# Patient Record
Sex: Female | Born: 1955 | Race: White | Hispanic: Yes | Marital: Married | Smoking: Never smoker
Health system: Southern US, Community
[De-identification: ages and names within clinical notes are randomized; demographics above are authoritative.]

## PROBLEM LIST (undated history)

## (undated) DIAGNOSIS — T8859XA Other complications of anesthesia, initial encounter: Secondary | ICD-10-CM

## (undated) DIAGNOSIS — I10 Essential (primary) hypertension: Secondary | ICD-10-CM

## (undated) DIAGNOSIS — E785 Hyperlipidemia, unspecified: Secondary | ICD-10-CM

## (undated) DIAGNOSIS — M8580 Other specified disorders of bone density and structure, unspecified site: Secondary | ICD-10-CM

## (undated) DIAGNOSIS — M1990 Unspecified osteoarthritis, unspecified site: Secondary | ICD-10-CM

## (undated) DIAGNOSIS — H3530 Unspecified macular degeneration: Secondary | ICD-10-CM

## (undated) DIAGNOSIS — T4145XA Adverse effect of unspecified anesthetic, initial encounter: Secondary | ICD-10-CM

## (undated) HISTORY — DX: Unspecified macular degeneration: H35.30

## (undated) HISTORY — DX: Other specified disorders of bone density and structure, unspecified site: M85.80

## (undated) HISTORY — PX: BUNIONECTOMY: SHX129

## (undated) HISTORY — DX: Unspecified osteoarthritis, unspecified site: M19.90

## (undated) HISTORY — DX: Hyperlipidemia, unspecified: E78.5

## (undated) HISTORY — PX: OSTEOTOMY: SHX137

## (undated) HISTORY — DX: Essential (primary) hypertension: I10

---

## 1975-11-18 HISTORY — PX: HERNIA REPAIR: SHX51

## 2012-12-01 ENCOUNTER — Ambulatory Visit (INDEPENDENT_AMBULATORY_CARE_PROVIDER_SITE_OTHER): Payer: BC Managed Care – PPO | Admitting: Family

## 2012-12-01 ENCOUNTER — Telehealth: Payer: Self-pay | Admitting: Family

## 2012-12-01 ENCOUNTER — Encounter: Payer: Self-pay | Admitting: Family

## 2012-12-01 VITALS — BP 120/80 | HR 64 | Temp 98.3°F | Resp 16 | Ht 64.0 in | Wt 155.0 lb

## 2012-12-01 DIAGNOSIS — D219 Benign neoplasm of connective and other soft tissue, unspecified: Secondary | ICD-10-CM

## 2012-12-01 DIAGNOSIS — M1990 Unspecified osteoarthritis, unspecified site: Secondary | ICD-10-CM | POA: Insufficient documentation

## 2012-12-01 DIAGNOSIS — I10 Essential (primary) hypertension: Secondary | ICD-10-CM | POA: Insufficient documentation

## 2012-12-01 DIAGNOSIS — E785 Hyperlipidemia, unspecified: Secondary | ICD-10-CM | POA: Insufficient documentation

## 2012-12-01 DIAGNOSIS — D259 Leiomyoma of uterus, unspecified: Secondary | ICD-10-CM | POA: Insufficient documentation

## 2012-12-01 LAB — BASIC METABOLIC PANEL WITH GFR
BUN: 23 mg/dL (ref 6–23)
CO2: 26 mEq/L (ref 19–32)
Calcium: 9.7 mg/dL (ref 8.4–10.5)
Chloride: 103 mEq/L (ref 96–112)
Creat: 0.88 mg/dL (ref 0.50–1.10)
GFR, Est African American: 84 mL/min
GFR, Est Non African American: 73 mL/min
Glucose, Bld: 86 mg/dL (ref 70–99)
Potassium: 4.6 mEq/L (ref 3.5–5.3)
Sodium: 138 mEq/L (ref 135–145)

## 2012-12-01 LAB — HEPATIC FUNCTION PANEL
ALT: 12 U/L (ref 0–35)
AST: 15 U/L (ref 0–37)
Albumin: 4.3 g/dL (ref 3.5–5.2)
Alkaline Phosphatase: 54 U/L (ref 39–117)
Bilirubin, Direct: 0.1 mg/dL (ref 0.0–0.3)
Indirect Bilirubin: 0.2 mg/dL (ref 0.0–0.9)
Total Bilirubin: 0.3 mg/dL (ref 0.3–1.2)
Total Protein: 6.8 g/dL (ref 6.0–8.3)

## 2012-12-01 MED ORDER — LISINOPRIL 20 MG PO TABS: 20.0000 mg | ORAL_TABLET | Freq: Every day | ORAL | Status: DC

## 2012-12-01 MED ORDER — ESTRADIOL-NORETHINDRONE ACET 1-0.5 MG PO TABS: 1.0000 | ORAL_TABLET | Freq: Every day | ORAL | Status: DC

## 2012-12-01 NOTE — Assessment & Plan Note (Signed)
Tolerating statin- obtain lft.  Check flp next visit when fasting.

## 2012-12-01 NOTE — Assessment & Plan Note (Addendum)
BP stable on lisinopril. Obtain bmet, continue same.

## 2012-12-01 NOTE — Telephone Encounter (Signed)
Pls call pt and let her know that I have sent Mimvey rx to her pharmacy.  I would like to arrange for her to establish with local GYN due to hx of fibroid uterus and for ongoing management of her hormone replacement.

## 2012-12-01 NOTE — Assessment & Plan Note (Signed)
D/C BC powder and aleve.  Recommend tylenol arthritis prn.  Refer to ortho due to hip pain.

## 2012-12-01 NOTE — Patient Instructions (Addendum)
Please complete your lab work prior to leaving. You will be contact about your referral to orthopedics.   Please let us know if you have not heard back within 1 week about your referral. Please stop BC and aleve. You may use tylenol arthritis as needed. Schedule a fasting physical at your convenience. Welcome to [facility]!

## 2012-12-01 NOTE — Assessment & Plan Note (Signed)
Will refer to GYN for ongoing monitoring and HRT.  See phone note.

## 2012-12-01 NOTE — Assessment & Plan Note (Signed)
>>  ASSESSMENT AND PLAN FOR OSTEOARTHRITIS WRITTEN ON 12/01/2012 10:06 PM BY O'SULLIVAN, Malina Geers, NP  D/C BC powder and aleve .  Recommend tylenol  arthritis prn.  Refer to ortho due to hip pain.

## 2012-12-01 NOTE — Progress Notes (Signed)
Subjective:    Patient ID: Melissa Grimes, female    DOB: [birth year], 57 y.o.   MRN: [id number]  HPI  [name] is a 57 yr old female who presents today to establish care. Moved from [place] in September.  Husband's job.   1) HTN- currently maintained on lisinopril. Reports that she has been treated for HTN for 8-9 years.   2) Hyperlipidemia- currently on pravastatin. Not sure of dose.  She has been on this x 1 year.  Denies myalgia.  Had myalgia on simvastatin.   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  She has seen ortho about 2 yrs ago.  Was told to take aleve.  In the last year symptoms have worsened.  Worse with weight bearing. Does some exercises recommended by PT.    4) Fibroid Uterus-  Reports that the last 2 years she has every 3 months she develops severe cramping x 4 days.  She did have episode of menstrual bleeding 18 months ago. She had D and C and was told that she had fibroid tumor.  She uses naproxen prn.  Sometimes uses BC powder.   Review of Systems  Constitutional: Negative for unexpected weight change.  HENT: Negative for hearing loss.   Eyes: Negative for visual disturbance.  Respiratory: Negative for cough and shortness of breath.   Cardiovascular: Negative for chest pain.  Gastrointestinal: Negative for nausea, vomiting, diarrhea and constipation.  Genitourinary: Negative for dysuria and frequency.  Musculoskeletal: Positive for arthralgias.  Skin: Negative for rash.  Neurological: Negative for headaches.  Hematological: Negative for adenopathy.  Psychiatric/Behavioral:       Denies depression/anxiety   Past Medical History  Diagnosis Date  . Arthritis   . Hyperlipidemia   . Hypertension     History   Social History  . Marital Status: Married    Spouse Name: N/A    Number of Children: 2  . Years of Education: N/A   Occupational History  . Not on file.   Social History Main Topics  . Smoking status: Never Smoker   . Smokeless tobacco: Never Used  .  Alcohol Use: 4.2 oz/week    7 Glasses of wine per week  . Drug Use: Not on file  . Sexually Active: Not on file   Other Topics Concern  . 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 Osteotomy 1974 & 1975  . Cesarean section     x 2  . Hernia repair 1977    inguinal    Family History  Problem Relation Age of Onset  . Arthritis Mother     osateoarthritis  . Hyperlipidemia Mother   . Heart disease Mother   . Hypertension Mother   . Hyperlipidemia Father   . Heart disease Father   . Stroke Father   . Parkinson's disease Father   . Cancer Maternal Grandfather     lung  . Cancer Cousin     breast    Allergies  Allergen Reactions  . Other Hives    ACRIMYACIN? (abx) Pt states she is able to take erythromycins.    Current Outpatient Prescriptions on File Prior to Visit  Medication Sig Dispense Refill  . Calcium Citrate-Vitamin D (CITRACAL + D PO) Take 2 tablets by mouth 2 (two) times daily.      Marland Kitchen lisinopril (  PRINIVIL,ZESTRIL) 20 MG tablet Take 1 tablet (20 mg total) by mouth daily.  30 tablet  5  . MIMVEY 1-0.5 MG per tablet Take 1 tablet by mouth daily.      Marland Kitchen PRAVASTATIN SODIUM PO Take 1 tablet by mouth every evening.        BP 120/80  Pulse 64  Temp 98.3 F (36.8 C) (Oral)  Resp 16  Ht 5\' 4"  (1.626 m)  Wt 155 lb (70.308 kg)  BMI 26.61 kg/m2  SpO2 98%  LMP 11/17/2010       Objective:   Physical Exam  Constitutional: She is oriented to person, place, and time. She appears well-developed and well-nourished. No distress.  HENT:  Head: Normocephalic and atraumatic.  Eyes: No scleral icterus.  Cardiovascular: Normal rate and regular rhythm.   No murmur heard. Pulmonary/Chest: Effort normal and breath sounds normal. No respiratory distress. She has no wheezes. She has no rales.  She exhibits no tenderness.  Abdominal: Soft. Bowel sounds are normal.  Musculoskeletal: She exhibits no edema.  Neurological: She is alert and oriented to person, place, and time.  Skin: Skin is warm and dry.  Psychiatric: She has a normal mood and affect. Her behavior is normal. Judgment and thought content normal.          Assessment & Plan:

## 2012-12-03 ENCOUNTER — Telehealth: Payer: Self-pay | Admitting: *Deleted

## 2012-12-03 NOTE — Telephone Encounter (Signed)
Notified pt and she states she is in the process of establishing with local GYN; referral cancelled.

## 2012-12-03 NOTE — Telephone Encounter (Signed)
Received call from pt wanting to update her medication list. States she is taking pravastatin 20mg  at bedtime. Does not need refill at this time as she still has 4 refills left on her previous Rx. Med list updated.

## 2012-12-08 ENCOUNTER — Telehealth: Payer: Self-pay | Admitting: Family

## 2012-12-08 NOTE — Telephone Encounter (Signed)
Received medical records from [facility]   P: [phone] F: [phone]

## 2012-12-14 ENCOUNTER — Telehealth: Payer: Self-pay | Admitting: Family

## 2012-12-14 NOTE — Telephone Encounter (Signed)
Please call pt and let her know that I received a request for surgical clearance from [facility] ortho.  She should schedule a visit for pre-op clearance please.

## 2012-12-15 NOTE — Telephone Encounter (Signed)
Patient states that she does not have a set date for surgery yet. She is keeping the appointment for 12/22/12.

## 2012-12-22 ENCOUNTER — Encounter: Payer: Self-pay | Admitting: Family

## 2012-12-22 ENCOUNTER — Ambulatory Visit (INDEPENDENT_AMBULATORY_CARE_PROVIDER_SITE_OTHER): Payer: BC Managed Care – PPO | Admitting: Family

## 2012-12-22 ENCOUNTER — Ambulatory Visit (HOSPITAL_BASED_OUTPATIENT_CLINIC_OR_DEPARTMENT_OTHER)
Admission: RE | Admit: 2012-12-22 | Discharge: 2012-12-22 | Disposition: A | Discharge status: Home / Self Care | Payer: BC Managed Care – PPO | Source: Ambulatory Visit | Attending: Family | Admitting: Family

## 2012-12-22 VITALS — BP 114/76 | HR 66 | Temp 98.5°F | Resp 16 | Ht 64.0 in | Wt 153.0 lb

## 2012-12-22 DIAGNOSIS — M1990 Unspecified osteoarthritis, unspecified site: Secondary | ICD-10-CM

## 2012-12-22 DIAGNOSIS — I10 Essential (primary) hypertension: Secondary | ICD-10-CM | POA: Insufficient documentation

## 2012-12-22 DIAGNOSIS — Z0000 Encounter for general adult medical examination without abnormal findings: Secondary | ICD-10-CM | POA: Insufficient documentation

## 2012-12-22 DIAGNOSIS — Z23 Encounter for immunization: Secondary | ICD-10-CM

## 2012-12-22 DIAGNOSIS — Z01818 Encounter for other preprocedural examination: Secondary | ICD-10-CM

## 2012-12-22 DIAGNOSIS — E785 Hyperlipidemia, unspecified: Secondary | ICD-10-CM | POA: Insufficient documentation

## 2012-12-22 LAB — CBC WITH DIFFERENTIAL/PLATELET
Basophils Absolute: 0.1 10*3/uL (ref 0.0–0.1)
Basophils Relative: 1 % (ref 0–1)
Eosinophils Absolute: 0.2 10*3/uL (ref 0.0–0.7)
Eosinophils Relative: 3 % (ref 0–5)
HCT: 41.6 % (ref 36.0–46.0)
Hemoglobin: 14.2 g/dL (ref 12.0–15.0)
Lymphocytes Relative: 30 % (ref 12–46)
Lymphs Abs: 1.8 10*3/uL (ref 0.7–4.0)
MCH: 31.7 pg (ref 26.0–34.0)
MCHC: 34.1 g/dL (ref 30.0–36.0)
MCV: 92.9 fL (ref 78.0–100.0)
Monocytes Absolute: 0.3 10*3/uL (ref 0.1–1.0)
Monocytes Relative: 5 % (ref 3–12)
Neutro Abs: 3.7 10*3/uL (ref 1.7–7.7)
Neutrophils Relative %: 61 % (ref 43–77)
Platelets: 362 10*3/uL (ref 150–400)
RBC: 4.48 MIL/uL (ref 3.87–5.11)
RDW: 12.7 % (ref 11.5–15.5)
WBC: 6.1 10*3/uL (ref 4.0–10.5)

## 2012-12-22 LAB — LIPID PANEL
Cholesterol: 189 mg/dL (ref 0–200)
HDL: 56 mg/dL (ref >39)
LDL Cholesterol: 104 mg/dL — ABNORMAL HIGH (ref 0–99)
Total CHOL/HDL Ratio: 3.4 Ratio
Triglycerides: 144 mg/dL (ref <150)
VLDL: 29 mg/dL (ref 0–40)

## 2012-12-22 LAB — TSH: TSH: 1.501 u[IU]/mL (ref 0.350–4.500)

## 2012-12-22 LAB — PROTIME-INR
INR: 0.93 (ref <1.50)
Prothrombin Time: 12.5 seconds (ref 11.6–15.2)

## 2012-12-22 NOTE — Progress Notes (Signed)
Subjective:    Patient ID: Melissa Grimes, female    DOB: 1956-11-15, 57 y.o.   MRN: [id number]  HPI  Ms.  Lahm is  A 57 yr old female who presents today for fasting physical and pre-operative clearance for R THR.    Immunizations: Not sure of last tetanus but thinks it is up to date.  Diet: reports healthy diet Exercise: She continues to ride bike and do some exercises from PT.   Colonoscopy: 2008- normal.  Pt apparently had complications following colo and was told "never to do again." Dexa: last 5 yrs ago Pap Smear: 5/13 Mammogram: 5/13  Review of Systems  Constitutional: Negative for unexpected weight change.  HENT: Negative for hearing loss.   Eyes: Negative for visual disturbance.  Respiratory: Negative for cough.   Cardiovascular: Negative for chest pain and leg swelling.  Gastrointestinal: Negative for nausea, vomiting and diarrhea.  Genitourinary: Negative for dysuria and frequency.  Musculoskeletal: Positive for arthralgias.  Skin: Negative for rash.  Neurological: Negative for headaches.  Hematological: Negative for adenopathy. Does not bruise/bleed easily.  Psychiatric/Behavioral:       Denies depression   Past Medical History  Diagnosis Date  . Arthritis   . Hyperlipidemia   . Hypertension     History   Social History  . Marital Status: Married    Spouse Name: N/A    Number of Children: 2  . Years of Education: N/A   Occupational History  . Not on file.   Social History Main Topics  . Smoking status: Never Smoker   . Smokeless tobacco: Never Used  . Alcohol Use: 4.2 oz/week    7 Glasses of wine per week  . Drug Use: Not on file  . Sexually Active: Not on file   Other Topics Concern  . 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 Osteotomy 1974 & 1975  .  Cesarean section     x 2  . Hernia repair 1977    inguinal    Family History  Problem Relation Age of Onset  . Arthritis Mother     osateoarthritis  . Hyperlipidemia Mother   . Heart disease Mother   . Hypertension Mother   . Hyperlipidemia Father   . Heart disease Father   . Stroke Father   . Parkinson's disease Father   . Cancer Maternal Grandfather     lung  . Cancer Cousin     breast    Allergies  Allergen Reactions  . Other Hives    ACRIMYACIN? (abx) Pt states she is able to take erythromycins.    Current Outpatient Prescriptions on File Prior to Visit  Medication Sig Dispense Refill  . Calcium Citrate-Vitamin D (CITRACAL + D PO) Take 2 tablets by mouth 2 (two) times daily.      Marland Kitchen estradiol-norethindrone (MIMVEY) 1-0.5 MG per tablet Take 1 tablet by mouth daily.  30 tablet  5  . Lactobacillus (ACIDOPHILUS PO) Take 1 capsule by mouth daily.      Marland Kitchen lisinopril (PRINIVIL,ZESTRIL) 20 MG tablet Take 1 tablet (20 mg total) by mouth daily.  30 tablet  5  . Misc Natural Products (TART CHERRY ADVANCED) CAPS Take 3 capsules by mouth daily.      . Multiple Vitamins-Minerals (CENTRUM SILVER ADULT 50+) TABS Take 1 tablet by mouth  daily.      . Omega-3 Fatty Acids (FISH OIL) 1200 MG CAPS Take 1 capsule by mouth daily.      Marland Kitchen PRAVASTATIN SODIUM PO Take 20 mg by mouth at bedtime.         BP 114/76  Pulse 66  Temp 98.5 F (36.9 C) (Oral)  Resp 16  Ht 5\' 4"  (1.626 m)  Wt 153 lb 0.6 oz (69.418 kg)  BMI 26.27 kg/m2  SpO2 97%  LMP 11/17/2010        Objective:   Physical Exam Physical Exam  Constitutional: She is oriented to person, place, and time. She appears well-developed and well-nourished. No distress.  HENT:  Head: Normocephalic and atraumatic.  Right Ear: Tympanic membrane and ear canal normal.  Left Ear: Tympanic membrane and ear canal normal.  Mouth/Throat: Oropharynx is clear and moist.  Eyes: Pupils are equal, round, and reactive to light. No scleral  icterus.  Neck: Normal range of motion. No thyromegaly present.  Cardiovascular: Normal rate and regular rhythm.   No murmur heard. Pulmonary/Chest: Effort normal and breath sounds normal. No respiratory distress. He has no wheezes. She has no rales. She exhibits no tenderness.  Abdominal: Soft. Bowel sounds are normal. He exhibits no distension and no mass. There is no tenderness. There is no rebound and no guarding.  Musculoskeletal: She exhibits no edema.  Lymphadenopathy:    She has no cervical adenopathy.  Neurological: She is alert and oriented to person, place, and time. She has normal reflexes. She exhibits normal muscle tone. Coordination normal.  Skin: Skin is warm and dry.  Psychiatric: She has a normal mood and affect. Her behavior is normal. Judgment and thought content normal.  Breast/pelvic- deferred to gyn.          Assessment & Plan:          Assessment & Plan:

## 2012-12-22 NOTE — Assessment & Plan Note (Signed)
Obtain fasting lab work, Tdap today, EKG, dexa.  Continue healthy diet and exercise.

## 2012-12-22 NOTE — Assessment & Plan Note (Signed)
>>  ASSESSMENT AND PLAN FOR OSTEOARTHRITIS WRITTEN ON 12/22/2012  9:44 AM BY O'SULLIVAN, Christopher Glasscock, NP  For R THR.  Obtain CXR, PT/INR in addition to cpx labs to complete pre-operative evaluation.

## 2012-12-22 NOTE — Patient Instructions (Addendum)
Please complete your blood work prior to leaving. Complete chest x ray on the first floor. Schedule bone density at the front desk. Follow up in 6 months.

## 2012-12-22 NOTE — Assessment & Plan Note (Signed)
For R THR.  Obtain CXR, PT/INR in addition to cpx labs to complete pre-operative evaluation.

## 2012-12-23 ENCOUNTER — Telehealth: Payer: Self-pay | Admitting: Family

## 2012-12-23 ENCOUNTER — Encounter: Payer: Self-pay | Admitting: Family

## 2012-12-23 LAB — URINALYSIS, ROUTINE W REFLEX MICROSCOPIC
Bilirubin Urine: NEGATIVE
Glucose, UA: NEGATIVE mg/dL
Hgb urine dipstick: NEGATIVE
Ketones, ur: NEGATIVE mg/dL
Leukocytes, UA: NEGATIVE
Nitrite: NEGATIVE
Protein, ur: NEGATIVE mg/dL
Specific Gravity, Urine: 1.016 (ref 1.005–1.030)
Urobilinogen, UA: 0.2 mg/dL (ref 0.0–1.0)
pH: 7.5 (ref 5.0–8.0)

## 2012-12-23 NOTE — Telephone Encounter (Signed)
Attempted to reach pt and received message that voice mailbox has not been set up. Form faxed.

## 2012-12-23 NOTE — Telephone Encounter (Signed)
Pt is cleared for surgery. Please let her know and fax clearance to ortho.

## 2012-12-24 NOTE — Telephone Encounter (Signed)
Left detailed message on pt's cell# re: from completion and to call if any questions.

## 2012-12-27 ENCOUNTER — Other Ambulatory Visit (HOSPITAL_COMMUNITY): Payer: Self-pay | Admitting: Orthopaedic Surgery

## 2012-12-28 ENCOUNTER — Other Ambulatory Visit: Payer: BC Managed Care – PPO

## 2012-12-28 LAB — FECAL OCCULT BLOOD, IMMUNOCHEMICAL: Fecal Occult Bld: NEGATIVE

## 2012-12-29 ENCOUNTER — Encounter: Payer: Self-pay | Admitting: Family

## 2013-01-07 ENCOUNTER — Encounter (HOSPITAL_COMMUNITY): Payer: Self-pay | Admitting: Pharmacy Technician

## 2013-01-11 ENCOUNTER — Encounter (HOSPITAL_COMMUNITY): Payer: Self-pay

## 2013-01-11 ENCOUNTER — Encounter (HOSPITAL_COMMUNITY)
Admission: RE | Admit: 2013-01-11 | Discharge: 2013-01-11 | Disposition: A | Discharge status: Home / Self Care | Payer: BC Managed Care – PPO | Source: Ambulatory Visit | Attending: Orthopaedic Surgery | Admitting: Orthopaedic Surgery

## 2013-01-11 LAB — BASIC METABOLIC PANEL
BUN: 17 mg/dL (ref 6–23)
CO2: 25 mEq/L (ref 19–32)
Calcium: 9.2 mg/dL (ref 8.4–10.5)
Chloride: 103 mEq/L (ref 96–112)
Creatinine, Ser: 0.64 mg/dL (ref 0.50–1.10)
GFR calc Af Amer: >90 mL/min (ref >90)
GFR calc non Af Amer: >90 mL/min (ref >90)
Glucose, Bld: 94 mg/dL (ref 70–99)
Potassium: 3.6 mEq/L (ref 3.5–5.1)
Sodium: 139 mEq/L (ref 135–145)

## 2013-01-11 LAB — SURGICAL PCR SCREEN
MRSA, PCR: NEGATIVE
Staphylococcus aureus: NEGATIVE

## 2013-01-11 LAB — URINALYSIS, ROUTINE W REFLEX MICROSCOPIC
Bilirubin Urine: NEGATIVE
Glucose, UA: NEGATIVE mg/dL
Ketones, ur: NEGATIVE mg/dL
Leukocytes, UA: NEGATIVE
Nitrite: NEGATIVE
Protein, ur: NEGATIVE mg/dL
Specific Gravity, Urine: 1.019 (ref 1.005–1.030)
Urobilinogen, UA: 0.2 mg/dL (ref 0.0–1.0)
pH: 5.5 (ref 5.0–8.0)

## 2013-01-11 LAB — TYPE AND SCREEN
ABO/RH(D): A POS
Antibody Screen: NEGATIVE

## 2013-01-11 LAB — URINE MICROSCOPIC-ADD ON

## 2013-01-11 LAB — APTT: aPTT: 28 seconds (ref 24–37)

## 2013-01-11 LAB — CBC
HCT: 40.3 % (ref 36.0–46.0)
Hemoglobin: 14.4 g/dL (ref 12.0–15.0)
MCH: 33.1 pg (ref 26.0–34.0)
MCHC: 35.7 g/dL (ref 30.0–36.0)
MCV: 92.6 fL (ref 78.0–100.0)
Platelets: 326 10*3/uL (ref 150–400)
RBC: 4.35 MIL/uL (ref 3.87–5.11)
RDW: 12.4 % (ref 11.5–15.5)
WBC: 7.7 10*3/uL (ref 4.0–10.5)

## 2013-01-11 LAB — PROTIME-INR
INR: 1.04 (ref 0.00–1.49)
Prothrombin Time: 13.5 seconds (ref 11.6–15.2)

## 2013-01-11 LAB — ABO/RH: ABO/RH(D): A POS

## 2013-01-11 NOTE — Pre-Procedure Instructions (Signed)
Melissa Grimes  01/11/2013   Your procedure is scheduled on:  Tuesday, January 18, 2013  Report to [facility] at 10:45 AM.  Call this number if you have problems the morning of surgery: [phone]   Remember:   Do not eat food or drink liquids after midnight.   Take these medicines the morning of surgery with A SIP OF WATER: estradiol-norethindrone   Do not wear jewelry, make-up or nail polish.  Do not wear lotions, powders, or perfumes. You may wear deodorant.  Do not shave 48 hours prior to surgery. Men may shave face and neck.  Do not bring valuables to the hospital.  Contacts, dentures or bridgework may not be worn into surgery.  Leave suitcase in the car. After surgery it may be brought to your room.  For patients admitted to the hospital, checkout time is 11:00 AM the day of  discharge.   Patients discharged the day of surgery will not be allowed to drive  home.  Name and phone number of your driver:   Special Instructions: Shower using CHG 2 nights before surgery and the night before surgery.  If you shower the day of surgery use CHG.  Use special wash - you have one bottle of CHG for all showers.  You should use approximately 1/3 of the bottle for each shower.   Please read over the following fact sheets that you were given: Pain Booklet, Coughing and Deep Breathing, Blood Transfusion Information, MRSA Information and Surgical Site Infection Prevention

## 2013-01-17 MED ORDER — CEFAZOLIN SODIUM-DEXTROSE 2-3 GM-% IV SOLR
2.0000 g | INTRAVENOUS | Status: AC
  Administered 2013-01-18: 2 g via INTRAVENOUS
  Filled 2013-01-17: qty 50

## 2013-01-18 ENCOUNTER — Encounter (HOSPITAL_COMMUNITY): Payer: Self-pay | Admitting: Anesthesiology

## 2013-01-18 ENCOUNTER — Inpatient Hospital Stay (HOSPITAL_COMMUNITY): Payer: BC Managed Care – PPO

## 2013-01-18 ENCOUNTER — Encounter (HOSPITAL_COMMUNITY)
Admission: RE | Disposition: A | Discharge status: Home Health | Payer: Self-pay | Source: Ambulatory Visit | Attending: Orthopaedic Surgery

## 2013-01-18 ENCOUNTER — Encounter (HOSPITAL_COMMUNITY): Payer: Self-pay | Admitting: Surgery

## 2013-01-18 ENCOUNTER — Inpatient Hospital Stay (HOSPITAL_COMMUNITY)
Admission: RE | Admit: 2013-01-18 | Discharge: 2013-01-22 | DRG: 818 | Disposition: A | Discharge status: Home Health | Payer: BC Managed Care – PPO | Source: Ambulatory Visit | Attending: Orthopaedic Surgery | Admitting: Orthopaedic Surgery

## 2013-01-18 ENCOUNTER — Ambulatory Visit (HOSPITAL_COMMUNITY): Payer: BC Managed Care – PPO | Admitting: Anesthesiology

## 2013-01-18 ENCOUNTER — Ambulatory Visit (HOSPITAL_COMMUNITY): Payer: BC Managed Care – PPO

## 2013-01-18 DIAGNOSIS — D259 Leiomyoma of uterus, unspecified: Secondary | ICD-10-CM | POA: Diagnosis present

## 2013-01-18 DIAGNOSIS — E785 Hyperlipidemia, unspecified: Secondary | ICD-10-CM | POA: Diagnosis present

## 2013-01-18 DIAGNOSIS — Z01812 Encounter for preprocedural laboratory examination: Secondary | ICD-10-CM

## 2013-01-18 DIAGNOSIS — I10 Essential (primary) hypertension: Secondary | ICD-10-CM | POA: Diagnosis present

## 2013-01-18 DIAGNOSIS — Z79899 Other long term (current) drug therapy: Secondary | ICD-10-CM

## 2013-01-18 DIAGNOSIS — Z7982 Long term (current) use of aspirin: Secondary | ICD-10-CM

## 2013-01-18 DIAGNOSIS — M1610 Unilateral primary osteoarthritis, unspecified hip: Principal | ICD-10-CM | POA: Diagnosis not present

## 2013-01-18 DIAGNOSIS — M169 Osteoarthritis of hip, unspecified: Secondary | ICD-10-CM

## 2013-01-18 DIAGNOSIS — D62 Acute posthemorrhagic anemia: Secondary | ICD-10-CM | POA: Diagnosis present

## 2013-01-18 HISTORY — PX: TOTAL HIP ARTHROPLASTY: SHX124

## 2013-01-18 SURGERY — ARTHROPLASTY, HIP, TOTAL, ANTERIOR APPROACH
Anesthesia: General | Laterality: Right | Wound class: Clean

## 2013-01-18 MED ORDER — HYDROMORPHONE HCL PF 1 MG/ML IJ SOLN
0.2500 mg | INTRAMUSCULAR | Status: DC | PRN
  Administered 2013-01-18 (×2): 0.5 mg via INTRAVENOUS

## 2013-01-18 MED ORDER — METHOCARBAMOL 500 MG PO TABS
500.0000 mg | ORAL_TABLET | Freq: Four times a day (QID) | ORAL | Status: DC | PRN
  Administered 2013-01-18 – 2013-01-22 (×10): 500 mg via ORAL
  Filled 2013-01-18 (×10): qty 1

## 2013-01-18 MED ORDER — EPHEDRINE SULFATE 50 MG/ML IJ SOLN
INTRAMUSCULAR | Status: DC | PRN
  Administered 2013-01-18 (×2): 5 mg via INTRAVENOUS

## 2013-01-18 MED ORDER — NEOSTIGMINE METHYLSULFATE 1 MG/ML IJ SOLN
INTRAMUSCULAR | Status: DC | PRN
  Administered 2013-01-18: 4 mg via INTRAVENOUS

## 2013-01-18 MED ORDER — ONDANSETRON HCL 4 MG/2ML IJ SOLN
INTRAMUSCULAR | Status: DC | PRN
  Administered 2013-01-18: 4 mg via INTRAVENOUS

## 2013-01-18 MED ORDER — DOCUSATE SODIUM 100 MG PO CAPS
100.0000 mg | ORAL_CAPSULE | Freq: Two times a day (BID) | ORAL | Status: DC
  Administered 2013-01-18 – 2013-01-22 (×8): 100 mg via ORAL
  Filled 2013-01-18 (×10): qty 1

## 2013-01-18 MED ORDER — ONDANSETRON HCL 4 MG PO TABS: 4.0000 mg | ORAL_TABLET | Freq: Four times a day (QID) | ORAL | Status: DC | PRN

## 2013-01-18 MED ORDER — LACTATED RINGERS IV SOLN
INTRAVENOUS | Status: DC
  Administered 2013-01-18: 12:00:00 via INTRAVENOUS

## 2013-01-18 MED ORDER — OXYCODONE HCL 5 MG PO TABS: 5.0000 mg | ORAL_TABLET | Freq: Once | ORAL | Status: DC | PRN

## 2013-01-18 MED ORDER — ROCURONIUM BROMIDE 100 MG/10ML IV SOLN
INTRAVENOUS | Status: DC | PRN
  Administered 2013-01-18: 50 mg via INTRAVENOUS

## 2013-01-18 MED ORDER — FENTANYL CITRATE 0.05 MG/ML IJ SOLN
INTRAMUSCULAR | Status: DC | PRN
  Administered 2013-01-18: 50 ug via INTRAVENOUS
  Administered 2013-01-18 (×3): 100 ug via INTRAVENOUS

## 2013-01-18 MED ORDER — SODIUM CHLORIDE 0.9 % IR SOLN
Status: DC | PRN
  Administered 2013-01-18: 1000 mL

## 2013-01-18 MED ORDER — HYDROMORPHONE HCL PF 1 MG/ML IJ SOLN
INTRAMUSCULAR | Status: AC
  Filled 2013-01-18: qty 1

## 2013-01-18 MED ORDER — ACETAMINOPHEN 325 MG PO TABS
650.0000 mg | ORAL_TABLET | Freq: Four times a day (QID) | ORAL | Status: DC | PRN
  Administered 2013-01-19: 650 mg via ORAL
  Filled 2013-01-18: qty 2

## 2013-01-18 MED ORDER — SODIUM CHLORIDE 0.9 % IV SOLN
INTRAVENOUS | Status: DC
  Administered 2013-01-18: via INTRAVENOUS

## 2013-01-18 MED ORDER — METOCLOPRAMIDE HCL 5 MG/ML IJ SOLN: 5.0000 mg | Freq: Three times a day (TID) | INTRAMUSCULAR | Status: DC | PRN

## 2013-01-18 MED ORDER — LACTATED RINGERS IV SOLN
INTRAVENOUS | Status: DC | PRN
  Administered 2013-01-18 (×3): via INTRAVENOUS

## 2013-01-18 MED ORDER — GLYCOPYRROLATE 0.2 MG/ML IJ SOLN
INTRAMUSCULAR | Status: DC | PRN
  Administered 2013-01-18: 0.6 mg via INTRAVENOUS

## 2013-01-18 MED ORDER — OXYCODONE HCL ER 10 MG PO T12A
20.0000 mg | EXTENDED_RELEASE_TABLET | Freq: Two times a day (BID) | ORAL | Status: DC
  Administered 2013-01-18 – 2013-01-19 (×2): 20 mg via ORAL
  Filled 2013-01-18 (×2): qty 2

## 2013-01-18 MED ORDER — PROMETHAZINE HCL 25 MG/ML IJ SOLN
6.2500 mg | INTRAMUSCULAR | Status: DC | PRN
  Administered 2013-01-18: 6.25 mg via INTRAVENOUS

## 2013-01-18 MED ORDER — ONDANSETRON HCL 4 MG/2ML IJ SOLN
4.0000 mg | Freq: Four times a day (QID) | INTRAMUSCULAR | Status: DC | PRN
  Administered 2013-01-19: 4 mg via INTRAVENOUS
  Filled 2013-01-18: qty 2

## 2013-01-18 MED ORDER — OXYCODONE HCL 5 MG PO TABS
5.0000 mg | ORAL_TABLET | ORAL | Status: DC | PRN
  Administered 2013-01-19 – 2013-01-22 (×11): 10 mg via ORAL
  Filled 2013-01-18 (×14): qty 2

## 2013-01-18 MED ORDER — DIPHENHYDRAMINE HCL 12.5 MG/5ML PO ELIX: 12.5000 mg | ORAL_SOLUTION | ORAL | Status: DC | PRN

## 2013-01-18 MED ORDER — METHOCARBAMOL 100 MG/ML IJ SOLN: 500.0000 mg | Freq: Four times a day (QID) | INTRAVENOUS | Status: DC | PRN

## 2013-01-18 MED ORDER — ALUM & MAG HYDROXIDE-SIMETH 200-200-20 MG/5ML PO SUSP: 30.0000 mL | ORAL | Status: DC | PRN

## 2013-01-18 MED ORDER — LIDOCAINE HCL 4 % MT SOLN
OROMUCOSAL | Status: DC | PRN
  Administered 2013-01-18: 4 mL via TOPICAL

## 2013-01-18 MED ORDER — ASPIRIN EC 325 MG PO TBEC
325.0000 mg | DELAYED_RELEASE_TABLET | Freq: Two times a day (BID) | ORAL | Status: DC
  Administered 2013-01-19 – 2013-01-22 (×7): 325 mg via ORAL
  Filled 2013-01-18 (×9): qty 1

## 2013-01-18 MED ORDER — 0.9 % SODIUM CHLORIDE (POUR BTL) OPTIME
TOPICAL | Status: DC | PRN
  Administered 2013-01-18: 1000 mL

## 2013-01-18 MED ORDER — ACETAMINOPHEN 650 MG RE SUPP: 650.0000 mg | Freq: Four times a day (QID) | RECTAL | Status: DC | PRN

## 2013-01-18 MED ORDER — METOCLOPRAMIDE HCL 10 MG PO TABS: 5.0000 mg | ORAL_TABLET | Freq: Three times a day (TID) | ORAL | Status: DC | PRN

## 2013-01-18 MED ORDER — KETOROLAC TROMETHAMINE 15 MG/ML IJ SOLN
15.0000 mg | Freq: Four times a day (QID) | INTRAMUSCULAR | Status: AC
  Administered 2013-01-18 – 2013-01-19 (×4): 15 mg via INTRAVENOUS
  Filled 2013-01-18 (×4): qty 1

## 2013-01-18 MED ORDER — PROPOFOL 10 MG/ML IV BOLUS
INTRAVENOUS | Status: DC | PRN
  Administered 2013-01-18: 100 mg via INTRAVENOUS

## 2013-01-18 MED ORDER — PHENOL 1.4 % MT LIQD: 1.0000 | OROMUCOSAL | Status: DC | PRN

## 2013-01-18 MED ORDER — CEFAZOLIN SODIUM 1-5 GM-% IV SOLN
1.0000 g | Freq: Four times a day (QID) | INTRAVENOUS | Status: AC
  Administered 2013-01-18 – 2013-01-19 (×2): 1 g via INTRAVENOUS
  Filled 2013-01-18 (×3): qty 50

## 2013-01-18 MED ORDER — PROMETHAZINE HCL 25 MG/ML IJ SOLN
INTRAMUSCULAR | Status: AC
  Filled 2013-01-18: qty 1

## 2013-01-18 MED ORDER — MENTHOL 3 MG MT LOZG: 1.0000 | LOZENGE | OROMUCOSAL | Status: DC | PRN

## 2013-01-18 MED ORDER — HYDROMORPHONE HCL PF 1 MG/ML IJ SOLN
1.0000 mg | INTRAMUSCULAR | Status: DC | PRN
  Administered 2013-01-18: 1 mg via INTRAVENOUS
  Filled 2013-01-18: qty 1

## 2013-01-18 MED ORDER — ZOLPIDEM TARTRATE 5 MG PO TABS: 5.0000 mg | ORAL_TABLET | Freq: Every evening | ORAL | Status: DC | PRN

## 2013-01-18 MED ORDER — MIDAZOLAM HCL 5 MG/5ML IJ SOLN
INTRAMUSCULAR | Status: DC | PRN
  Administered 2013-01-18: 2 mg via INTRAVENOUS

## 2013-01-18 MED ORDER — OXYCODONE HCL 5 MG/5ML PO SOLN: 5.0000 mg | Freq: Once | ORAL | Status: DC | PRN

## 2013-01-18 MED ORDER — LIDOCAINE HCL (CARDIAC) 20 MG/ML IV SOLN
INTRAVENOUS | Status: DC | PRN
  Administered 2013-01-18: 100 mg via INTRAVENOUS

## 2013-01-18 MED ORDER — METHOCARBAMOL 500 MG PO TABS
ORAL_TABLET | ORAL | Status: AC
  Filled 2013-01-18: qty 1

## 2013-01-18 SURGICAL SUPPLY — 57 items
BANDAGE GAUZE ELAST BULKY 4 IN (GAUZE/BANDAGES/DRESSINGS) IMPLANT
BLADE SAW SGTL 18X1.27X75 (BLADE) ×2 IMPLANT
BLADE SURG ROTATE 9660 (MISCELLANEOUS) IMPLANT
BNDG COHESIVE 6X5 TAN STRL LF (GAUZE/BANDAGES/DRESSINGS) IMPLANT
CAPT HIP PF COP ×2 IMPLANT
CELLS DAT CNTRL 66122 CELL SVR (MISCELLANEOUS) ×1 IMPLANT
CLOTH BEACON ORANGE TIMEOUT ST (SAFETY) ×2 IMPLANT
COVER BACK TABLE 24X17X13 BIG (DRAPES) IMPLANT
COVER SURGICAL LIGHT HANDLE (MISCELLANEOUS) ×2 IMPLANT
DRAPE C-ARM 42X72 X-RAY (DRAPES) ×2 IMPLANT
DRAPE STERI IOBAN 125X83 (DRAPES) ×2 IMPLANT
DRAPE U-SHAPE 47X51 STRL (DRAPES) ×6 IMPLANT
DRSG AQUACEL AG ADV 3.5X10 (GAUZE/BANDAGES/DRESSINGS) ×2 IMPLANT
DURAPREP 26ML APPLICATOR (WOUND CARE) ×2 IMPLANT
ELECT BLADE 4.0 EZ CLEAN MEGAD (MISCELLANEOUS) ×2
ELECT BLADE TIP CTD 4 INCH (ELECTRODE) ×2 IMPLANT
ELECT CAUTERY BLADE 6.4 (BLADE) ×2 IMPLANT
ELECT REM PT RETURN 9FT ADLT (ELECTROSURGICAL) ×2
ELECTRODE BLDE 4.0 EZ CLN MEGD (MISCELLANEOUS) ×1 IMPLANT
ELECTRODE REM PT RTRN 9FT ADLT (ELECTROSURGICAL) ×1 IMPLANT
FACESHIELD LNG OPTICON STERILE (SAFETY) ×4 IMPLANT
GAUZE XEROFORM 1X8 LF (GAUZE/BANDAGES/DRESSINGS) ×2 IMPLANT
GLOVE BIO SURGEON STRL SZ7.5 (GLOVE) ×2 IMPLANT
GLOVE BIOGEL PI IND STRL 7.5 (GLOVE) ×1 IMPLANT
GLOVE BIOGEL PI IND STRL 8 (GLOVE) ×1 IMPLANT
GLOVE BIOGEL PI INDICATOR 7.5 (GLOVE) ×1
GLOVE BIOGEL PI INDICATOR 8 (GLOVE) ×1
GLOVE ECLIPSE 7.0 STRL STRAW (GLOVE) ×2 IMPLANT
GLOVE ORTHO TXT STRL SZ7.5 (GLOVE) ×2 IMPLANT
GOWN PREVENTION PLUS LG XLONG (DISPOSABLE) IMPLANT
GOWN PREVENTION PLUS XLARGE (GOWN DISPOSABLE) ×2 IMPLANT
GOWN STRL NON-REIN LRG LVL3 (GOWN DISPOSABLE) ×4 IMPLANT
GOWN STRL REIN XL XLG (GOWN DISPOSABLE) ×2 IMPLANT
KIT BASIN OR (CUSTOM PROCEDURE TRAY) ×2 IMPLANT
KIT ROOM TURNOVER OR (KITS) ×2 IMPLANT
MANIFOLD NEPTUNE II (INSTRUMENTS) ×2 IMPLANT
NS IRRIG 1000ML POUR BTL (IV SOLUTION) ×2 IMPLANT
PACK TOTAL JOINT (CUSTOM PROCEDURE TRAY) ×2 IMPLANT
PAD ARMBOARD 7.5X6 YLW CONV (MISCELLANEOUS) ×4 IMPLANT
RTRCTR WOUND ALEXIS 18CM MED (MISCELLANEOUS) ×2
SPONGE LAP 18X18 X RAY DECT (DISPOSABLE) ×2 IMPLANT
SPONGE LAP 4X18 X RAY DECT (DISPOSABLE) IMPLANT
STAPLER SKIN PROX WIDE 3.9 (STAPLE) ×2 IMPLANT
STAPLER VISISTAT 35W (STAPLE) ×2 IMPLANT
SUT ETHIBOND NAB CT1 #1 30IN (SUTURE) ×4 IMPLANT
SUT MON AB 4-0 PC3 18 (SUTURE) ×4 IMPLANT
SUT VIC AB 0 CT1 27 (SUTURE) ×2
SUT VIC AB 0 CT1 27XBRD ANBCTR (SUTURE) ×2 IMPLANT
SUT VIC AB 1 CT1 27 (SUTURE) ×2
SUT VIC AB 1 CT1 27XBRD ANBCTR (SUTURE) ×2 IMPLANT
SUT VIC AB 2-0 CT1 27 (SUTURE) ×2
SUT VIC AB 2-0 CT1 TAPERPNT 27 (SUTURE) ×2 IMPLANT
SUT VLOC 180 0 24IN GS25 (SUTURE) ×2 IMPLANT
TOWEL OR 17X24 6PK STRL BLUE (TOWEL DISPOSABLE) ×2 IMPLANT
TOWEL OR 17X26 10 PK STRL BLUE (TOWEL DISPOSABLE) ×2 IMPLANT
TRAY FOLEY CATH 14FR (SET/KITS/TRAYS/PACK) ×2 IMPLANT
WATER STERILE IRR 1000ML POUR (IV SOLUTION) ×4 IMPLANT

## 2013-01-18 NOTE — Transfer of Care (Signed)
Immediate Anesthesia Transfer of Care Note  Patient: Melissa Grimes  Procedure(s) Performed: Procedure(s): Right TOTAL HIP ARTHROPLASTY ANTERIOR APPROACH (Right)  Patient Location: PACU  Anesthesia Type:General  Level of Consciousness: awake, alert  and patient cooperative  Airway & Oxygen Therapy: Patient Spontanous Breathing and Patient connected to nasal cannula oxygen  Post-op Assessment: Report given to PACU RN, Post -op Vital signs reviewed and stable and Patient moving all extremities X 4  Post vital signs: Reviewed and stable  Complications: No apparent anesthesia complications

## 2013-01-18 NOTE — H&P (Signed)
TOTAL HIP ADMISSION H&P  Patient is admitted for right total hip arthroplasty.  Subjective:  Chief Complaint: right hip pain  HPI: Melissa Grimes, 57 y.o. female, has a history of pain and functional disability in the right hip(s) due to arthritis and patient has failed non-surgical conservative treatments for greater than 12 weeks to include NSAID's and/or analgesics, corticosteriod injections and activity modification.  Onset of symptoms was gradual starting 2 years ago with gradually worsening course since that time.The patient noted no past surgery on the right hip(s).  Patient currently rates pain in the right hip at 10 out of 10 with activity. Patient has night pain, worsening of pain with activity and weight bearing, pain that interfers with activities of daily living, pain with passive range of motion and crepitus. Patient has evidence of subchondral cysts, subchondral sclerosis, periarticular osteophytes and joint space narrowing by imaging studies. This condition presents safety issues increasing the risk of falls.  There is no current active infection.  Patient Active Problem List   Diagnosis Date Noted  . Degenerative arthritis of hip 01/18/2013  . Routine general medical examination at a health care facility 12/22/2012  . HTN (hypertension) 12/01/2012  . Hyperlipidemia 12/01/2012  . Osteoarthritis 12/01/2012  . Fibroid uterus 12/01/2012   Past Medical History  Diagnosis Date  . Arthritis   . Hyperlipidemia   . Hypertension     Past Surgical History  Procedure Laterality Date  . Osteotomy  1974 & 1975  . Cesarean section      x 2  . Hernia repair  1977    inguinal  . Bunionectomy      Prescriptions prior to admission  Medication Sig Dispense Refill  . Aspirin-Caffeine (BC FAST PAIN RELIEF ARTHRITIS PO) Take 100 mg by mouth daily.      . Calcium Citrate-Vitamin D 500-400 MG-UNIT CHEW Chew 2 tablets by mouth 2 (two) times daily.      Marland Kitchen estradiol-norethindrone (MIMVEY)  1-0.5 MG per tablet Take 1 tablet by mouth daily.  30 tablet  5  . Lactobacillus (ACIDOPHILUS) 100 MG CAPS Take 100 mg by mouth daily.      Marland Kitchen lisinopril (PRINIVIL,ZESTRIL) 20 MG tablet Take 1 tablet (20 mg total) by mouth daily.  30 tablet  5  . Misc Natural Products (TART CHERRY ADVANCED) CAPS Take 3 capsules by mouth daily.      . Multiple Vitamin (MULTIVITAMIN WITH MINERALS) TABS Take 1 tablet by mouth daily. Centrum Silver      . naproxen sodium (ANAPROX) 220 MG tablet Take 440 mg by mouth daily as needed (for pain).      . Omega-3 Fatty Acids (FISH OIL) 1200 MG CAPS Take 1 capsule by mouth daily.      . pravastatin (PRAVACHOL) 20 MG tablet Take 20 mg by mouth daily.       Allergies  Allergen Reactions  . Achromycin (Tetracycline) Hives    History  Substance Use Topics  . Smoking status: Never Smoker   . Smokeless tobacco: Never Used  . Alcohol Use: 4.2 oz/week    7 Glasses of wine per week    Family History  Problem Relation Age of Onset  . Arthritis Mother     osateoarthritis  . Hyperlipidemia Mother   . Heart disease Mother   . Hypertension Mother   . Hyperlipidemia Father   . Heart disease Father   . Stroke Father   . Parkinson's disease Father   . Cancer Maternal Grandfather  lung  . Cancer Cousin     breast     Review of Systems  Musculoskeletal: Positive for joint pain.  All other systems reviewed and are negative.    Objective:  Physical Exam  Constitutional: She is oriented to person, place, and time. She appears well-developed and well-nourished.  HENT:  Head: Normocephalic and atraumatic.  Eyes: EOM are normal. Pupils are equal, round, and reactive to light.  Neck: Normal range of motion. Neck supple.  Cardiovascular: Normal rate and regular rhythm.   Respiratory: Effort normal and breath sounds normal.  GI: Soft. Bowel sounds are normal.  Musculoskeletal:       Right hip: She exhibits decreased range of motion, decreased strength, bony  tenderness and crepitus.  Neurological: She is alert and oriented to person, place, and time.  Skin: Skin is warm and dry.  Psychiatric: She has a normal mood and affect.    Vital signs in last 24 hours: Temp:  [97.3 F (36.3 C)] 97.3 F (36.3 C) (03/04 1100) Pulse Rate:  [68] 68 (03/04 1100) Resp:  [18] 18 (03/04 1100) BP: (157)/(97) 157/97 mmHg (03/04 1100) SpO2:  [99 %] 99 % (03/04 1100)  Labs:   Estimated body mass index is 26.26 kg/(m^2) as calculated from the following:   Height as of 01/11/13: 5\' 4"  (1.626 m).   Weight as of 12/22/12: 69.418 kg (153 lb 0.6 oz).   Imaging Review Plain radiographs demonstrate severe degenerative joint disease of the right hip(s). The bone quality appears to be excellent for age and reported activity level.  Assessment/Plan:  End stage arthritis, right hip(s)  The patient history, physical examination, clinical judgement of the provider and imaging studies are consistent with end stage degenerative joint disease of the right hip(s) and total hip arthroplasty is deemed medically necessary. The treatment options including medical management, injection therapy, arthroscopy and arthroplasty were discussed at length. The risks and benefits of total hip arthroplasty were presented and reviewed. The risks due to aseptic loosening, infection, stiffness, dislocation/subluxation,  thromboembolic complications and other imponderables were discussed.  The patient acknowledged the explanation, agreed to proceed with the plan and consent was signed. Patient is being admitted for inpatient treatment for surgery, pain control, PT, OT, prophylactic antibiotics, VTE prophylaxis, progressive ambulation and ADL's and discharge planning.The patient is planning to be discharged home with home health services

## 2013-01-18 NOTE — Anesthesia Postprocedure Evaluation (Signed)
  Anesthesia Post-op Note  Patient: Melissa Grimes  Procedure(s) Performed: Procedure(s): Right TOTAL HIP ARTHROPLASTY ANTERIOR APPROACH (Right)  Patient Location: PACU  Anesthesia Type:General  Level of Consciousness: awake, oriented and patient cooperative  Airway and Oxygen Therapy: Patient Spontanous Breathing  Post-op Pain: none  Post-op Assessment: Post-op Vital signs reviewed, Patient's Cardiovascular Status Stable, Respiratory Function Stable, Patent Airway, No signs of Nausea or vomiting and Pain level controlled  Post-op Vital Signs: stable  Complications: No apparent anesthesia complications

## 2013-01-18 NOTE — Anesthesia Preprocedure Evaluation (Signed)
Anesthesia Evaluation    Reviewed: Allergy & Precautions, H&P , NPO status , Patient's Chart, lab work & pertinent test results  History of Anesthesia Complications Negative for: history of anesthetic complications  Airway       Dental   Pulmonary neg pulmonary ROS,          Cardiovascular hypertension, Pt. on medications     Neuro/Psych negative neurological ROS     GI/Hepatic negative GI ROS, Neg liver ROS,   Endo/Other  negative endocrine ROS  Renal/GU negative Renal ROS     Musculoskeletal   Abdominal   Peds  Hematology negative hematology ROS (+)   Anesthesia Other Findings   Reproductive/Obstetrics                           Anesthesia Physical Anesthesia Plan  ASA: II  Anesthesia Plan: General   Post-op Pain Management:    Induction: Intravenous  Airway Management Planned: Oral ETT  Additional Equipment:   Intra-op Plan:   Post-operative Plan: Extubation in OR  Informed Consent:   Plan Discussed with: CRNA, Anesthesiologist and Surgeon  Anesthesia Plan Comments:         Anesthesia Quick Evaluation

## 2013-01-18 NOTE — Preoperative (Signed)
Beta Blockers   Reason not to administer Beta Blockers:Not Applicable 

## 2013-01-18 NOTE — Brief Op Note (Signed)
01/18/2013  3:00 PM  PATIENT:  Melissa Grimes  57 y.o. female  PRE-OPERATIVE DIAGNOSIS:  Severe osteoarthritis right hip  POST-OPERATIVE DIAGNOSIS:  Severe osteoarthritis right hip  PROCEDURE:  Procedure(s): Right TOTAL HIP ARTHROPLASTY ANTERIOR APPROACH (Right)  SURGEON:  Surgeon(s) and Role:    * Kathryne Hitch, MD - Primary  PHYSICIAN ASSISTANT:   Rexene Edison, PA-C  ASSISTANTS: Rexene Edison, PA-C   ANESTHESIA:   general  EBL:  Total I/O In: 2000 [I.V.:2000] Out: 450 [Urine:150; Blood:300]  BLOOD ADMINISTERED:none  DRAINS: none   LOCAL MEDICATIONS USED:  NONE  SPECIMEN:  No Specimen  DISPOSITION OF SPECIMEN:  N/A  COUNTS:  YES  TOURNIQUET:  * No tourniquets in log *  DICTATION: .Other Dictation: Dictation Number [id number]  PLAN OF CARE: Admit to inpatient   PATIENT DISPOSITION:  PACU - hemodynamically stable.   Delay start of Pharmacological VTE agent (>24hrs) due to surgical blood loss or risk of bleeding: no

## 2013-01-18 NOTE — Progress Notes (Signed)
Orthopedic Tech Progress Note Patient Details:  Melissa Grimes [birth year] [id number] OHF with trapeze bar applied to bed.  Patient ID: Melissa Grimes, female   DOB: 06-Jun-1956, 57 y.o.   MRN: [id number]   Melissa Grimes 01/18/2013, 5:27 PM

## 2013-01-19 ENCOUNTER — Encounter (HOSPITAL_COMMUNITY): Payer: Self-pay | Admitting: Orthopaedic Surgery

## 2013-01-19 LAB — BASIC METABOLIC PANEL
BUN: 8 mg/dL (ref 6–23)
CO2: 25 mEq/L (ref 19–32)
Calcium: 8.1 mg/dL — ABNORMAL LOW (ref 8.4–10.5)
Chloride: 103 mEq/L (ref 96–112)
Creatinine, Ser: 0.65 mg/dL (ref 0.50–1.10)
GFR calc Af Amer: >90 mL/min (ref >90)
GFR calc non Af Amer: >90 mL/min (ref >90)
Glucose, Bld: 105 mg/dL — ABNORMAL HIGH (ref 70–99)
Potassium: 3.4 mEq/L — ABNORMAL LOW (ref 3.5–5.1)
Sodium: 136 mEq/L (ref 135–145)

## 2013-01-19 LAB — CBC
HCT: 29.9 % — ABNORMAL LOW (ref 36.0–46.0)
Hemoglobin: 10.5 g/dL — ABNORMAL LOW (ref 12.0–15.0)
MCH: 32 pg (ref 26.0–34.0)
MCHC: 35.1 g/dL (ref 30.0–36.0)
MCV: 91.2 fL (ref 78.0–100.0)
Platelets: 220 10*3/uL (ref 150–400)
RBC: 3.28 MIL/uL — ABNORMAL LOW (ref 3.87–5.11)
RDW: 12.4 % (ref 11.5–15.5)
WBC: 5.9 10*3/uL (ref 4.0–10.5)

## 2013-01-19 MED ORDER — KETOROLAC TROMETHAMINE 30 MG/ML IJ SOLN
INTRAMUSCULAR | Status: AC
  Administered 2013-01-19: 15 mg
  Filled 2013-01-19: qty 1

## 2013-01-19 NOTE — Progress Notes (Signed)
CARE MANAGEMENT NOTE 01/19/2013  Patient:  Melissa Grimes, Melissa Grimes   Account Number:  [id number]  Date Initiated:  01/19/2013  Documentation initiated by:  Vance Peper  Subjective/Objective Assessment:   57 yr old female s/p left total hip arthroplasty.     Action/Plan:   CM spoke with patient concerning home health and DME needs at discharge. Choice offered. Patient has support at discharge. DME to be delivered.   Anticipated DC Date:  01/21/2013   Anticipated DC Plan:  HOME W HOME HEALTH SERVICES      DC Planning Services  CM consult      PAC Choice  DURABLE MEDICAL EQUIPMENT  HOME HEALTH   Choice offered to / List presented to:  C-1 Patient   DME arranged  [facility]  3-N-1      DME agency  [employer]     HH arranged  HH-2 PT      HH agency  [facility]   Status of service:  Completed, signed off Medicare Important Message given?   (If response is "NO", the following Medicare IM given date fields will be blank) Date Medicare IM given:   Date Additional Medicare IM given:    Discharge Disposition:  HOME W HOME HEALTH SERVICES  Per UR Regulation:    If discussed at Long Length of Stay Meetings, dates discussed:    Comments:

## 2013-01-19 NOTE — Op Note (Signed)
NAMEERNESTYNE, Melissa Grimes NO.:  [id number]  MEDICAL RECORD NO.:  [id number]  LOCATION:  5N11C                        FACILITY:  [facility]  PHYSICIAN:  Vanita Panda. Magnus Ivan, M.D.DATE OF BIRTH:  [birth year]  DATE OF PROCEDURE:  01/18/2013 DATE OF DISCHARGE:                              OPERATIVE REPORT   PREOPERATIVE DIAGNOSES:  Severe end-stage arthritis and degenerative joint disease, right hip.  POSTOPERATIVE DIAGNOSES:  Severe end-stage arthritis and degenerative joint disease, right hip.  PROCEDURE:  Right total hip arthroplasty through direct anterior approach.  IMPLANTS:  DePuy 48 Sector Gription acetabular component size 50, size 32+ 4 neutral polyethylene liner, Corail femoral component size 9 with varus offset (KLA), size 32+ 1 ceramic hip ball.  SURGEON:  Vanita Panda. Magnus Ivan, M.D.  ASSISTANT:  Richardean Canal, PA-C.  ANESTHESIA:  General.  ANTIBIOTICS:  2 g IV Ancef.  BLOOD LOSS:  300-350 mL.  COMPLICATIONS:  None.  INDICATIONS:  Melissa Grimes is a 57 year old female with severe end-stage arthritis of both her hips with the right worse than left.  She has tried anti-inflammatories, steroid injection, her hip socket, rest and activity modification, weight reduction even walking with assisted devices.  Her activities of daily living are surely limited.  Her pain is daily.  Her mobility is limited.  She is at the point with failure of conservative treatment.  She wished to proceed with a total hip arthroplasty.  The risks and benefits of this were explained to her in detail including the risk of acute blood loss anemia, nerve and vessel injury, fracture, infections and DVT.  The goals are decreased pain and improved mobility and improved quality of life.  PROCEDURE DESCRIPTION:  After informed consent was obtained, appropriate right hip was marked.  She was brought to the operating room.  General anesthesia was obtained while she was on her  stretcher.  A Foley catheter was placed and the both feet had traction boots applied to them.  She was then placed supine on the Hana fracture table with the perineal post in place and both legs in inline skeletal traction, but no traction applied.  We then assessed the center of the pelvis and the left and right hips, so we could obtain our leg lengths with the fluoroscopic guidance with C-arm.  We then prepped the C-arm with sterile drapes and then prepped the right hip with DuraPrep and sterile drapes.  A time-out was called to identify the correct patient and correct right hip.  I then made an incision just inferior and posterior to the anterior-superior iliac spine and carried this obliquely down the leg.  I dissected down to the tensor fascia lata and the tensor fascia was divided longitudinally.  I then proceeded with a direct anterior approach to the hip.  A Cobra retractor was then placed around the lateral neck and up underneath the rectus femoris.  A medial retractor was placed.  I cauterized the lateral femoral circumflex vessels and then I opened up the hip capsule in a L-type format.  We did find a significant joint effusion.  We placed the hip retractors within the hip capsule itself then.  We then made our femoral neck cut  just proximal to the lesser trochanter with an oscillating saw and finished this off with an osteotome.  We then placed a corkscrew guide in the femoral head and removed the femoral head in its entirety.  The found completely worn of cartilage with harden bone in some areas and soft bone in the other areas.  We then cleaned the acetabulum debris and the acetabular remnants with acetabular labrum.  I placed a bent Hohmann medially and a Cobra retractor laterally.  We then began reaming from size 42 reamer in 2 mm increments only up to a size 48.  We then reamed down to 48 and we were able to appreciate our depth of reaming, our inclination, and version.   We then placed a real Sector Gription acetabular component size 48 and knocked this into the pelvis.  We placed the apex hole eliminator guide and then a single screw.  We then placed the real 32+ 4 neutral polyethylene liner.  Attention was then turned back to the femur with the leg externally rotated to 90 degrees, extended and adducted, with no traction applied.  We gained access to the femoral canal.  A Mueller retractor was placed medially and a Hohmann retractor around the greater trochanter.  I released the lateral capsule and then used a rongeur to lateralize and a box cutting guide to open up the femoral canal.  I then began broaching from a size 8 broach, only up to a size 9 broach and the 9 broach was stable, seeing that we were probably going to be a little bit long.  We trialed the varus/KLA neck and a 32+ 1 hip ball.  We brought the leg back up and over with traction, internal rotation, reduced this in the pelvis and it was stable.  Her leg lengths were also measured to be equal.  We then dislocated the hip and removed the trial components and placed the real HA coated femoral component, Corail size 9 (KLA), I placed the real 32+ 1 ceramic hip ball.  We then brought the leg back over and up again, reduced into the pelvis, and it was stable.  We used pulsatile lavage and 1 L of normal saline, total lavage of the hip joint.  We closed the joint capsule with interrupted #1 Ethibond suture, followed by running 0 V-Loc down the tensor fascia, 2-0 Vicryl in the deep and subcutaneous tissue and 4-0 Monocryl subcuticular stitch, Dermabond and Aquacel dressing.  She was then taken off the Hana table, awakened, extubated, and taken to recovery room in stable condition.  All final counts correct.  There were no complications noted.  Of note, Kriste Basque, PA-C was present during the entire case and whose assistance was needed for positioning the patient, exposure of the hip, placement of  the implants and closure.     Vanita Panda. Magnus Ivan, M.D.     CYB/MEDQ  D:  01/18/2013  T:  01/19/2013  Job:  [id number]

## 2013-01-19 NOTE — Progress Notes (Signed)
Physical Therapy Treatment Patient Details Name: Melissa Grimes MRN: [id number] DOB: 1956-07-20 Today's Date: 01/19/2013 Time: 0820-0859 PT Time Calculation (min): 39 min  PT Assessment / Plan / Recommendation Comments on Treatment Session  Pt pain much better controlled this morning.  Nausea and dizziness limitting mobility.      Follow Up Recommendations  Home health PT;Supervision/Assistance - 24 hour     Does the patient have the potential to tolerate intense rehabilitation     Barriers to Discharge        Equipment Recommendations  Rolling walker with 5" wheels;Other (comment) (3 in 1)    Recommendations for Other Services OT consult  Frequency 7X/week   Plan Discharge plan remains appropriate;Frequency remains appropriate    Precautions / Restrictions Precautions Precautions: None Precaution Comments: direct anterior approach no precautions.  Restrictions Weight Bearing Restrictions: Yes RLE Weight Bearing: Weight bearing as tolerated    Pertinent Vitals/Pain Pt reporting 3/10 pain in hip. No pain medication or intervention required per pt.      Mobility  Bed Mobility Bed Mobility: Supine to Sit;Sit to Supine Supine to Sit: 3: Mod assist;With rails;HOB flat Supine to Sit: Patient Percentage: 70% Sit to Supine: 2: Max assist;HOB flat Sit to Supine: Patient Percentage: 40% Details for Bed Mobility Assistance: Supine-->sit: assist for RLE, verbal cues for technique  Sit-->supine: assist for bilateral LEs,tactile cues for trunk management.   Transfers Transfers: Sit to Stand;Stand to Sit Sit to Stand: 4: Min assist;From bed;With upper extremity assist Stand to Sit: 4: Min assist;To bed;With upper extremity assist Details for Transfer Assistance: Verbal cues for technique and WBAT on RLE. Pt avoids WB on RLE. Pt able to stand for < 1 minute with c/o worsening nausea and dizziness.  Returned pt to supine.   Ambulation/Gait Ambulation/Gait Assistance: Not tested  (comment)    Exercises Total Joint Exercises Ankle Circles/Pumps: Both;10 reps Quad Sets: Right;5 reps;Supine Gluteal Sets: Both;5 reps;Supine Heel Slides: Right;10 reps;Supine;AROM;AAROM (5 AAROM and 5 AROM)   PT Diagnosis:    PT Problem List:   PT Treatment Interventions:     PT Goals Acute Rehab PT Goals PT Goal Formulation: With patient Time For Goal Achievement: 01/25/13 Potential to Achieve Goals: Good Pt will go Supine/Side to Sit: with supervision PT Goal: Supine/Side to Sit - Progress: Progressing toward goal Pt will go Sit to Supine/Side: with supervision PT Goal: Sit to Supine/Side - Progress: Progressing toward goal Pt will go Sit to Stand: with supervision PT Goal: Sit to Stand - Progress: Progressing toward goal Pt will go Stand to Sit: with supervision PT Goal: Stand to Sit - Progress: Progressing toward goal Pt will Transfer Bed to Chair/Chair to Bed: with supervision Pt will Ambulate: 51 - 150 feet;with supervision;with rolling walker Pt will Go Up / Down Stairs: 3-5 stairs;with supervision;with least restrictive assistive device  Visit Information  Last PT Received On: 01/19/13    Subjective Data  Subjective: Agree to OOB with PT Patient Stated Goal: be able to walk without pain.   Cognition  Cognition Overall Cognitive Status: Appears within functional limits for tasks assessed/performed Arousal/Alertness: Lethargic Orientation Level: Appears intact for tasks assessed;Oriented X4 / Intact Behavior During Session: Lethargic    Balance  Balance Balance Assessed: Yes Static Sitting Balance Static Sitting - Balance Support: Bilateral upper extremity supported;Feet supported Static Sitting - Level of Assistance: 5: Stand by assistance;4: Min assist Static Sitting - Comment/# of Minutes: stand by assist first trial, mod assist second trial after standing secondary to  pt c/o dizziness and nausea.  End of Session PT - End of Session Equipment Utilized  During Treatment: Gait belt Activity Tolerance: Patient limited by fatigue;Treatment limited secondary to medical complications (Comment) (nausea and dizziness. ) Patient left: in bed;with call bell/phone within reach;with bed alarm set Nurse Communication: Mobility status   GP     Melissa Grimes 01/19/2013, 10:10 AM Melissa Grimes DPT [phone]

## 2013-01-19 NOTE — Progress Notes (Signed)
Physical Therapy Treatment Patient Details Name: Melissa Grimes MRN: [id number] DOB: Aug 12, 1956 Today's Date: 01/19/2013 Time: 4098-1191 PT Time Calculation (min): 27 min  PT Assessment / Plan / Recommendation Comments on Treatment Session  Pt mobility continues to be limited by c/o worsening dizziness and nausea in sitting pt reports that she feels like she is passing out.       Follow Up Recommendations  Home health PT;Supervision/Assistance - 24 hour     Does the patient have the potential to tolerate intense rehabilitation     Barriers to Discharge        Equipment Recommendations  Rolling walker with 5" wheels;Other (comment)    Recommendations for Other Services OT consult  Frequency 7X/week   Plan Discharge plan remains appropriate;Frequency remains appropriate    Precautions / Restrictions Precautions Precautions: None Restrictions Weight Bearing Restrictions: Yes RLE Weight Bearing: Weight bearing as tolerated   Pertinent Vitals/Pain Pt c/o 1/10 pain in R hip.  BP in trendelenburg at end of session 106/49.  RN notified.      Mobility  Bed Mobility Bed Mobility: Supine to Sit;Sit to Supine Supine to Sit: 3: Mod assist;With rails;HOB flat Supine to Sit: Patient Percentage: 70% Sit to Supine: 2: Max assist;HOB flat Sit to Supine: Patient Percentage: 40% Details for Bed Mobility Assistance: Supine-->sit: assist for RLE, verbal cues for technique  Sit-->supine: assist for bilateral LEs,tactile cues for trunk management.   Transfers Transfers: Not assessed Ambulation/Gait Ambulation/Gait Assistance: Not tested (comment)    Exercises     PT Diagnosis:    PT Problem List:   PT Treatment Interventions:     PT Goals Acute Rehab PT Goals PT Goal Formulation: With patient Time For Goal Achievement: 01/25/13 Potential to Achieve Goals: Good Pt will go Supine/Side to Sit: with supervision Pt will go Sit to Supine/Side: with supervision  Visit Information  Last  PT Received On: 01/19/13    Subjective Data  Subjective: I feel better than this morning.    Cognition  Cognition Overall Cognitive Status: Appears within functional limits for tasks assessed/performed Arousal/Alertness: Lethargic Orientation Level: Appears intact for tasks assessed;Oriented X4 / Intact Behavior During Session: Lethargic    Balance  Balance Balance Assessed: No  End of Session PT - End of Session Activity Tolerance: Patient limited by fatigue;Treatment limited secondary to medical complications (Comment) (nausea and dizziness. )) Patient left: in bed;with call bell/phone within reach;with bed alarm set Nurse Communication: Mobility status   GP     Melissa Grimes 01/19/2013, 5:10 PM Melissa Grimes L. Erland Vivas DPT [phone]

## 2013-01-19 NOTE — Progress Notes (Signed)
UR COMPLETED  

## 2013-01-19 NOTE — Progress Notes (Signed)
Subjective: 1 Day Post-Op Procedure(s) (LRB): Right TOTAL HIP ARTHROPLASTY ANTERIOR APPROACH (Right) Patient reports pain as mild.  Hgb a little down, but asymptomatic acute blood loss anemia.  Objective: Vital signs in last 24 hours: Temp:  [97 F (36.1 C)-98.2 F (36.8 C)] 97.9 F (36.6 C) (03/05 0654) Pulse Rate:  [54-84] 75 (03/05 0654) Resp:  [6-20] 18 (03/05 0654) BP: (119-157)/(69-97) 140/76 mmHg (03/05 0654) SpO2:  [96 %-100 %] 100 % (03/05 0654)  Intake/Output from previous day: 03/04 0701 - 03/05 0700 In: 3283.8 [P.O.:480; I.V.:2753.8; IV Piggyback:50] Out: 1350 [Urine:1050; Blood:300] Intake/Output this shift:     Recent Labs  01/19/13 0612  HGB 10.5*    Recent Labs  01/19/13 0612  WBC 5.9  RBC 3.28*  HCT 29.9*  PLT 220    Recent Labs  01/19/13 0612  NA 136  K 3.4*  CL 103  CO2 25  BUN 8  CREATININE 0.65  GLUCOSE 105*  CALCIUM 8.1*   No results found for this basename: LABPT, INR,  in the last 72 hours  Sensation intact distally Intact pulses distally Dorsiflexion/Plantar flexion intact Incision: dressing C/D/I  Assessment/Plan: 1 Day Post-Op Procedure(s) (LRB): Right TOTAL HIP ARTHROPLASTY ANTERIOR APPROACH (Right) Up with therapy, WBAT right hip, no hip precautions Hep lock IV D/C foley  Melissa Grimes Y 01/19/2013, 7:42 AM

## 2013-01-19 NOTE — Progress Notes (Addendum)
PHYSICAL THERAPY EVALUATION NOTE: 01/18/2013  01/18/13 1800  PT Visit Information  Last PT Received On 01/18/13  PT Time Calculation  PT Start Time 1815  PT Stop Time 1826  PT Time Calculation (min) 11 min  Subjective Data  Subjective agree to PT eval.    Patient Stated Goal be able to walk.    Precautions  Precautions None  Precaution Comments direct anterior approach no precautions.   Restrictions  Weight Bearing Restrictions Yes  RLE Weight Bearing WBAT  Home Living  Lives With Spouse  Available Help at Discharge Family;Available PRN/intermittently (Spouse works days.  )  Type of Home House  Home Access Stairs to enter  Home Layout Two level;Able to live on main level with bedroom/bathroom  Home Adaptive Equipment Straight cane  Prior Function  Level of Independence Independent with assistive device(s) (cane)  Able to Take Stairs? Yes  Driving Yes  Communication  Communication No difficulties  Cognition  Overall Cognitive Status Appears within functional limits for tasks assessed/performed  Arousal/Alertness Lethargic  Orientation Level Appears intact for tasks assessed;Oriented X4 / Intact  Behavior During Session Lethargic  Right Lower Extremity Assessment  RLE ROM/Strength/Tone Unable to fully assess;Due to precautions;Due to pain  Left Lower Extremity Assessment  LLE ROM/Strength/Tone Unable to fully assess  Bed Mobility  Bed Mobility Supine to Sit;Sit to Supine  Supine to Sit 1: +2 Total assist;HOB flat  Supine to Sit: Patient Percentage 40%  Sit to Supine 1: +2 Total assist;HOB flat  Sit to Supine: Patient Percentage 20%  Transfers  Transfers Not assessed  Ambulation/Gait  Ambulation/Gait Assistance Not tested (comment)  Balance  Balance Assessed Yes  Static Sitting Balance  Static Sitting - Balance Support Bilateral upper extremity supported;Feet supported  Static Sitting - Level of Assistance 3: Mod assist  Static Sitting - Comment/# of Minutes Pain  limiting pt ability to distribute wt evenly over bilateral hips pt leaning away from R hip.   PT - End of Session  Patient left in bed;with call bell/phone within reach;with bed alarm set  Nurse Communication Mobility status  PT Assessment  Clinical Impression Statement Pt is a 57 y/o female s/p R THA.  Acute PT to follow pt to promote functional independence for safe d/c to home with HHPT.    PT Recommendation  Recommendations for Other Services OT consult  Acute Rehab PT Goals  PT Goal Formulation With patient  Time For Goal Achievement 01/25/13  Potential to Achieve Goals Good  Pt will go Supine/Side to Sit with supervision  PT Goal: Supine/Side to Sit - Progress Goal set today  Pt will go Sit to Supine/Side with supervision  PT Goal: Sit to Supine/Side - Progress Goal set today  Pt will go Sit to Stand with supervision  PT Goal: Sit to Stand - Progress Goal set today  Pt will go Stand to Sit with supervision  PT Goal: Stand to Sit - Progress Goal set today  Pt will Transfer Bed to Chair/Chair to Bed with supervision  PT Transfer Goal: Bed to Chair/Chair to Bed - Progress Goal set today  Pt will Ambulate 51 - 150 feet;with supervision;with rolling walker  PT Goal: Ambulate - Progress Goal set today  Pt will Go Up / Down Stairs 3-5 stairs;with supervision;with least restrictive assistive device  PT Goal: Up/Down Stairs - Progress Goal set today  Pt will Perform Home Exercise Program with supervision, verbal cues required/provided  PT Goal: Perform Home Exercise Program - Progress Goal set today  PT General Charges  $$ ACUTE PT VISIT 1 Procedure  PT Evaluation  $Initial PT Evaluation Tier I 1 Procedure  PT Treatments  $Therapeutic Activity 8-22 mins  Suvan Stcyr L. Charnese Federici DPT [phone]

## 2013-01-20 LAB — CBC
HCT: 31.5 % — ABNORMAL LOW (ref 36.0–46.0)
Hemoglobin: 11 g/dL — ABNORMAL LOW (ref 12.0–15.0)
MCH: 32.6 pg (ref 26.0–34.0)
MCHC: 34.9 g/dL (ref 30.0–36.0)
MCV: 93.5 fL (ref 78.0–100.0)
Platelets: 225 10*3/uL (ref 150–400)
RBC: 3.37 MIL/uL — ABNORMAL LOW (ref 3.87–5.11)
RDW: 12.5 % (ref 11.5–15.5)
WBC: 6.8 10*3/uL (ref 4.0–10.5)

## 2013-01-20 NOTE — Progress Notes (Signed)
Subjective: 2 Days Post-Op Procedure(s) (LRB): Right TOTAL HIP ARTHROPLASTY ANTERIOR APPROACH (Right) Patient reports pain as mild.  Looks really good this am.  Hgb up.  Not much progress with therapy yesterday.  Objective: Vital signs in last 24 hours: Temp:  [98.6 F (37 C)] 98.6 F (37 C) (03/05 2209) Pulse Rate:  [64-89] 89 (03/05 2209) Resp:  [18] 18 (03/05 2209) BP: (106-110)/(49-56) 110/56 mmHg (03/05 2209) SpO2:  [100 %] 100 % (03/05 2209)  Intake/Output from previous day:   Intake/Output this shift:     Recent Labs  01/19/13 0612 01/20/13 0418  HGB 10.5* 11.0*    Recent Labs  01/19/13 0612 01/20/13 0418  WBC 5.9 6.8  RBC 3.28* 3.37*  HCT 29.9* 31.5*  PLT 220 225    Recent Labs  01/19/13 0612  NA 136  K 3.4*  CL 103  CO2 25  BUN 8  CREATININE 0.65  GLUCOSE 105*  CALCIUM 8.1*   No results found for this basename: LABPT, INR,  in the last 72 hours  Intact pulses distally Dorsiflexion/Plantar flexion intact Incision: dressing C/D/I Compartment soft  Assessment/Plan: 2 Days Post-Op Procedure(s) (LRB): Right TOTAL HIP ARTHROPLASTY ANTERIOR APPROACH (Right) Up with therapy Hopefully d/c to home tomorrow with HHPT if makes good progress today.  BLACKMAN,CHRISTOPHER Y 01/20/2013, 6:56 AM

## 2013-01-20 NOTE — Progress Notes (Signed)
01/20/13 1500  PT Visit Information  Last PT Received On 01/20/13  Assistance Needed +1  PT Time Calculation  PT Start Time 1500  PT Stop Time 1529  PT Time Calculation (min) 29 min  Subjective Data  Subjective hurting, I am glad you are here  Precautions  Precautions None  Precaution Comments direct anterior approach no precautions.   Restrictions  RLE Weight Bearing WBAT  Cognition  Overall Cognitive Status Appears within functional limits for tasks assessed/performed  Arousal/Alertness Awake/alert  Orientation Level Oriented X4 / Intact  Behavior During Session [facility] for tasks performed  Bed Mobility  Bed Mobility Supine to Sit;Sit to Supine;Sitting - Scoot to Edge of Bed  Supine to Sit 4: Min assist  Sitting - Scoot to [place] of Bed 4: Min guard  Sit to Supine 4: Min assist  Details for Bed Mobility Assistance assist with RLE and verbal cues for technique; requires increased time  Transfers  Transfers Sit to Stand;Stand to [employer] Transfers  Sit to Stand 4: Min assist;4: Min guard;From chair/3-in-1;With upper extremity assist;From bed  Stand to Sit 4: Min assist;To chair/3-in-1;To bed  Stand Pivot Transfers 4: Min assist  Details for Transfer Assistance Verbal cues for technique and WBAT on RLE. Pt avoids WB on RLE  Ambulation/Gait  Ambulation/Gait Assistance 4: Min assist  Ambulation Distance (Feet) 20 Feet  Assistive device Rolling walker  Ambulation/Gait Assistance Details cues for sequence and posture,assist to advance RLE  Gait Pattern Step-to pattern;Decreased step length - left;Decreased stance time - right;Antalgic  Total Joint Exercises  Short Arc Quad AROM;AAROM;Right;10 reps  PT - End of Session  Activity Tolerance Patient limited by pain;Patient limited by fatigue  Patient left in bed;with call bell/phone within reach  PT - Assessment/Plan  Comments on Treatment Session Discussed possible stair training in am tomorrow if pt progresses. pt pleased that   she walked further; still requires extra time; placed in end range right hip flexion  at end of sesion and pt reported less pain with positioing  PT Plan Discharge plan remains appropriate;Frequency remains appropriate  PT Frequency 7X/week  Acute Rehab PT Goals  Time For Goal Achievement 01/25/13  Potential to Achieve Goals Good  Pt will go Supine/Side to Sit with supervision  PT Goal: Supine/Side to Sit - Progress Progressing toward goal  Pt will go Sit to Supine/Side with supervision  PT Goal: Sit to Supine/Side - Progress Progressing toward goal  Pt will go Sit to Stand with supervision  PT Goal: Sit to Stand - Progress Progressing toward goal  Pt will go Stand to Sit with supervision  PT Goal: Stand to Sit - Progress Progressing toward goal  Pt will Transfer Bed to Chair/Chair to Bed with supervision  PT Transfer Goal: Bed to Chair/Chair to Bed - Progress Progressing toward goal  Pt will Ambulate 51 - 150 feet;with supervision;with rolling walker  PT Goal: Ambulate - Progress Progressing toward goal  PT General Charges  $$ ACUTE PT VISIT 1 Procedure  PT Treatments  $Gait Training 23-37 mins

## 2013-01-20 NOTE — Evaluation (Signed)
Occupational Therapy Evaluation Patient Details Name: Melissa Grimes MRN: [id number] DOB: 22-Nov-1955 Today's Date: 01/20/2013 Time: 7829-5621 OT Time Calculation (min): 26 min  OT Assessment / Plan / Recommendation Clinical Impression  Pt demos decline in function with ADLs, strength, balance, activity tolerance and safety following R THA. Pt would benefit from OT services to address these impairments to help retsore PLOF to return home    OT Assessment  Patient needs continued OT Services    Follow Up Recommendations  Home health OT;Supervision/Assistance - 24 hour    Barriers to Discharge None    Equipment Recommendations  3 in 1 bedside comode;Tub/shower seat;Other (comment) (ADL A/E)    Recommendations for Other Services    Frequency  Min 2X/week    Precautions / Restrictions Precautions Precautions: None Precaution Comments: direct anterior approach no precautions.  Restrictions Weight Bearing Restrictions: Yes RLE Weight Bearing: Weight bearing as tolerated   Pertinent Vitals/Pain     ADL  Grooming: Performed;Wash/dry hands;Wash/dry face;Brushing hair;Set up Where Assessed - Grooming: Unsupported sitting Upper Body Bathing: Simulated;Supervision/safety;Set up Where Assessed - Upper Body Bathing: Unsupported sitting Lower Body Bathing: Maximal assistance Where Assessed - Lower Body Bathing: Unsupported sitting Upper Body Dressing: Performed;Supervision/safety;Set up Where Assessed - Upper Body Dressing: Unsupported sitting Lower Body Dressing: +1 Total assistance Where Assessed - Lower Body Dressing: Unsupported sitting Toilet Transfer: Performed;Minimal assistance (pt declined ambulating to bathroom due to pain) Toilet Transfer Method: Sit to stand Toilet Transfer Equipment: Bedside commode Toileting - Clothing Manipulation and Hygiene: Performed;Maximal assistance Where Assessed - Engineer, mining and Hygiene: Standing Transfers/Ambulation  Related to ADLs: pt required verbal cues with correct hand placement and LE positioning ADL Comments: Pt provided with education and demo of ADL A/E for use at home    OT Diagnosis: Generalized weakness;Acute pain  OT Problem List: Decreased strength;Decreased knowledge of use of DME or AE;Pain;Impaired balance (sitting and/or standing);Decreased activity tolerance OT Treatment Interventions: Self-care/ADL training;Balance training;Therapeutic exercise;Neuromuscular education;Therapeutic activities;DME and/or AE instruction;Patient/family education   OT Goals Acute Rehab OT Goals OT Goal Formulation: With patient Time For Goal Achievement: 01/27/13 Potential to Achieve Goals: Good ADL Goals Pt Will Perform Grooming: Standing at sink ADL Goal: Grooming - Progress: Goal set today Pt Will Perform Lower Body Bathing: with mod assist;with adaptive equipment ADL Goal: Lower Body Bathing - Progress: Goal set today Pt Will Perform Lower Body Dressing: with mod assist;with max assist;with adaptive equipment ADL Goal: Lower Body Dressing - Progress: Goal set today Pt Will Transfer to Toilet: with supervision;with min assist;with DME ADL Goal: Toilet Transfer - Progress: Goal set today Pt Will Perform Toileting - Clothing Manipulation: with min assist;Standing ADL Goal: Toileting - Clothing Manipulation - Progress: Goal set today Pt Will Perform Tub/Shower Transfer: with supervision;with min assist;with DME ADL Goal: Tub/Shower Transfer - Progress: Goal set today  Visit Information  Last OT Received On: 01/20/13 Assistance Needed: +1    Subjective Data  Subjective: " I think I overdid it earlier with physical therapy " Patient Stated Goal: To return home   Prior Functioning     Home Living Lives With: Spouse Available Help at Discharge: Family;Available PRN/intermittently Type of Home: House Home Access: Stairs to enter Entrance Stairs-Number of Steps: 12 Home Layout: Two level;Able  to live on main level with bedroom/bathroom Bathroom Shower/Tub: Walk-in shower;Tub only;Door [employer] Toilet: Standard Bathroom Accessibility: Yes How Accessible: Accessible via walker Home Adaptive Equipment: None Prior Function Level of Independence: Independent Able to Take Stairs?: Yes Driving: Yes Vocation: Full  time employment Communication Communication: No difficulties Dominant Hand: Right         Vision/Perception Vision - History Baseline Vision: Wears glasses only for reading Patient Visual Report: No change from baseline Perception Perception: Within Functional Limits   Cognition  Cognition Overall Cognitive Status: Appears within functional limits for tasks assessed/performed Arousal/Alertness: Awake/alert Orientation Level: Oriented X4 / Intact Behavior During Session: [facility] for tasks performed    Extremity/Trunk Assessment Right Upper Extremity Assessment RUE ROM/Strength/Tone: [facility] for tasks assessed Left Upper Extremity Assessment LUE ROM/Strength/Tone: [facility] for tasks assessed     Mobility Bed Mobility Bed Mobility: Supine to Sit;Sitting - Scoot to Edge of Bed;Sit to Supine Supine to Sit: 3: Mod assist;With rails;HOB flat Sitting - Scoot to Edge of Bed: 4: Min guard Sit to Supine: 4: Min assist Details for Bed Mobility Assistance: assist with RLE and verbal cues for technique; requires increased time Transfers Sit to Stand: 4: Min assist;4: Min guard;From chair/3-in-1;With upper extremity assist Stand to Sit: 4: Min assist;To bed Details for Transfer Assistance: Verbal cues for technique and WBAT on RLE. Pt avoids WB on RLE        Balance Balance Balance Assessed: No   End of Session OT - End of Session Equipment Utilized During Treatment: Gait belt (RW, BSC, ADL A/E) Activity Tolerance: Patient limited by pain Patient left: in bed;with call bell/phone within reach  GO     Galen Manila 01/20/2013, 2:52 PM

## 2013-01-20 NOTE — Progress Notes (Signed)
Physical Therapy Treatment Patient Details Name: Melissa Grimes MRN: [id number] DOB: 09/25/1956 Today's Date: 01/20/2013 Time: 1191-4782 PT Time Calculation (min): 25 min  PT Assessment / Plan / Recommendation Comments on Treatment Session  pt better overall today, no nausea/dizziness, progressing although slowly    Follow Up Recommendations  Home health PT;Supervision/Assistance - 24 hour     Does the patient have the potential to tolerate intense rehabilitation     Barriers to Discharge        Equipment Recommendations  Rolling walker with 5" wheels;Other (comment)    Recommendations for Other Services    Frequency 7X/week   Plan Discharge plan remains appropriate;Frequency remains appropriate    Precautions / Restrictions Precautions Precautions: None Precaution Comments: direct anterior approach no precautions.  Restrictions RLE Weight Bearing: Weight bearing as tolerated   Pertinent Vitals/Pain     Mobility  Bed Mobility Bed Mobility: Sit to Supine Sit to Supine: 4: Min assist Details for Bed Mobility Assistance: assist with RLE and verbal cues for technique; requires increased time Transfers Transfers: Sit to Stand;Stand to Sit Sit to Stand: 4: Min assist;4: Min guard;From chair/3-in-1;With upper extremity assist Stand to Sit: 4: Min assist;4: Min guard;To bed Details for Transfer Assistance: Verbal cues for technique and WBAT on RLE. Pt avoids WB on RLE. Pt able to stand for < 1 minute with c/o worsening nausea and dizziness.  Returned pt to supine.   Ambulation/Gait Ambulation/Gait Assistance: 4: Min assist Ambulation Distance (Feet): 16 Feet Assistive device: Rolling walker Ambulation/Gait Assistance Details: verbal cues for technique, sequence; min assist intermittently to advance RLE Gait Pattern: Step-to pattern;Decreased step length - left;Decreased stance time - right;Antalgic    Exercises Total Joint Exercises Ankle Circles/Pumps: Both;10 reps Quad  Sets: AROM;Both;10 reps Gluteal Sets: Both;5 reps;Supine Heel Slides: Right;10 reps;Supine;AROM;AAROM (incr time and rest breaks)   PT Diagnosis:    PT Problem List:   PT Treatment Interventions:     PT Goals Acute Rehab PT Goals Time For Goal Achievement: 01/25/13 Potential to Achieve Goals: Good Pt will go Sit to Supine/Side: with supervision PT Goal: Sit to Supine/Side - Progress: Progressing toward goal Pt will go Sit to Stand: with supervision PT Goal: Sit to Stand - Progress: Progressing toward goal Pt will go Stand to Sit: with supervision PT Goal: Stand to Sit - Progress: Progressing toward goal Pt will Ambulate: 51 - 150 feet;with supervision;with rolling walker PT Goal: Ambulate - Progress: Progressing toward goal Pt will Perform Home Exercise Program: with supervision, verbal cues required/provided PT Goal: Perform Home Exercise Program - Progress: Progressing toward goal  Visit Information  Last PT Received On: 01/20/13 Assistance Needed: +1    Subjective Data  Subjective: I am doing good   Cognition  Cognition Overall Cognitive Status: Appears within functional limits for tasks assessed/performed Arousal/Alertness: Awake/alert Orientation Level: Appears intact for tasks assessed Behavior During Session: [facility] for tasks performed    Balance     End of Session PT - End of Session Equipment Utilized During Treatment: Gait belt Activity Tolerance: Patient limited by pain;Patient limited by fatigue Patient left: in bed;with call bell/phone within reach   GP     [facility] 01/20/2013, 1:38 PM

## 2013-01-20 NOTE — Plan of Care (Signed)
Problem: Phase II Progression Outcomes Goal: Discharge plan established Recommend shower chair and 3 in 1 for home use and HH OT for ADL trg and ADL mobility safety trg after acute care d/c

## 2013-01-21 LAB — CBC
HCT: 30.5 % — ABNORMAL LOW (ref 36.0–46.0)
Hemoglobin: 10.6 g/dL — ABNORMAL LOW (ref 12.0–15.0)
MCH: 32.3 pg (ref 26.0–34.0)
MCHC: 34.8 g/dL (ref 30.0–36.0)
MCV: 93 fL (ref 78.0–100.0)
Platelets: 226 10*3/uL (ref 150–400)
RBC: 3.28 MIL/uL — ABNORMAL LOW (ref 3.87–5.11)
RDW: 12.2 % (ref 11.5–15.5)
WBC: 7.6 10*3/uL (ref 4.0–10.5)

## 2013-01-21 MED ORDER — OXYCODONE-ACETAMINOPHEN 5-325 MG PO TABS: 1.0000 | ORAL_TABLET | Freq: Four times a day (QID) | ORAL | Status: DC | PRN

## 2013-01-21 MED ORDER — METHOCARBAMOL 500 MG PO TABS: 500.0000 mg | ORAL_TABLET | Freq: Four times a day (QID) | ORAL | Status: DC | PRN

## 2013-01-21 MED ORDER — ASPIRIN 325 MG PO TBEC: 325.0000 mg | DELAYED_RELEASE_TABLET | Freq: Two times a day (BID) | ORAL | Status: DC

## 2013-01-21 NOTE — Progress Notes (Signed)
Physical Therapy Treatment Patient Details Name: Sibbie Flammia MRN: [id number] DOB: Jul 04, 1956 Today's Date: 01/21/2013 Time: 2440-1027 PT Time Calculation (min): 24 min  PT Assessment / Plan / Recommendation Comments on Treatment Session  Patient able to tolerate stair training well and increase ambulation. Progressing well. Will need to attempt stairs again in AM prior to discharge home    Follow Up Recommendations  Home health PT;Supervision/Assistance - 24 hour     Does the patient have the potential to tolerate intense rehabilitation     Barriers to Discharge        Equipment Recommendations  Rolling walker with 5" wheels;Other (comment)    Recommendations for Other Services    Frequency 7X/week   Plan Discharge plan remains appropriate;Frequency remains appropriate    Precautions / Restrictions Precautions Precautions: None Precaution Comments: direct anterior approach no precautions.  Restrictions Weight Bearing Restrictions: Yes RLE Weight Bearing: Weight bearing as tolerated   Pertinent Vitals/Pain     Mobility  Bed Mobility Bed Mobility: Not assessed Supine to Sit: 5: Supervision;With rails Sitting - Scoot to Edge of Bed: 5: Supervision;With rail Details for Bed Mobility Assistance:  requires increased time Transfers Sit to Stand: 4: Min guard;With upper extremity assist;From chair/3-in-1;From bed Stand to Sit: 4: Min guard;To chair/3-in-1;With upper extremity assist Details for Transfer Assistance: Cues for technique and hand placement Ambulation/Gait Ambulation/Gait Assistance: 4: Min guard Ambulation Distance (Feet): 80 Feet Assistive device: Rolling walker Ambulation/Gait Assistance Details: Cues for posture. Patient able to advance R LE a lot better and more effiecently this session Gait Pattern: Step-to pattern;Decreased step length - left;Decreased stance time - right;Antalgic Gait velocity: decreased Stairs: Yes Stairs Assistance: 4: Min  guard Stair Management Technique: Step to pattern;Forwards;With cane;One rail Left Number of Stairs: 6    Exercises Total Joint Exercises Quad Sets: AROM;Both;10 reps Heel Slides: Right;10 reps;Supine;AAROM Hip ABduction/ADduction: AAROM;Right;10 reps   PT Diagnosis:    PT Problem List:   PT Treatment Interventions:     PT Goals Acute Rehab PT Goals PT Goal: Supine/Side to Sit - Progress: Progressing toward goal PT Goal: Sit to Stand - Progress: Progressing toward goal PT Goal: Stand to Sit - Progress: Progressing toward goal PT Transfer Goal: Bed to Chair/Chair to Bed - Progress: Progressing toward goal PT Goal: Ambulate - Progress: Progressing toward goal PT Goal: Perform Home Exercise Program - Progress: Progressing toward goal  Visit Information  Last PT Received On: 01/21/13 Assistance Needed: +1    Subjective Data      Cognition  Cognition Overall Cognitive Status: Appears within functional limits for tasks assessed/performed Arousal/Alertness: Awake/alert Orientation Level: Oriented X4 / Intact Behavior During Session: [facility] for tasks performed    Balance  Balance Balance Assessed: No  End of Session PT - End of Session Equipment Utilized During Treatment: Gait belt Activity Tolerance: Patient tolerated treatment well Patient left: in chair;with call bell/phone within reach Nurse Communication: Mobility status   GP     Fredrich Birks 01/21/2013, 1:58 PM 01/21/2013 Fredrich Birks PTA [phone] pager [phone] office

## 2013-01-21 NOTE — Progress Notes (Signed)
Physical Therapy Treatment Patient Details Name: Melissa Grimes MRN: [id number] DOB: 01/27/1956 Today's Date: 01/21/2013 Time: 1308-6578 PT Time Calculation (min): 30 min  PT Assessment / Plan / Recommendation Comments on Treatment Session  Patient able to ambulate a little further this AM. Would like to attempt stairs this afternoon    Follow Up Recommendations        Does the patient have the potential to tolerate intense rehabilitation     Barriers to Discharge        Equipment Recommendations  Rolling walker with 5" wheels;Other (comment)    Recommendations for Other Services    Frequency 7X/week   Plan Discharge plan remains appropriate;Frequency remains appropriate    Precautions / Restrictions Precautions Precaution Comments: direct anterior approach no precautions.  Restrictions Weight Bearing Restrictions: Yes RLE Weight Bearing: Weight bearing as tolerated   Pertinent Vitals/Pain     Mobility  Bed Mobility Supine to Sit: 5: Supervision;With rails Sitting - Scoot to Edge of Bed: 5: Supervision;With rail Details for Bed Mobility Assistance:  requires increased time Transfers Sit to Stand: 4: Min guard;With upper extremity assist;From chair/3-in-1;From bed Stand to Sit: 4: Min guard;To chair/3-in-1;With upper extremity assist Details for Transfer Assistance: Cues for technique and hand placement Ambulation/Gait Ambulation/Gait Assistance: 4: Min assist Ambulation Distance (Feet): 40 Feet Assistive device: Rolling walker Ambulation/Gait Assistance Details: Patient able to advance RLE however requires increased time and cueing Gait Pattern: Step-to pattern;Decreased step length - left;Decreased stance time - right;Antalgic Gait velocity: decreased    Exercises Total Joint Exercises Quad Sets: AROM;Both;10 reps Heel Slides: Right;10 reps;Supine;AAROM Hip ABduction/ADduction: AAROM;Right;10 reps   PT Diagnosis:    PT Problem List:   PT Treatment  Interventions:     PT Goals Acute Rehab PT Goals PT Goal: Supine/Side to Sit - Progress: Progressing toward goal PT Goal: Sit to Stand - Progress: Progressing toward goal PT Goal: Stand to Sit - Progress: Progressing toward goal PT Transfer Goal: Bed to Chair/Chair to Bed - Progress: Progressing toward goal PT Goal: Ambulate - Progress: Progressing toward goal PT Goal: Perform Home Exercise Program - Progress: Progressing toward goal  Visit Information  Last PT Received On: 01/21/13 Assistance Needed: +1    Subjective Data      Cognition  Cognition Overall Cognitive Status: Appears within functional limits for tasks assessed/performed Arousal/Alertness: Awake/alert Orientation Level: Oriented X4 / Intact Behavior During Session: [facility] for tasks performed    Balance     End of Session PT - End of Session Equipment Utilized During Treatment: Gait belt Activity Tolerance: Patient limited by pain;Patient limited by fatigue Nurse Communication: Mobility status   GP     Fredrich Birks 01/21/2013, 12:05 PM 01/21/2013 Fredrich Birks PTA [phone] pager [phone] office

## 2013-01-21 NOTE — Progress Notes (Signed)
Occupational Therapy Treatment Patient Details Name: Melissa Grimes MRN: [id number] DOB: 1956/02/10 Today's Date: 01/21/2013 Time: 6295-2841 OT Time Calculation (min): 25 min  OT Assessment / Plan / Recommendation Comments on Treatment Session Pt making progress and doing much better than from previous session. Pt should continue with OT services to maximize level of function and safety to return home    Follow Up Recommendations  Home health OT;Supervision/Assistance - 24 hour    Barriers to Discharge   none    Equipment Recommendations  3 in 1 bedside comode;Tub/shower seat;Other (comment)    Recommendations for Other Services    Frequency Min 2X/week   Plan Discharge plan remains appropriate    Precautions / Restrictions Precautions Precautions: None Precaution Comments: direct anterior approach no precautions.  Restrictions Weight Bearing Restrictions: Yes RLE Weight Bearing: Weight bearing as tolerated   Pertinent Vitals/Pain     ADL  Grooming: Performed;Wash/dry hands;Wash/dry face;Min guard Where Assessed - Grooming: Supported standing Lower Body Bathing: Simulated;Moderate assistance Where Assessed - Lower Body Bathing: Unsupported sitting Toilet Transfer: Performed;Minimal assistance Toilet Transfer Method: Sit to stand (ambulating from RW level) Toilet Transfer Equipment: Raised toilet seat with arms (or 3-in-1 over toilet) Toileting - Clothing Manipulation and Hygiene: Performed;Minimal assistance Where Assessed - Glass blower/designer Manipulation and Hygiene: Standing Tub/Shower Transfer: Performed;Minimal assistance Tub/Shower Transfer Method: Science writer: Shower seat with back;Grab bars Equipment Used: Long-handled sponge;Rolling walker;Gait belt;Leg lifter Transfers/Ambulation Related to ADLs: pt required verbal cues with correct hand placement and LE positioning ADL Comments: pt provided with education and demo using leg lifter  for R LE, pt able to return demo    OT Diagnosis:    OT Problem List:   OT Treatment Interventions:     OT Goals ADL Goals ADL Goal: Grooming - Progress: Progressing toward goals ADL Goal: Lower Body Bathing - Progress: Progressing toward goals ADL Goal: Toilet Transfer - Progress: Progressing toward goals ADL Goal: Toileting - Clothing Manipulation - Progress: Progressing toward goals ADL Goal: Tub/Shower Transfer - Progress: Progressing toward goals  Visit Information  Last OT Received On: 01/21/13 Assistance Needed: +1    Subjective Data  Subjective: " I think I should stay here til tomorrow, even though I feel alot better " Patient Stated Goal: To return home   Prior Functioning  Home Living Lives With: Spouse Available Help at Discharge: Family;Available PRN/intermittently Type of Home: House    Cognition  Cognition Overall Cognitive Status: Appears within functional limits for tasks assessed/performed Arousal/Alertness: Awake/alert Orientation Level: Oriented X4 / Intact Behavior During Session: [facility] for tasks performed    Mobility  Bed Mobility Bed Mobility: Not assessed Supine to Sit: 5: Supervision;With rails Sitting - Scoot to Edge of Bed: 5: Supervision;With rail Details for Bed Mobility Assistance:  requires increased time Transfers Transfers: Sit to Stand;Stand to Sit Sit to Stand: 4: Min guard;With upper extremity assist;From chair/3-in-1;From bed Stand to Sit: 4: Min guard;To chair/3-in-1;With upper extremity assist Details for Transfer Assistance: Cues for technique and hand placement       Balance Balance Balance Assessed: No   End of Session OT - End of Session Equipment Utilized During Treatment: Gait belt (RW, 3 in 1 over toilet, shower seat, leg lifter, LH sponge) Patient left: in chair;with call bell/phone within reach  GO     Galen Manila 01/21/2013, 1:19 PM

## 2013-01-21 NOTE — Progress Notes (Signed)
Patient ID: Melissa Grimes, female   DOB: 1956-10-10, 57 y.o.   MRN: [id number] Looks good.  Can discharge home today if safe to get into her house given the icy weather.  PT needs to work on stairs this am prior to d/c.

## 2013-01-21 NOTE — Discharge Summary (Signed)
Patient ID: Melissa Grimes MRN: [id number] DOB/AGE: 1956/06/20 57 y.o.  Admit date: 01/18/2013 Discharge date: 01/21/2013  Admission Diagnoses:  Principal Problem:   Degenerative arthritis of hip   Discharge Diagnoses:  Same  Past Medical History  Diagnosis Date  . Arthritis   . Hyperlipidemia   . Hypertension     Surgeries: Procedure(s): Right TOTAL HIP ARTHROPLASTY ANTERIOR APPROACH on 01/18/2013   Consultants:    Discharged Condition: Improved  Hospital Course: Melissa Grimes is an 57 y.o. female who was admitted 01/18/2013 for operative treatment ofDegenerative arthritis of hip. Patient has severe unremitting pain that affects sleep, daily activities, and work/hobbies. After pre-op clearance the patient was taken to the operating room on 01/18/2013 and underwent  Procedure(s): Right TOTAL HIP ARTHROPLASTY ANTERIOR APPROACH.    Patient was given perioperative antibiotics: Anti-infectives   Start     Dose/Rate Route Frequency Ordered Stop   01/18/13 1900  ceFAZolin (ANCEF) IVPB 1 g/50 mL premix     1 g 100 mL/hr over 30 Minutes Intravenous Every 6 hours 01/18/13 1630 01/19/13 0130   01/18/13 0600  ceFAZolin (ANCEF) IVPB 2 g/50 mL premix     2 g 100 mL/hr over 30 Minutes Intravenous On call to O.R. 01/17/13 1420 01/18/13 1310       Patient was given sequential compression devices, early ambulation, and chemoprophylaxis to prevent DVT.  Patient benefited maximally from hospital stay and there were no complications.    Recent vital signs: Patient Vitals for the past 24 hrs:  BP Temp Temp src Pulse Resp SpO2  01/21/13 0546 128/70 mmHg 98.7 F (37.1 C) Oral 80 16 98 %  01/20/13 2227 116/66 mmHg 98.7 F (37.1 C) Oral 77 16 96 %  01/20/13 1430 123/81 mmHg 97 F (36.1 C) - 82 17 100 %  01/20/13 0708 115/68 mmHg 98.9 F (37.2 C) - 84 18 100 %     Recent laboratory studies:  Recent Labs  01/19/13 0612 01/20/13 0418 01/21/13 0455  WBC 5.9 6.8 7.6  HGB 10.5* 11.0*  10.6*  HCT 29.9* 31.5* 30.5*  PLT 220 225 226  NA 136  --   --   K 3.4*  --   --   CL 103  --   --   CO2 25  --   --   BUN 8  --   --   CREATININE 0.65  --   --   GLUCOSE 105*  --   --   CALCIUM 8.1*  --   --      Discharge Medications:     Medication List    STOP taking these medications       BC FAST PAIN RELIEF ARTHRITIS PO     naproxen sodium 220 MG tablet  Commonly known as:  ANAPROX      TAKE these medications       Acidophilus 100 MG Caps  Take 100 mg by mouth daily.     aspirin 325 MG EC tablet  Take 1 tablet (325 mg total) by mouth 2 (two) times daily.     Calcium Citrate-Vitamin D 500-400 MG-UNIT Chew  Chew 2 tablets by mouth 2 (two) times daily.     estradiol-norethindrone 1-0.5 MG per tablet  Commonly known as:  MIMVEY  Take 1 tablet by mouth daily.     Fish Oil 1200 MG Caps  Take 1 capsule by mouth daily.     lisinopril 20 MG tablet  Commonly known as:  PRINIVIL,ZESTRIL  Take 1 tablet (20 mg total) by mouth daily.     methocarbamol 500 MG tablet  Commonly known as:  ROBAXIN  Take 1 tablet (500 mg total) by mouth every 6 (six) hours as needed.     multivitamin with minerals Tabs  Take 1 tablet by mouth daily. Centrum Silver     oxyCODONE-acetaminophen 5-325 MG per tablet  Commonly known as:  ROXICET  Take 1-2 tablets by mouth every 6 (six) hours as needed for pain.     pravastatin 20 MG tablet  Commonly known as:  PRAVACHOL  Take 20 mg by mouth daily.     Tart Cherry Advanced Caps  Take 3 capsules by mouth daily.        Diagnostic Studies: Dg Chest 2 View  12/22/2012  *RADIOLOGY REPORT*  Clinical Data: Preoperative assessment, hypertension, hyperlipidemia  CHEST - 2 VIEW  Comparison: None  Findings: Normal heart size, mediastinal contours, and pulmonary vascularity. Minimal peribronchial thickening. No pulmonary infiltrate, pleural effusion, or pneumothorax. Bones unremarkable.  IMPRESSION: Minimal bronchitic changes. No acute  abnormalities.   Original Report Authenticated By: Ulyses Southward, M.D.    Dg Hip Operative Right  01/18/2013  *RADIOLOGY REPORT*  Clinical Data: Post right hip arthroplasty.  DG OPERATIVE RIGHT HIP  Comparison: None.  Findings: Two-view intraoperative frontal views submitted for review after surgery. Post right hip replacement without complication noted on this single projection.  IMPRESSION: Post right hip replacement without complication noted.   Original Report Authenticated By: Lacy Duverney, M.D.    Dg Pelvis Portable  01/18/2013  *RADIOLOGY REPORT*  Clinical Data: Right total hip arthroplasty  PORTABLE PELVIS  Comparison: 01/18/2013  Findings: Right hip arthroplasty noted.  Components appear aligned in the frontal plane.  Bony pelvis intact.  Mild degenerative changes of the left hip.  No definite osseous abnormality or hardware abnormality.  External ice pack noted.  IMPRESSION: Expected appearance following right hip arthroplasty.   Original Report Authenticated By: Judie Petit. Shick, M.D.    Dg Hip Portable 1 View Right  01/18/2013  *RADIOLOGY REPORT*  Clinical Data: Right hip arthroplasty.  PORTABLE RIGHT HIP - 1 VIEW  Comparison: 01/18/2013  Findings: Portable cross-table lateral view demonstrates right hip arthroplasty changes.  Components appear aligned.  Air in the soft tissues from the recent surgery.  No definite hardware abnormality or osseous finding.  IMPRESSION: Expected appearance status post right hip arthroplasty.   Original Report Authenticated By: Judie Petit. Miles Costain, M.D.     Disposition: to home      Discharge Orders   Future Orders Complete By Expires     Call MD / Call 911  As directed     Comments:      If you experience chest pain or shortness of breath, CALL 911 and be transported to the hospital emergency room.  If you develope a fever above 101 F, pus (white drainage) or increased drainage or redness at the wound, or calf pain, call your surgeon's office.    Constipation Prevention  As  directed     Comments:      Drink plenty of fluids.  Prune juice may be helpful.  You may use a stool softener, such as Colace (over the counter) 100 mg twice a day.  Use MiraLax (over the counter) for constipation as needed.    Diet - low sodium heart healthy  As directed     Discharge instructions  As directed     Comments:      Increase your  activities as comfort allows. Expect right thigh, leg, ankle , and foot swelling.  Expect right knee pain. You can remove your dressing Monday am 3/10 and shower and get your incision wet; dry dressing daily after that. Get over-the-counter stool softener if needed.    Discharge patient  As directed     Increase activity slowly as tolerated  As directed        Follow-up Information   Follow up with Kathryne Hitch, MD In 2 weeks.   Contact information:   [address] [place] [address] [phone]        Signed: Kathryne Hitch 01/21/2013, 7:01 AM
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
## 2013-01-22 NOTE — Progress Notes (Signed)
Physical Therapy Treatment Patient Details Name: Melissa Grimes MRN: [id number] DOB: 02/07/1956 Today's Date: 01/22/2013 Time: 5784-6962 PT Time Calculation (min): 47 min  PT Assessment / Plan / Recommendation Comments on Treatment Session  Pt has progressed very well and is pleased that her pain is surgical and feels different than her arthritis pain did. She ambulated 150' with RW and performed steps again. She feels ready for d/c when MD clears her. PT will follow.     Follow Up Recommendations  Home health PT;Supervision/Assistance - 24 hour     Does the patient have the potential to tolerate intense rehabilitation     Barriers to Discharge        Equipment Recommendations  Rolling walker with 5" wheels;Other (comment) (3-in-1)    Recommendations for Other Services OT consult  Frequency 7X/week   Plan Discharge plan remains appropriate;Frequency remains appropriate    Precautions / Restrictions Precautions Precautions: None Precaution Comments: direct anterior approach no precautions.  Restrictions Weight Bearing Restrictions: Yes RLE Weight Bearing: Weight bearing as tolerated   Pertinent Vitals/Pain 3/10 right hip pain, premedicated    Mobility  Bed Mobility Bed Mobility: Supine to Sit;Sit to Supine Supine to Sit: 7: Independent Sitting - Scoot to Edge of Bed: 7: Independent Sit to Supine: 7: Independent Details for Bed Mobility Assistance: much improved Transfers Transfers: Sit to Stand;Stand to Sit Sit to Stand: 6: Modified independent (Device/Increase time);From bed;From chair/3-in-1 Stand to Sit: 6: Modified independent (Device/Increase time);To bed;To chair/3-in-1 Ambulation/Gait Ambulation/Gait Assistance: 5: Supervision Ambulation Distance (Feet): 150 Feet Assistive device: Rolling walker Ambulation/Gait Assistance Details: vc's for beginning to put less wt through RW Gait Pattern: Step-through pattern;Decreased stance time - right Gait velocity:  decreased Stairs: Yes Stairs Assistance: 5: Supervision Stairs Assistance Details (indicate cue type and reason): Tried stairs with only rail but pt felt that this put too much pressure on the RLE so tried with cane in left hand and then right which ended up being her preferred method. No physical assist neede  Stair Management Technique: Step to pattern;Forwards;With cane;One rail Left Number of Stairs: 5 Wheelchair Mobility Wheelchair Mobility: No    Exercises Total Joint Exercises Ankle Circles/Pumps: Both;10 reps Quad Sets: AROM;Both;10 reps Gluteal Sets: Both;10 reps;AROM;Right;Supine Heel Slides: AROM;Right;Supine;10 reps Hip ABduction/ADduction: AROM;10 reps;Right;Supine Straight Leg Raises: AAROM;Right;10 reps;Supine Long Arc Quad: AROM;10 reps;Right;Seated   PT Diagnosis:    PT Problem List:   PT Treatment Interventions:     PT Goals Acute Rehab PT Goals PT Goal Formulation: With patient Time For Goal Achievement: 01/25/13 Potential to Achieve Goals: Good Pt will go Supine/Side to Sit: with supervision PT Goal: Supine/Side to Sit - Progress: Met Pt will go Sit to Supine/Side: with supervision PT Goal: Sit to Supine/Side - Progress: Met Pt will go Sit to Stand: with supervision PT Goal: Sit to Stand - Progress: Met Pt will go Stand to Sit: with supervision PT Goal: Stand to Sit - Progress: Met Pt will Transfer Bed to Chair/Chair to Bed: with supervision PT Transfer Goal: Bed to Chair/Chair to Bed - Progress: Met Pt will Ambulate: 51 - 150 feet;with supervision;with rolling walker PT Goal: Ambulate - Progress: Met Pt will Go Up / Down Stairs: 3-5 stairs;with supervision;with least restrictive assistive device PT Goal: Up/Down Stairs - Progress: Progressing toward goal Pt will Perform Home Exercise Program: with supervision, verbal cues required/provided PT Goal: Perform Home Exercise Program - Progress: Progressing toward goal  Visit Information  Last PT Received  On: 01/22/13 Assistance Needed: +1  Subjective Data  Subjective: I am so glad that I am not having the same arthritis pain Patient Stated Goal: be able to walk without pain.   Cognition  Cognition Overall Cognitive Status: Appears within functional limits for tasks assessed/performed Arousal/Alertness: Awake/alert Orientation Level: Oriented X4 / Intact Behavior During Session: WFL for tasks performed    Balance  Balance Balance Assessed: Yes Static Standing Balance Static Standing - Balance Support: No upper extremity supported;During functional activity Static Standing - Level of Assistance: 5: Stand by assistance  End of Session PT - End of Session Equipment Utilized During Treatment: Gait belt Activity Tolerance: Patient tolerated treatment well Patient left: in chair;with call bell/phone within reach Nurse Communication: Mobility status   GP   Lyanne Co, PT  Acute Rehab Services  [phone]   Lyanne Co 01/22/2013, 1:15 PM

## 2013-04-15 ENCOUNTER — Telehealth: Payer: Self-pay | Admitting: *Deleted

## 2013-04-15 NOTE — Telephone Encounter (Signed)
Received call from pt requesting a refill of mimvey. Advised pt that we sent refills to her pharmacy on 12/01/12, #30 x 5 refills and pt should still have 1 refill left. Advised pt to talk with pharmacy and see if refills still remain on file. Pt voices understanding.

## 2013-06-27 ENCOUNTER — Other Ambulatory Visit: Payer: Self-pay | Admitting: Family

## 2013-06-27 NOTE — Telephone Encounter (Signed)
30 day supply of lisinopril sent to pharmacy. Pt is due for 6 month follow up this month; please call pt to arrange appt.

## 2013-06-27 NOTE — Telephone Encounter (Signed)
Left detailed message informing patient of medication refill and informed her that she needs to schedule an appointment sometime this month.

## 2013-06-28 ENCOUNTER — Other Ambulatory Visit (HOSPITAL_COMMUNITY): Payer: Self-pay | Admitting: Orthopaedic Surgery

## 2013-07-12 ENCOUNTER — Encounter: Payer: Self-pay | Admitting: Family

## 2013-07-12 ENCOUNTER — Ambulatory Visit (INDEPENDENT_AMBULATORY_CARE_PROVIDER_SITE_OTHER): Payer: BC Managed Care – PPO | Admitting: Family

## 2013-07-12 VITALS — BP 118/70 | HR 61 | Temp 98.2°F | Resp 16 | Ht 64.0 in | Wt 158.0 lb

## 2013-07-12 DIAGNOSIS — I10 Essential (primary) hypertension: Secondary | ICD-10-CM

## 2013-07-12 DIAGNOSIS — E785 Hyperlipidemia, unspecified: Secondary | ICD-10-CM

## 2013-07-12 DIAGNOSIS — M169 Osteoarthritis of hip, unspecified: Secondary | ICD-10-CM

## 2013-07-12 DIAGNOSIS — M1610 Unilateral primary osteoarthritis, unspecified hip: Secondary | ICD-10-CM

## 2013-07-12 LAB — BASIC METABOLIC PANEL
BUN: 18 mg/dL (ref 6–23)
CO2: 29 mEq/L (ref 19–32)
Calcium: 8.8 mg/dL (ref 8.4–10.5)
Chloride: 103 mEq/L (ref 96–112)
Creat: 0.66 mg/dL (ref 0.50–1.10)
Glucose, Bld: 86 mg/dL (ref 70–99)
Potassium: 4.3 mEq/L (ref 3.5–5.3)
Sodium: 139 mEq/L (ref 135–145)

## 2013-07-12 LAB — HEPATIC FUNCTION PANEL
ALT: 13 U/L (ref 0–35)
AST: 16 U/L (ref 0–37)
Albumin: 3.9 g/dL (ref 3.5–5.2)
Alkaline Phosphatase: 55 U/L (ref 39–117)
Bilirubin, Direct: 0.1 mg/dL (ref 0.0–0.3)
Indirect Bilirubin: 0.1 mg/dL (ref 0.0–0.9)
Total Bilirubin: 0.2 mg/dL — ABNORMAL LOW (ref 0.3–1.2)
Total Protein: 6.6 g/dL (ref 6.0–8.3)

## 2013-07-12 MED ORDER — PRAVASTATIN SODIUM 20 MG PO TABS: 20.0000 mg | ORAL_TABLET | Freq: Every day | ORAL | Status: DC

## 2013-07-12 MED ORDER — LISINOPRIL 20 MG PO TABS: ORAL_TABLET | ORAL | Status: DC

## 2013-07-12 NOTE — Assessment & Plan Note (Addendum)
Fair lipids. She plans to work on diet and exercise in the near future. Continue current dose of pravastatin. Check lft today.  Repeat flp in 6 months.

## 2013-07-12 NOTE — Patient Instructions (Addendum)
Please complete your lab work prior to leaving. Follow up in 6 months.  

## 2013-07-12 NOTE — Progress Notes (Signed)
Subjective:    Patient ID: Melissa Grimes, female    DOB: [birth year], 57 y.o.   MRN: [id number]  HPI  [name] is a 57 yr old female who presents today for follow up of multiple medical problems:  1) HTN-She is currently maintained on Lisinopril. She denies associated cough. Denies CP or SOB.   2) Hyperlidemia- she brings with her today some lab work from her employer. LDL was 106. Trigs 169, total cholesterol 211. She is maintained on pravastatin.   3) OA- she is scheduled for L THA on 08/09/13. She had a R THA in March and reports that she is doing very well following her surgery. She continue to have pain in the left hip and looks forward to having her left hip replacement.   Review of Systems See HPI  Past Medical History  Diagnosis Date  . Arthritis   . Hyperlipidemia   . Hypertension     History   Social History  . Marital Status: Married    Spouse Name: N/A    Number of Children: 2  . Years of Education: N/A   Occupational History  . Not on file.   Social History Main Topics  . Smoking status: Never Smoker   . Smokeless tobacco: Never Used  . Alcohol Use: 4.2 oz/week    7 Glasses of wine per week  . Drug Use: Not on file  . Sexual Activity: Not on file   Other Topics Concern  . Not on file   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 [place]   2 children- grown both in [place]   Enjoys writing christian ed material   1 cat    Past Surgical History  Procedure Laterality Date  . Osteotomy  1974 & 1975  . Cesarean section      x 2  . Hernia repair  1977    inguinal  . Bunionectomy    . Total hip arthroplasty Right 01/18/2013    Procedure: Right TOTAL HIP ARTHROPLASTY ANTERIOR APPROACH;  Surgeon: Kathryne Hitch, MD;  Location: [facility] OR;  Service: Orthopedics;  Laterality: Right;    Family History  Problem Relation Age of Onset  . Arthritis Mother     osateoarthritis  . Hyperlipidemia Mother    . Heart disease Mother   . Hypertension Mother   . Hyperlipidemia Father   . Heart disease Father   . Stroke Father   . Parkinson's disease Father   . Cancer Maternal Grandfather     lung  . Cancer Cousin     breast    Allergies  Allergen Reactions  . Achromycin [Tetracycline] Hives    Current Outpatient Prescriptions on File Prior to Visit  Medication Sig Dispense Refill  . aspirin EC 325 MG EC tablet Take 1 tablet (325 mg total) by mouth 2 (two) times daily.  60 tablet  0  . Calcium Citrate-Vitamin D 500-400 MG-UNIT CHEW Chew 2 tablets by mouth 2 (two) times daily.      Marland Kitchen estradiol-norethindrone (MIMVEY) 1-0.5 MG per tablet Take 1 tablet by mouth daily.  30 tablet  5  . Lactobacillus (ACIDOPHILUS) 100 MG CAPS Take 100 mg by mouth daily.      . Misc Natural Products (TART CHERRY ADVANCED) CAPS Take 3 capsules by mouth daily.      . Multiple Vitamin (MULTIVITAMIN WITH MINERALS) TABS Take 1 tablet by mouth daily. Centrum Silver      .  Omega-3 Fatty Acids (FISH OIL) 1200 MG CAPS Take 1 capsule by mouth daily.       No current facility-administered medications on file prior to visit.    BP 118/70  Pulse 61  Temp(Src) 98.2 F (36.8 C) (Oral)  Resp 16  Ht 5\' 4"  (1.626 m)  Wt 158 lb (71.668 kg)  BMI 27.11 kg/m2  SpO2 98%  LMP 11/17/2010       Objective:   Physical Exam  Constitutional: She is oriented to person, place, and time. She appears well-developed and well-nourished. No distress.  Cardiovascular: Normal rate and regular rhythm.   No murmur heard. Pulmonary/Chest: Effort normal and breath sounds normal. No respiratory distress. She has no wheezes. She has no rales. She exhibits no tenderness.  Musculoskeletal: She exhibits no edema.  Neurological: She is alert and oriented to person, place, and time.  Psychiatric: She has a normal mood and affect. Her behavior is normal. Judgment and thought content normal.          Assessment & Plan:

## 2013-07-12 NOTE — Assessment & Plan Note (Signed)
Pt anxiously awaiting THR.  Management per ortho.

## 2013-07-12 NOTE — Assessment & Plan Note (Signed)
BP stable on lisinopril. Check bmet.

## 2013-07-14 ENCOUNTER — Encounter: Payer: Self-pay | Admitting: Family

## 2013-07-29 ENCOUNTER — Encounter (HOSPITAL_COMMUNITY): Payer: Self-pay | Admitting: Pharmacy Technician

## 2013-08-01 ENCOUNTER — Encounter (HOSPITAL_COMMUNITY)
Admission: RE | Admit: 2013-08-01 | Discharge: 2013-08-01 | Disposition: A | Discharge status: Home / Self Care | Payer: BC Managed Care – PPO | Source: Ambulatory Visit | Attending: Orthopaedic Surgery | Admitting: Orthopaedic Surgery

## 2013-08-01 ENCOUNTER — Encounter (HOSPITAL_COMMUNITY): Payer: Self-pay

## 2013-08-01 DIAGNOSIS — Z01812 Encounter for preprocedural laboratory examination: Secondary | ICD-10-CM | POA: Insufficient documentation

## 2013-08-01 DIAGNOSIS — Z01818 Encounter for other preprocedural examination: Secondary | ICD-10-CM | POA: Insufficient documentation

## 2013-08-01 HISTORY — DX: Adverse effect of unspecified anesthetic, initial encounter: T41.45XA

## 2013-08-01 HISTORY — DX: Other complications of anesthesia, initial encounter: T88.59XA

## 2013-08-01 LAB — SURGICAL PCR SCREEN
MRSA, PCR: NEGATIVE
Staphylococcus aureus: NEGATIVE

## 2013-08-01 LAB — BASIC METABOLIC PANEL
BUN: 14 mg/dL (ref 6–23)
CO2: 26 mEq/L (ref 19–32)
Calcium: 9.7 mg/dL (ref 8.4–10.5)
Chloride: 100 mEq/L (ref 96–112)
Creatinine, Ser: 0.61 mg/dL (ref 0.50–1.10)
GFR calc Af Amer: >90 mL/min (ref >90)
GFR calc non Af Amer: >90 mL/min (ref >90)
Glucose, Bld: 88 mg/dL (ref 70–99)
Potassium: 3.8 mEq/L (ref 3.5–5.1)
Sodium: 135 mEq/L (ref 135–145)

## 2013-08-01 LAB — URINE MICROSCOPIC-ADD ON

## 2013-08-01 LAB — CBC
HCT: 41.3 % (ref 36.0–46.0)
Hemoglobin: 14.6 g/dL (ref 12.0–15.0)
MCH: 33.3 pg (ref 26.0–34.0)
MCHC: 35.4 g/dL (ref 30.0–36.0)
MCV: 94.3 fL (ref 78.0–100.0)
Platelets: 311 10*3/uL (ref 150–400)
RBC: 4.38 MIL/uL (ref 3.87–5.11)
RDW: 12.6 % (ref 11.5–15.5)
WBC: 6.8 10*3/uL (ref 4.0–10.5)

## 2013-08-01 LAB — URINALYSIS, ROUTINE W REFLEX MICROSCOPIC
Bilirubin Urine: NEGATIVE
Glucose, UA: NEGATIVE mg/dL
Ketones, ur: NEGATIVE mg/dL
Leukocytes, UA: NEGATIVE
Nitrite: NEGATIVE
Protein, ur: NEGATIVE mg/dL
Specific Gravity, Urine: 1.017 (ref 1.005–1.030)
Urobilinogen, UA: 0.2 mg/dL (ref 0.0–1.0)
pH: 5.5 (ref 5.0–8.0)

## 2013-08-01 LAB — TYPE AND SCREEN
ABO/RH(D): A POS
Antibody Screen: NEGATIVE

## 2013-08-01 NOTE — Pre-Procedure Instructions (Signed)
Melissa Grimes  08/01/2013   Your procedure is scheduled on:  Tuesday, September 23rd.  Report to  [facility], Main Entrance or Entrance "A" at 10:45 AM.   Call this number if you have problems the morning of surgery: [phone]   Remember:   Do not eat food or drink liquids after midnight.   Take these medicines the morning of surgery with A SIP OF WATER: estradiol-norethindrone (MIMVEY).  May take if needed: oxyCODONE-acetaminophen (PERCOCET/ROXICET) or acetaminophen (TYLENOL EX ST ARTHRITIS PAIN).  Stop taking Aspirin, Coumadin, Plavix, Effient and Herbal medications  (Fish Oil, Multivitamin).  Do not take any NSAIDs ie: Ibuprofen,  Advil,Naproxen or any medication containing Aspirin.      Do not wear jewelry, make-up or nail polish.  Do not wear lotions, powders, or perfumes. You may wear deodorant.  Do not shave 48 hours prior to surgery.   Do not bring valuables to the hospital.  [facility] is not responsible for any belongings or valuables.  Contacts, dentures or bridgework may not be worn into surgery.  Leave suitcase in the car. After surgery it may be brought to your room.  For patients admitted to the hospital, checkout time is 11:00 AM the day of discharge.     Special Instructions: Shower using CHG 2 nights before surgery and the night before surgery.  If you shower the day of surgery use CHG.  Use special wash - you have one bottle of CHG for all showers.  You should use approximately 1/3 of the bottle for each shower.   Please read over the following fact sheets that you were given: Pain Booklet, Coughing and Deep Breathing, Blood Transfusion Information and Surgical Site Infection Prevention

## 2013-08-08 MED ORDER — CEFAZOLIN SODIUM-DEXTROSE 2-3 GM-% IV SOLR
2.0000 g | INTRAVENOUS | Status: AC
  Administered 2013-08-09: 2 g via INTRAVENOUS
  Filled 2013-08-08: qty 50

## 2013-08-09 ENCOUNTER — Inpatient Hospital Stay (HOSPITAL_COMMUNITY)
Admission: RE | Admit: 2013-08-09 | Discharge: 2013-08-12 | DRG: 818 | Disposition: A | Discharge status: Home Health | Payer: BC Managed Care – PPO | Source: Ambulatory Visit | Attending: Orthopaedic Surgery | Admitting: Orthopaedic Surgery

## 2013-08-09 ENCOUNTER — Encounter (HOSPITAL_COMMUNITY): Payer: Self-pay | Admitting: Anesthesiology

## 2013-08-09 ENCOUNTER — Inpatient Hospital Stay (HOSPITAL_COMMUNITY): Payer: BC Managed Care – PPO | Admitting: Anesthesiology

## 2013-08-09 ENCOUNTER — Inpatient Hospital Stay (HOSPITAL_COMMUNITY): Payer: BC Managed Care – PPO

## 2013-08-09 ENCOUNTER — Encounter (HOSPITAL_COMMUNITY)
Admission: RE | Disposition: A | Discharge status: Home Health | Payer: Self-pay | Source: Ambulatory Visit | Attending: Orthopaedic Surgery

## 2013-08-09 DIAGNOSIS — Z96649 Presence of unspecified artificial hip joint: Secondary | ICD-10-CM

## 2013-08-09 DIAGNOSIS — I10 Essential (primary) hypertension: Secondary | ICD-10-CM | POA: Diagnosis present

## 2013-08-09 DIAGNOSIS — M169 Osteoarthritis of hip, unspecified: Principal | ICD-10-CM | POA: Diagnosis not present

## 2013-08-09 DIAGNOSIS — M1610 Unilateral primary osteoarthritis, unspecified hip: Principal | ICD-10-CM | POA: Diagnosis not present

## 2013-08-09 DIAGNOSIS — E785 Hyperlipidemia, unspecified: Secondary | ICD-10-CM | POA: Diagnosis present

## 2013-08-09 HISTORY — PX: TOTAL HIP ARTHROPLASTY: SHX124

## 2013-08-09 SURGERY — ARTHROPLASTY, HIP, TOTAL, ANTERIOR APPROACH
Anesthesia: General | Site: Hip | Laterality: Left | Wound class: Clean

## 2013-08-09 MED ORDER — DEXAMETHASONE SODIUM PHOSPHATE 4 MG/ML IJ SOLN
INTRAMUSCULAR | Status: DC | PRN
  Administered 2013-08-09: 4 mg via INTRAVENOUS

## 2013-08-09 MED ORDER — METOCLOPRAMIDE HCL 10 MG PO TABS
5.0000 mg | ORAL_TABLET | Freq: Three times a day (TID) | ORAL | Status: DC | PRN
  Administered 2013-08-10: 10 mg via ORAL
  Filled 2013-08-09: qty 1

## 2013-08-09 MED ORDER — ONDANSETRON HCL 4 MG/2ML IJ SOLN: 4.0000 mg | Freq: Once | INTRAMUSCULAR | Status: DC | PRN

## 2013-08-09 MED ORDER — METHOCARBAMOL 500 MG PO TABS
500.0000 mg | ORAL_TABLET | Freq: Four times a day (QID) | ORAL | Status: DC | PRN
  Administered 2013-08-09 – 2013-08-11 (×5): 500 mg via ORAL
  Filled 2013-08-09 (×6): qty 1

## 2013-08-09 MED ORDER — SODIUM CHLORIDE 0.9 % IR SOLN
Status: DC | PRN
  Administered 2013-08-09: 1000 mL

## 2013-08-09 MED ORDER — ACETAMINOPHEN 325 MG PO TABS: 650.0000 mg | ORAL_TABLET | Freq: Four times a day (QID) | ORAL | Status: DC | PRN

## 2013-08-09 MED ORDER — OXYCODONE HCL 5 MG PO TABS
5.0000 mg | ORAL_TABLET | ORAL | Status: DC | PRN
  Administered 2013-08-10 (×2): 5 mg via ORAL
  Administered 2013-08-10 – 2013-08-12 (×9): 10 mg via ORAL
  Filled 2013-08-09: qty 1
  Filled 2013-08-09: qty 2
  Filled 2013-08-09: qty 1
  Filled 2013-08-09 (×9): qty 2
  Filled 2013-08-09: qty 1
  Filled 2013-08-09: qty 2

## 2013-08-09 MED ORDER — HYDROMORPHONE HCL PF 1 MG/ML IJ SOLN
INTRAMUSCULAR | Status: AC
  Filled 2013-08-09: qty 1

## 2013-08-09 MED ORDER — FENTANYL CITRATE 0.05 MG/ML IJ SOLN
INTRAMUSCULAR | Status: DC | PRN
  Administered 2013-08-09: 50 ug via INTRAVENOUS
  Administered 2013-08-09: 150 ug via INTRAVENOUS
  Administered 2013-08-09 (×6): 50 ug via INTRAVENOUS

## 2013-08-09 MED ORDER — ASPIRIN EC 325 MG PO TBEC
325.0000 mg | DELAYED_RELEASE_TABLET | Freq: Every day | ORAL | Status: DC
  Administered 2013-08-10 – 2013-08-12 (×3): 325 mg via ORAL
  Filled 2013-08-09 (×4): qty 1

## 2013-08-09 MED ORDER — MIDAZOLAM HCL 5 MG/5ML IJ SOLN
INTRAMUSCULAR | Status: DC | PRN
  Administered 2013-08-09: 2 mg via INTRAVENOUS

## 2013-08-09 MED ORDER — METOCLOPRAMIDE HCL 5 MG/ML IJ SOLN: 5.0000 mg | Freq: Three times a day (TID) | INTRAMUSCULAR | Status: DC | PRN

## 2013-08-09 MED ORDER — LISINOPRIL 20 MG PO TABS
20.0000 mg | ORAL_TABLET | Freq: Every day | ORAL | Status: DC
  Administered 2013-08-09 – 2013-08-12 (×3): 20 mg via ORAL
  Filled 2013-08-09 (×4): qty 1

## 2013-08-09 MED ORDER — DOCUSATE SODIUM 100 MG PO CAPS
100.0000 mg | ORAL_CAPSULE | Freq: Two times a day (BID) | ORAL | Status: DC
  Administered 2013-08-09 – 2013-08-12 (×6): 100 mg via ORAL
  Filled 2013-08-09 (×6): qty 1

## 2013-08-09 MED ORDER — ARTIFICIAL TEARS OP OINT
TOPICAL_OINTMENT | OPHTHALMIC | Status: DC | PRN
  Administered 2013-08-09: 1 via OPHTHALMIC

## 2013-08-09 MED ORDER — 0.9 % SODIUM CHLORIDE (POUR BTL) OPTIME
TOPICAL | Status: DC | PRN
  Administered 2013-08-09: 1000 mL

## 2013-08-09 MED ORDER — PHENOL 1.4 % MT LIQD: 1.0000 | OROMUCOSAL | Status: DC | PRN

## 2013-08-09 MED ORDER — ALUM & MAG HYDROXIDE-SIMETH 200-200-20 MG/5ML PO SUSP
30.0000 mL | ORAL | Status: DC | PRN
  Administered 2013-08-10 – 2013-08-12 (×7): 30 mL via ORAL
  Filled 2013-08-09 (×7): qty 30

## 2013-08-09 MED ORDER — HYDROMORPHONE HCL PF 1 MG/ML IJ SOLN
0.2500 mg | INTRAMUSCULAR | Status: DC | PRN
  Administered 2013-08-09 (×4): 0.5 mg via INTRAVENOUS

## 2013-08-09 MED ORDER — SODIUM CHLORIDE 0.9 % IV SOLN
INTRAVENOUS | Status: DC
  Administered 2013-08-09 – 2013-08-10 (×2): via INTRAVENOUS

## 2013-08-09 MED ORDER — PNEUMOCOCCAL VAC POLYVALENT 25 MCG/0.5ML IJ INJ
0.5000 mL | INJECTION | INTRAMUSCULAR | Status: AC
  Filled 2013-08-09: qty 0.5

## 2013-08-09 MED ORDER — TRANEXAMIC ACID 100 MG/ML IV SOLN
1000.0000 mg | INTRAVENOUS | Status: AC
  Administered 2013-08-09: 1000 mg via INTRAVENOUS
  Filled 2013-08-09: qty 10

## 2013-08-09 MED ORDER — POLYETHYLENE GLYCOL 3350 17 G PO PACK
17.0000 g | PACK | Freq: Every day | ORAL | Status: DC | PRN
  Filled 2013-08-09: qty 1

## 2013-08-09 MED ORDER — SIMVASTATIN 10 MG PO TABS
10.0000 mg | ORAL_TABLET | Freq: Every day | ORAL | Status: DC
  Administered 2013-08-10 – 2013-08-11 (×2): 10 mg via ORAL
  Filled 2013-08-09 (×5): qty 1

## 2013-08-09 MED ORDER — PROPOFOL 10 MG/ML IV BOLUS
INTRAVENOUS | Status: DC | PRN
  Administered 2013-08-09: 130 mg via INTRAVENOUS

## 2013-08-09 MED ORDER — ONDANSETRON HCL 4 MG/2ML IJ SOLN
INTRAMUSCULAR | Status: DC | PRN
  Administered 2013-08-09: 4 mg via INTRAVENOUS

## 2013-08-09 MED ORDER — ONDANSETRON HCL 4 MG PO TABS: 4.0000 mg | ORAL_TABLET | Freq: Four times a day (QID) | ORAL | Status: DC | PRN

## 2013-08-09 MED ORDER — METHOCARBAMOL 500 MG PO TABS
ORAL_TABLET | ORAL | Status: AC
  Filled 2013-08-09: qty 1

## 2013-08-09 MED ORDER — OXYCODONE HCL 5 MG PO TABS
ORAL_TABLET | ORAL | Status: AC
  Administered 2013-08-09: 5 mg
  Filled 2013-08-09: qty 1

## 2013-08-09 MED ORDER — ONDANSETRON HCL 4 MG/2ML IJ SOLN: 4.0000 mg | Freq: Four times a day (QID) | INTRAMUSCULAR | Status: DC | PRN

## 2013-08-09 MED ORDER — MENTHOL 3 MG MT LOZG: 1.0000 | LOZENGE | OROMUCOSAL | Status: DC | PRN

## 2013-08-09 MED ORDER — DIPHENHYDRAMINE HCL 12.5 MG/5ML PO ELIX
12.5000 mg | ORAL_SOLUTION | ORAL | Status: DC | PRN
  Administered 2013-08-10: 25 mg via ORAL
  Filled 2013-08-09: qty 10

## 2013-08-09 MED ORDER — ZOLPIDEM TARTRATE 5 MG PO TABS: 5.0000 mg | ORAL_TABLET | Freq: Every evening | ORAL | Status: DC | PRN

## 2013-08-09 MED ORDER — NEOSTIGMINE METHYLSULFATE 1 MG/ML IJ SOLN
INTRAMUSCULAR | Status: DC | PRN
  Administered 2013-08-09: 2 mg via INTRAVENOUS

## 2013-08-09 MED ORDER — ESTRADIOL-NORETHINDRONE ACET 1-0.5 MG PO TABS
1.0000 | ORAL_TABLET | Freq: Every day | ORAL | Status: DC
  Administered 2013-08-10: 1 via ORAL

## 2013-08-09 MED ORDER — INFLUENZA VAC SPLIT QUAD 0.5 ML IM SUSP
0.5000 mL | INTRAMUSCULAR | Status: AC
  Filled 2013-08-09: qty 0.5

## 2013-08-09 MED ORDER — OXYCODONE HCL ER 10 MG PO T12A
10.0000 mg | EXTENDED_RELEASE_TABLET | Freq: Two times a day (BID) | ORAL | Status: DC
  Administered 2013-08-09 – 2013-08-12 (×6): 10 mg via ORAL
  Filled 2013-08-09 (×6): qty 1

## 2013-08-09 MED ORDER — CEFAZOLIN SODIUM 1-5 GM-% IV SOLN
1.0000 g | Freq: Four times a day (QID) | INTRAVENOUS | Status: AC
  Administered 2013-08-09 (×2): 1 g via INTRAVENOUS
  Filled 2013-08-09 (×2): qty 50

## 2013-08-09 MED ORDER — ACETAMINOPHEN 650 MG RE SUPP: 650.0000 mg | Freq: Four times a day (QID) | RECTAL | Status: DC | PRN

## 2013-08-09 MED ORDER — GLYCOPYRROLATE 0.2 MG/ML IJ SOLN
INTRAMUSCULAR | Status: DC | PRN
  Administered 2013-08-09: 0.4 mg via INTRAVENOUS

## 2013-08-09 MED ORDER — METHOCARBAMOL 100 MG/ML IJ SOLN
500.0000 mg | Freq: Four times a day (QID) | INTRAVENOUS | Status: DC | PRN
  Filled 2013-08-09: qty 5

## 2013-08-09 MED ORDER — LIDOCAINE HCL (CARDIAC) 20 MG/ML IV SOLN
INTRAVENOUS | Status: DC | PRN
  Administered 2013-08-09: 60 mg via INTRAVENOUS

## 2013-08-09 MED ORDER — HYDROMORPHONE HCL PF 1 MG/ML IJ SOLN
1.0000 mg | INTRAMUSCULAR | Status: DC | PRN
  Administered 2013-08-09 – 2013-08-10 (×3): 1 mg via INTRAVENOUS
  Filled 2013-08-09 (×4): qty 1

## 2013-08-09 MED ORDER — ROCURONIUM BROMIDE 100 MG/10ML IV SOLN
INTRAVENOUS | Status: DC | PRN
  Administered 2013-08-09: 50 mg via INTRAVENOUS

## 2013-08-09 MED ORDER — LACTATED RINGERS IV SOLN
INTRAVENOUS | Status: DC
  Administered 2013-08-09 (×3): via INTRAVENOUS

## 2013-08-09 SURGICAL SUPPLY — 52 items
BANDAGE GAUZE ELAST BULKY 4 IN (GAUZE/BANDAGES/DRESSINGS) IMPLANT
BLADE SAW SGTL 18X1.27X75 (BLADE) ×2 IMPLANT
BLADE SURG ROTATE 9660 (MISCELLANEOUS) IMPLANT
CAPT HIP PF COP ×2 IMPLANT
CELLS DAT CNTRL 66122 CELL SVR (MISCELLANEOUS) ×1 IMPLANT
CLOTH BEACON ORANGE TIMEOUT ST (SAFETY) ×2 IMPLANT
COVER BACK TABLE 24X17X13 BIG (DRAPES) IMPLANT
COVER SURGICAL LIGHT HANDLE (MISCELLANEOUS) ×2 IMPLANT
DERMABOND ADHESIVE PROPEN (GAUZE/BANDAGES/DRESSINGS) ×1
DERMABOND ADVANCED .7 DNX6 (GAUZE/BANDAGES/DRESSINGS) ×1 IMPLANT
DRAPE C-ARM 42X72 X-RAY (DRAPES) ×2 IMPLANT
DRAPE STERI IOBAN 125X83 (DRAPES) ×2 IMPLANT
DRAPE U-SHAPE 47X51 STRL (DRAPES) ×6 IMPLANT
DRSG AQUACEL AG ADV 3.5X10 (GAUZE/BANDAGES/DRESSINGS) ×2 IMPLANT
DURAPREP 26ML APPLICATOR (WOUND CARE) ×2 IMPLANT
ELECT BLADE 4.0 EZ CLEAN MEGAD (MISCELLANEOUS)
ELECT BLADE TIP CTD 4 INCH (ELECTRODE) ×2 IMPLANT
ELECT CAUTERY BLADE 6.4 (BLADE) ×2 IMPLANT
ELECT REM PT RETURN 9FT ADLT (ELECTROSURGICAL) ×2
ELECTRODE BLDE 4.0 EZ CLN MEGD (MISCELLANEOUS) IMPLANT
ELECTRODE REM PT RTRN 9FT ADLT (ELECTROSURGICAL) ×1 IMPLANT
FACESHIELD LNG OPTICON STERILE (SAFETY) ×4 IMPLANT
GAUZE XEROFORM 1X8 LF (GAUZE/BANDAGES/DRESSINGS) ×2 IMPLANT
GLOVE BIOGEL PI IND STRL 8 (GLOVE) ×2 IMPLANT
GLOVE BIOGEL PI INDICATOR 8 (GLOVE) ×2
GLOVE ECLIPSE 8.0 STRL XLNG CF (GLOVE) ×2 IMPLANT
GLOVE SURG ORTHO 8.0 STRL STRW (GLOVE) ×2 IMPLANT
GOWN STRL REIN XL XLG (GOWN DISPOSABLE) ×4 IMPLANT
HANDPIECE INTERPULSE COAX TIP (DISPOSABLE) ×1
KIT BASIN OR (CUSTOM PROCEDURE TRAY) ×2 IMPLANT
KIT ROOM TURNOVER OR (KITS) ×2 IMPLANT
MANIFOLD NEPTUNE II (INSTRUMENTS) ×2 IMPLANT
NS IRRIG 1000ML POUR BTL (IV SOLUTION) ×2 IMPLANT
PACK TOTAL JOINT (CUSTOM PROCEDURE TRAY) ×2 IMPLANT
PAD ARMBOARD 7.5X6 YLW CONV (MISCELLANEOUS) ×4 IMPLANT
RTRCTR WOUND ALEXIS 18CM MED (MISCELLANEOUS) ×2
SET HNDPC FAN SPRY TIP SCT (DISPOSABLE) ×1 IMPLANT
SPONGE LAP 18X18 X RAY DECT (DISPOSABLE) IMPLANT
SPONGE LAP 4X18 X RAY DECT (DISPOSABLE) IMPLANT
STAPLER VISISTAT 35W (STAPLE) ×2 IMPLANT
SUT ETHIBOND NAB CT1 #1 30IN (SUTURE) ×4 IMPLANT
SUT MNCRL AB 3-0 PS2 27 (SUTURE) ×2 IMPLANT
SUT VIC AB 0 CT1 27 (SUTURE) ×2
SUT VIC AB 0 CT1 27XBRD ANBCTR (SUTURE) ×2 IMPLANT
SUT VIC AB 1 CT1 27 (SUTURE) ×2
SUT VIC AB 1 CT1 27XBRD ANBCTR (SUTURE) ×2 IMPLANT
SUT VIC AB 2-0 CT1 27 (SUTURE) ×2
SUT VIC AB 2-0 CT1 TAPERPNT 27 (SUTURE) ×2 IMPLANT
TOWEL OR 17X24 6PK STRL BLUE (TOWEL DISPOSABLE) ×2 IMPLANT
TOWEL OR 17X26 10 PK STRL BLUE (TOWEL DISPOSABLE) ×2 IMPLANT
TRAY FOLEY CATH 16FRSI W/METER (SET/KITS/TRAYS/PACK) IMPLANT
WATER STERILE IRR 1000ML POUR (IV SOLUTION) ×4 IMPLANT

## 2013-08-09 NOTE — Anesthesia Preprocedure Evaluation (Addendum)
Anesthesia Evaluation  Patient identified by MRN, date of birth, ID band Patient awake    Reviewed: Allergy & Precautions, H&P , NPO status , Patient's Chart, lab work & pertinent test results, reviewed documented beta blocker date and time   Airway Mallampati: I TM Distance: >3 FB Neck ROM: full    Dental  (+) Teeth Intact   Pulmonary          Cardiovascular hypertension,     Neuro/Psych    GI/Hepatic   Endo/Other    Renal/GU      Musculoskeletal  (+) Arthritis -, Osteoarthritis,    Abdominal   Peds  Hematology   Anesthesia Other Findings   Reproductive/Obstetrics                        Anesthesia Physical Anesthesia Plan  ASA: II  Anesthesia Plan: General   Post-op Pain Management:    Induction: Intravenous  Airway Management Planned: Oral ETT  Additional Equipment:   Intra-op Plan:   Post-operative Plan: Extubation in OR  Informed Consent: I have reviewed the patients History and Physical, chart, labs and discussed the procedure including the risks, benefits and alternatives for the proposed anesthesia with the patient or authorized representative who has indicated his/her understanding and acceptance.   Dental advisory given and Dental Advisory Given  Plan Discussed with: CRNA and Anesthesiologist  Anesthesia Plan Comments:        Anesthesia Quick Evaluation

## 2013-08-09 NOTE — Anesthesia Postprocedure Evaluation (Signed)
  Anesthesia Post-op Note  Patient: Melissa Grimes  Procedure(s) Performed: Procedure(s): LEFT TOTAL HIP ARTHROPLASTY ANTERIOR APPROACH (Left)  Patient Location: PACU  Anesthesia Type:General  Level of Consciousness: awake, alert , oriented and patient cooperative  Airway and Oxygen Therapy: Patient Spontanous Breathing  Post-op Pain: mild  Post-op Assessment: Post-op Vital signs reviewed, Patient's Cardiovascular Status Stable, Respiratory Function Stable, Patent Airway, No signs of Nausea or vomiting and Pain level controlled  Post-op Vital Signs: stable  Complications: No apparent anesthesia complications

## 2013-08-09 NOTE — H&P (Signed)
TOTAL HIP ADMISSION H&P  Patient is admitted for left total hip arthroplasty.  Subjective:  Chief Complaint: left hip pain  HPI: Melissa Grimes, 57 y.o. female, has a history of pain and functional disability in the left hip(s) due to arthritis and patient has failed non-surgical conservative treatments for greater than 12 weeks to include NSAID's and/or analgesics, corticosteriod injections, use of assistive devices and activity modification.  Onset of symptoms was gradual starting 4 years ago with gradually worsening course since that time.The patient noted no past surgery on the left hip(s).  Patient currently rates pain in the left hip at 10 out of 10 with activity. Patient has night pain, worsening of pain with activity and weight bearing, trendelenberg gait, pain that interfers with activities of daily living and pain with passive range of motion. Patient has evidence of subchondral sclerosis, joint subluxation and joint space narrowing by imaging studies. This condition presents safety issues increasing the risk of falls.  There is no current active infection.  Patient Active Problem List   Diagnosis Date Noted  . Arthritis pain of left hip 08/09/2013  . Degenerative arthritis of hip 01/18/2013  . Routine general medical examination at a health care facility 12/22/2012  . HTN (hypertension) 12/01/2012  . Hyperlipidemia 12/01/2012  . Osteoarthritis 12/01/2012  . Fibroid uterus 12/01/2012   Past Medical History  Diagnosis Date  . Arthritis   . Hyperlipidemia   . Hypertension   . Complication of anesthesia     difficulty urinating after    Past Surgical History  Procedure Laterality Date  . Osteotomy  1974 & 1975  . Cesarean section      x 2  . Bunionectomy    . Total hip arthroplasty Right 01/18/2013    Procedure: Right TOTAL HIP ARTHROPLASTY ANTERIOR APPROACH;  Surgeon: Kathryne Hitch, MD;  Location: [facility] OR;  Service: Orthopedics;  Laterality: Right;  . Hernia repair  Left 1977    inguinal    Prescriptions prior to admission  Medication Sig Dispense Refill  . acetaminophen (TYLENOL EX ST ARTHRITIS PAIN) 500 MG tablet Take 1,000 mg by mouth every 6 (six) hours as needed for pain.      . Calcium Citrate-Vitamin D 500-400 MG-UNIT CHEW Chew 2 tablets by mouth 2 (two) times daily.      Marland Kitchen estradiol-norethindrone (MIMVEY) 1-0.5 MG per tablet Take 1 tablet by mouth daily.  30 tablet  5  . HYDROcodone-acetaminophen (NORCO/VICODIN) 5-325 MG per tablet Take 2 tablets by mouth every 12 (twelve) hours as needed for pain.      . Lactobacillus (ACIDOPHILUS) 100 MG CAPS Take 100 mg by mouth daily.      Marland Kitchen lisinopril (PRINIVIL,ZESTRIL) 20 MG tablet TAKE 1 TABLET (20 MG TOTAL) BY MOUTH DAILY.  30 tablet  5  . Multiple Vitamin (MULTIVITAMIN WITH MINERALS) TABS Take 1 tablet by mouth daily. Centrum Silver      . Omega-3 Fatty Acids (FISH OIL) 1200 MG CAPS Take 1 capsule by mouth daily.      Marland Kitchen oxyCODONE-acetaminophen (PERCOCET/ROXICET) 5-325 MG per tablet Take 1-2 tablets by mouth at bedtime as needed for pain.      . pravastatin (PRAVACHOL) 20 MG tablet Take 1 tablet (20 mg total) by mouth daily.  30 tablet  5   Allergies  Allergen Reactions  . Achromycin [Tetracycline] Hives    History  Substance Use Topics  . Smoking status: Never Smoker   . Smokeless tobacco: Never Used  . Alcohol Use: 1.8  oz/week    3 Glasses of wine per week    Family History  Problem Relation Age of Onset  . Arthritis Mother     osateoarthritis  . Hyperlipidemia Mother   . Heart disease Mother   . Hypertension Mother   . Hyperlipidemia Father   . Heart disease Father   . Stroke Father   . Parkinson's disease Father   . Cancer Maternal Grandfather     lung  . Cancer Cousin     breast     Review of Systems  Musculoskeletal: Positive for joint pain.  All other systems reviewed and are negative.    Objective:  Physical Exam  Constitutional: She is oriented to person, place, and  time. She appears well-developed and well-nourished.  HENT:  Head: Normocephalic and atraumatic.  Eyes: EOM are normal. Pupils are equal, round, and reactive to light.  Neck: Normal range of motion. Neck supple.  Cardiovascular: Normal rate and regular rhythm.   Respiratory: Effort normal and breath sounds normal.  GI: Soft. Bowel sounds are normal.  Musculoskeletal:       Left hip: She exhibits decreased range of motion, decreased strength and bony tenderness.  Neurological: She is alert and oriented to person, place, and time.  Skin: Skin is warm and dry.  Psychiatric: She has a normal mood and affect.    Vital signs in last 24 hours: Temp:  [97.8 F (36.6 C)] 97.8 F (36.6 C) (09/23 1043) Pulse Rate:  [51] 51 (09/23 1043) Resp:  [18] 18 (09/23 1043) BP: (147)/(82) 147/82 mmHg (09/23 1043) SpO2:  [100 %] 100 % (09/23 1043)  Labs:   Estimated body mass index is 26.32 kg/(m^2) as calculated from the following:   Height as of 08/01/13: 5\' 4"  (1.626 m).   Weight as of 01/11/13: 69.599 kg (153 lb 7 oz).   Imaging Review Plain radiographs demonstrate severe degenerative joint disease of the left hip(s). The bone quality appears to be excellent for age and reported activity level.  Assessment/Plan:  End stage arthritis, left hip(s)  The patient history, physical examination, clinical judgement of the provider and imaging studies are consistent with end stage degenerative joint disease of the left hip(s) and total hip arthroplasty is deemed medically necessary. The treatment options including medical management, injection therapy, arthroscopy and arthroplasty were discussed at length. The risks and benefits of total hip arthroplasty were presented and reviewed. The risks due to aseptic loosening, infection, stiffness, dislocation/subluxation,  thromboembolic complications and other imponderables were discussed.  The patient acknowledged the explanation, agreed to proceed with the plan  and consent was signed. Patient is being admitted for inpatient treatment for surgery, pain control, PT, OT, prophylactic antibiotics, VTE prophylaxis, progressive ambulation and ADL's and discharge planning.The patient is planning to be discharged home with home health services

## 2013-08-09 NOTE — Progress Notes (Signed)
Orthopedic Tech Progress Note Patient Details:  Starlyn Droge Jun 27, 1956 [id number]  Patient ID: Creola Corn, female   DOB: [birth year], 57 y.o.   MRN: [id number] Trapeze bar patient helper; viewed order from doctor's order list  Nikki Dom 08/09/2013, 5:45 PM

## 2013-08-09 NOTE — Brief Op Note (Signed)
08/09/2013  2:30 PM  PATIENT:  Creola Corn  57 y.o. female  PRE-OPERATIVE DIAGNOSIS:  Severe osteoarthritis left hip  POST-OPERATIVE DIAGNOSIS:  Severe osteoarthritis left hip  PROCEDURE:  Procedure(s): LEFT TOTAL HIP ARTHROPLASTY ANTERIOR APPROACH (Left)  SURGEON:  Surgeon(s) and Role:    * Kathryne Hitch, MD - Primary  PHYSICIAN ASSISTANT: Rexene Edison, PA-C  ANESTHESIA:   general  EBL:  Total I/O In: 2000 [I.V.:2000] Out: 900 [Urine:600; Blood:300]  BLOOD ADMINISTERED:none  DRAINS: none   LOCAL MEDICATIONS USED:  NONE  SPECIMEN:  No Specimen  DISPOSITION OF SPECIMEN:  N/A  COUNTS:  YES  TOURNIQUET:  * No tourniquets in log *  DICTATION: .Other Dictation: Dictation Number [id number]  PLAN OF CARE: Admit to inpatient   PATIENT DISPOSITION:  PACU - hemodynamically stable.   Delay start of Pharmacological VTE agent (>24hrs) due to surgical blood loss or risk of bleeding: no

## 2013-08-09 NOTE — Transfer of Care (Signed)
Immediate Anesthesia Transfer of Care Note  Patient: Melissa Grimes  Procedure(s) Performed: Procedure(s): LEFT TOTAL HIP ARTHROPLASTY ANTERIOR APPROACH (Left)  Patient Location: PACU  Anesthesia Type:General  Level of Consciousness: sedated  Airway & Oxygen Therapy: Patient Spontanous Breathing and Patient connected to nasal cannula oxygen  Post-op Assessment: Report given to PACU RN and Post -op Vital signs reviewed and stable  Post vital signs: stable  Complications: No apparent anesthesia complications

## 2013-08-10 LAB — CBC
HCT: 34.9 % — ABNORMAL LOW (ref 36.0–46.0)
Hemoglobin: 11.8 g/dL — ABNORMAL LOW (ref 12.0–15.0)
MCH: 32.2 pg (ref 26.0–34.0)
MCHC: 33.8 g/dL (ref 30.0–36.0)
MCV: 95.4 fL (ref 78.0–100.0)
Platelets: 267 10*3/uL (ref 150–400)
RBC: 3.66 MIL/uL — ABNORMAL LOW (ref 3.87–5.11)
RDW: 12.5 % (ref 11.5–15.5)
WBC: 9.3 10*3/uL (ref 4.0–10.5)

## 2013-08-10 LAB — BASIC METABOLIC PANEL
BUN: 8 mg/dL (ref 6–23)
CO2: 26 mEq/L (ref 19–32)
Calcium: 8.3 mg/dL — ABNORMAL LOW (ref 8.4–10.5)
Chloride: 103 mEq/L (ref 96–112)
Creatinine, Ser: 0.62 mg/dL (ref 0.50–1.10)
GFR calc Af Amer: >90 mL/min (ref >90)
GFR calc non Af Amer: >90 mL/min (ref >90)
Glucose, Bld: 111 mg/dL — ABNORMAL HIGH (ref 70–99)
Potassium: 3.4 mEq/L — ABNORMAL LOW (ref 3.5–5.1)
Sodium: 136 mEq/L (ref 135–145)

## 2013-08-10 MED ORDER — INFLUENZA VAC SPLIT QUAD 0.5 ML IM SUSP
0.5000 mL | INTRAMUSCULAR | Status: AC
  Administered 2013-08-11: 0.5 mL via INTRAMUSCULAR
  Filled 2013-08-10 (×2): qty 0.5

## 2013-08-10 MED ORDER — PNEUMOCOCCAL VAC POLYVALENT 25 MCG/0.5ML IJ INJ
0.5000 mL | INJECTION | INTRAMUSCULAR | Status: AC
  Filled 2013-08-10 (×2): qty 0.5

## 2013-08-10 MED ORDER — CELECOXIB 200 MG PO CAPS
200.0000 mg | ORAL_CAPSULE | Freq: Once | ORAL | Status: AC
  Administered 2013-08-10: 200 mg via ORAL
  Filled 2013-08-10: qty 1

## 2013-08-10 MED ORDER — GABAPENTIN 300 MG PO CAPS
300.0000 mg | ORAL_CAPSULE | Freq: Every day | ORAL | Status: DC
  Administered 2013-08-10 – 2013-08-11 (×2): 300 mg via ORAL
  Filled 2013-08-10 (×3): qty 1

## 2013-08-10 MED ORDER — ESTRADIOL-NORETHINDRONE ACET 1-0.5 MG PO TABS
1.0000 | ORAL_TABLET | Freq: Every day | ORAL | Status: DC
  Administered 2013-08-11 – 2013-08-12 (×2): 1 via ORAL

## 2013-08-10 NOTE — Progress Notes (Signed)
UR COMPLETED  

## 2013-08-10 NOTE — Progress Notes (Signed)
Subjective: 1 Day Post-Op Procedure(s) (LRB): LEFT TOTAL HIP ARTHROPLASTY ANTERIOR APPROACH (Left) Patient reports pain as mild.  No acute changes overnight. Labs pending.  Objective: Vital signs in last 24 hours: Temp:  [97.7 F (36.5 C)-98.6 F (37 C)] 97.9 F (36.6 C) (09/24 0545) Pulse Rate:  [51-71] 64 (09/24 0545) Resp:  [13-18] 18 (09/24 0545) BP: (116-147)/(56-99) 118/68 mmHg (09/24 0545) SpO2:  [99 %-100 %] 99 % (09/24 0545) FiO2 (%):  [28 %] 28 % (09/23 1713)  Intake/Output from previous day: 09/23 0701 - 09/24 0700 In: 3000 [P.O.:600; I.V.:2350; IV Piggyback:50] Out: 3000 [Urine:2700; Blood:300] Intake/Output this shift:    No results found for this basename: HGB,  in the last 72 hours No results found for this basename: WBC, RBC, HCT, PLT,  in the last 72 hours No results found for this basename: NA, K, CL, CO2, BUN, CREATININE, GLUCOSE, CALCIUM,  in the last 72 hours No results found for this basename: LABPT, INR,  in the last 72 hours  Sensation intact distally Intact pulses distally Dorsiflexion/Plantar flexion intact Incision: dressing C/D/I  Assessment/Plan: 1 Day Post-Op Procedure(s) (LRB): LEFT TOTAL HIP ARTHROPLASTY ANTERIOR APPROACH (Left) Up with therapy, WBAT left hip  Melissa Grimes Y 08/10/2013, 7:10 AM

## 2013-08-10 NOTE — Progress Notes (Signed)
OT Cancellation Note  Patient Details Name: Melissa Grimes MRN: [id number] DOB: 1956/10/25   Cancelled Treatment:   Patient reports LLE "nerve pain" and very sleepy.  Patient requesting OT return later this PM.  Will return as time allows.  Chaske Paskett 08/10/2013, 2:53 PM

## 2013-08-10 NOTE — Op Note (Signed)
NAMEJONTE, Melissa Grimes NO.:  [id number]  MEDICAL RECORD NO.:  [id number]  LOCATION:  [id number]                        FACILITY:  [facility]  PHYSICIAN:  Melissa Grimes, M.D.DATE OF BIRTH:  1956/09/09  DATE OF PROCEDURE:  08/09/2013 DATE OF DISCHARGE:                              OPERATIVE REPORT   PREOPERATIVE DIAGNOSIS:  Severe end-stage arthritis and degenerative joint disease, left hip.  POSTOP DIAGNOSIS:  Severe end-stage arthritis and degenerative joint disease, left hip.  PROCEDURE:  Left total hip arthroplasty with direct anterior approach.  IMPLANTS:  DePuy Sector Gription acetabular component size 48, 1 screw and size 32+ 4 neutral polyethylene liner, size 9 Corail femoral component with standard offset, size 36+ 5 ceramic hip ball.  SURGEON:  Melissa Grimes, M.D.  ASSISTING:  Melissa Canal, PA-C  ANESTHESIA:  General.  ANTIBIOTICS:  2 g IV Ancef.  BLOOD LOSS:  Less than 200 mL.  COMPLICATIONS:  None.  INDICATIONS:  Melissa Grimes is a 57 year old female with bilateral hip severe end-stage arthritis.  She underwent a successful right total hip arthroplasty earlier this year, now presents for her left hip to be replaced.  She has daily pain, her mobility is significantly limited, her quality of life is greatly diminished.  At this point, she wished to proceed with a total hip arthroplasty.  She understands the risk of acute blood loss anemia, nerve and vessel injury, infection, fracture, and DVT.  She understands the goals, improved mobility, decreased pain, and overall improved quality of life.  PROCEDURE:  After informed consent was obtained, appropriate left hip was marked.  She was brought to the operating room,  general anesthesia was obtained while she was on her stretcher.  Foley catheter was placed and then traction boots were placed on both of her feet.  She was next placed supine on the Hana fracture table with perineal  post in place and both legs in inline skeletal traction, but no traction applied.  Her left operative hip was prepped and draped with DuraPrep and sterile drapes.  Time-out was called.  She was identified as correct patient, correct left hip.  I then made an incision inferior and posterior to the anterior superior iliac spine and carried this obliquely down the leg. I dissected down to the tensor fascia lata muscle and the tensor fascia was divided longitudinally.  I then proceeded with a direct anterior approach to the hip.  We cauterized the lateral femoral circumflex vessels and the Cobra retractors placed along the lateral neck and one up underneath the rectus femoris under the medial neck.  I then opened up the hip capsule in a L-type format and a large effusion was counted. We placed the Cobra retractors within the hip capsule and I made our femoral neck cut just above the lesser trochanter with an oscillating saw and completed this an osteotome.  We placed a corkscrew guide in the femoral head and removed this in its entirety.  We then placed a bent Hohmann medially and a Cobra retractor laterally around the acetabular rim.  We cleaned the debris within the acetabulum and reamers to the acetabular labrum.  I then began reaming from a size 42  reamer in 2 mm increments up to a size 40 with all reamers placed under direct visualization and the last reamer placed under direct fluoroscopy as well.  So, we could obtain our depth of reaming, my inclination and anteversion.  Once I was pleased with this, I placed the real DePuy Sector Gription acetabular component size 48 and a single screw.  The apex hole eliminator guide and a 32+ 4 and real neutral polyethylene liner.  Attention was then turned to the femur with the leg externally rotated to about 90 degrees extended to 80,  adducted.  I placed a Mueller retractor medially and a Hohmann retractor behind the greater trochanter.  I  released the lateral joint capsule, used a box cutting osteotome to open up the femoral Grimes and then used a rongeur to lateralize.  We broached using the Corail broaching system, a size 8 and then size 9 on the side.  Now, we trialed a standard neck and a 36+ 1.5 hip ball.  We brought the leg back over with traction and internal rotation reducing the pelvis and it was stable,  just a little bit short, so we just looked at and trialed a +5 hip ball.  We brought the leg back up and over, then traction and internal rotation reduced it and it was stable with throughout her arc of motion with minimal shuck and leg lengths were measured equal.  We then dislocated the hip and removed the trial femoral component, then trial neck and head.  We placed the real Corail femoral component with HA coating and standard offset and the real 36+ 5 ceramic hip ball reduced this back in the acetabulum. Again it was stable.  We copiously irrigated the soft tissues normal saline solution.  We closed the joint capsule with interrupted #1 Ethibond suture followed by running #1 Vicryl in the tensor fascia, 0 Vicryl in the deep tissue, 2-0 Vicryl in subcutaneous tissue, staples on the skin, Xeroform and Aquacel dressing.  She was then taken off the Hana table, awakened, extubated, and taken to recovery room in stable condition.  All final counts were correct.  There were no complications noted.  Of note, Melissa Canal, PA-C was present the entire case.  His presence was crucial throughout the case completing a good case including retraction and closing.     Melissa Grimes, M.D.     CYB/MEDQ  D:  08/09/2013  T:  08/10/2013  Job:  [id number]

## 2013-08-10 NOTE — Evaluation (Signed)
Physical Therapy Evaluation Patient Details Name: Melissa Grimes MRN: [id number] DOB: August 23, 1956 Today's Date: 08/10/2013 Time: 4098-1191 PT Time Calculation (min): 32 min  PT Assessment / Plan / Recommendation History of Present Illness  s/p elective Lt THA  Clinical Impression  Pt is s/p Lt anterior THA POD#1 resulting in the deficits listed below (see PT Problem List).  Pt will benefit from skilled PT to increase their independence and safety with mobility to allow discharge to the venue listed below. Pt very fatigued at end of session but was conversing. Pt will need to be mod I prior to D/C; will have husband with her but not during the day due to him having to work.      PT Assessment  Patient needs continued PT services    Follow Up Recommendations  Home health PT;Supervision/Assistance - 24 hour    Does the patient have the potential to tolerate intense rehabilitation      Barriers to Discharge Decreased caregiver support pt will need to be mod I prior to D/c; will be home alone during the day while husband works    Equipment Recommendations  None recommended by PT    Recommendations for [employer] OT consult   Frequency 7X/week    Precautions / Restrictions Precautions Precautions: Fall Precaution Comments: No hip precautions- anterior hip; pt reports she "passed out last time she had surgery due to low BP"  Restrictions Weight Bearing Restrictions: Yes LLE Weight Bearing: Weight bearing as tolerated   Pertinent Vitals/Pain 2/10; BP sitting EOB 140/68; BP standing 100/77. RN notified. patient repositioned for comfort in chair        Mobility  Bed Mobility Bed Mobility: Supine to Sit;Sitting - Scoot to Edge of Bed Supine to Sit: 4: Min guard;With rails;HOB elevated Sitting - Scoot to Edge of Bed: 4: Min guard;With rail Details for Bed Mobility Assistance: pt requires increased time due to pain; pt with difficulty manuvering in bed with HOB elevated and  handrails; min guard to manage Lt LE and control descent of Lt LE to floor  Transfers Transfers: Sit to Stand;Stand to Sit Sit to Stand: 4: Min assist;From bed Stand to Sit: 4: Min assist;To chair/3-in-1;With armrests Details for Transfer Assistance: (A) to achieve upright position and lower to chair; cues for hand placement and sequencing  Ambulation/Gait Ambulation/Gait Assistance: 4: Min assist Ambulation Distance (Feet): 8 Feet Assistive device: Rolling walker Ambulation/Gait Assistance Details: mod-max cues for sequencing and encouragement; (A) to manage RW and (A) for safety; pt with fwd flex posture and eyes closed during amb but was conversing; encouraged to look up where she is walking Gait Pattern: Decreased stance time - right;Decreased step length - right;Trunk flexed Gait velocity: very decreased due to pain Stairs: No Wheelchair Mobility Wheelchair Mobility: No    Exercises Total Joint Exercises Ankle Circles/Pumps: AROM;Both;10 reps;Supine Gluteal Sets: 10 reps;Seated Heel Slides: AROM;5 reps;Left;Supine (reported exercise reduced pain/twitch in Lt LE) Long Arc Quad: AROM;Left;5 reps (limited by pain)   PT Diagnosis: Difficulty walking;Acute pain  PT Problem List: Decreased strength;Decreased range of motion;Decreased balance;Decreased activity tolerance;Decreased mobility;Decreased knowledge of use of DME;Pain PT Treatment Interventions: DME instruction;Gait training;Stair training;Functional mobility training;Therapeutic activities;Therapeutic exercise;Balance training;Neuromuscular re-education;Patient/family education     PT Goals(Current goals can be found in the care plan section) Acute Rehab PT Goals Patient Stated Goal: to get moving PT Goal Formulation: With patient Time For Goal Achievement: 08/17/13 Potential to Achieve Goals: Good  Visit Information  Last PT Received On: 08/10/13 Assistance Needed: +  1 History of Present Illness: s/p elective Lt THA        Prior Functioning  Home Living Family/patient expects to be discharged to:: Private residence Living Arrangements: Spouse/significant other Available Help at Discharge: Family;Available PRN/intermittently Type of Home: House Home Access: Stairs to enter Entrance Stairs-Number of Steps: 12 Entrance Stairs-Rails:  (going up) Home Layout: Two level;Able to live on main level with bedroom/bathroom Home Equipment: Dan Humphreys - 2 wheels;Cane - single point;Bedside commode Prior Function Level of Independence: Independent Comments: pt had gotten back to being Independent after rt THA in march Communication Communication: No difficulties Dominant Hand: Right    Cognition  Cognition Arousal/Alertness: Awake/alert Behavior During Therapy: WFL for tasks assessed/performed Overall Cognitive Status: Within Functional Limits for tasks assessed    Extremity/Trunk Assessment Upper Extremity Assessment Upper Extremity Assessment: Defer to OT evaluation Lower Extremity Assessment Lower Extremity Assessment: LLE deficits/detail LLE: Unable to fully assess due to pain LLE Sensation:  (WFL to light touch ) Cervical / Trunk Assessment Cervical / Trunk Assessment: Normal   Balance Balance Balance Assessed: Yes Static Sitting Balance Static Sitting - Balance Support: Bilateral upper extremity supported;Feet supported Static Sitting - Level of Assistance: 5: Stand by assistance Static Sitting - Comment/# of Minutes: pt tolerated sitting EOB ~6 min for deep breathing exercises to help resolve "lightheadedness"; BP taken see vitals   End of Session PT - End of Session Equipment Utilized During Treatment: Gait belt Activity Tolerance: Patient limited by fatigue Patient left: in chair;with call bell/phone within reach Nurse Communication: Mobility status  GP     Donell Sievert, Talent [phone] 08/10/2013, 9:46 AM

## 2013-08-10 NOTE — Progress Notes (Signed)
Physical Therapy Treatment Patient Details Name: Melissa Grimes MRN: [id number] DOB: Mar 03, 1956 Today's Date: 08/10/2013 Time: 4098-1191 PT Time Calculation (min): 31 min  PT Assessment / Plan / Recommendation  History of Present Illness s/p elective Lt THA   PT Comments   Pt c/o "electrical pain" in posterior aspect of Lt LE. Pt describes pain as radiating from LB to Lt posterior portion of knee. Pt reports she had this pain prior to surgery and is concerned about sciatica. Pt reports pain is better with amb. Pt slowly progressing but requires incr time due to pain and anxiety.    Follow Up Recommendations  Home health PT;Supervision/Assistance - 24 hour     Does the patient have the potential to tolerate intense rehabilitation     Barriers to Discharge        Equipment Recommendations  None recommended by PT    Recommendations for Other Services    Frequency 7X/week   Progress towards PT Goals Progress towards PT goals: Progressing toward goals  Plan Current plan remains appropriate    Precautions / Restrictions Precautions Precautions: Fall Restrictions Weight Bearing Restrictions: Yes LLE Weight Bearing: Weight bearing as tolerated   Pertinent Vitals/Pain 9/10 primarily "elecrtical pain" in posterior portion of Lt LE     Mobility  Bed Mobility Bed Mobility: Sit to Supine Sit to Supine: 4: Min assist Details for Bed Mobility Assistance: (A) to bring Lt LE onto bed; requires incr time due to pain and cues for sequencing; pt unable to (A) Lt LE with UEs at this time  Transfers Transfers: Sit to Stand;Stand to Sit Sit to Stand: 4: Min guard;From chair/3-in-1;With armrests Stand to Sit: 4: Min guard;To bed Details for Transfer Assistance: cues for hand placement and sequencing with RW: pt requires incr time due to pain and anxiety  Ambulation/Gait Ambulation/Gait Assistance: 4: Min guard Ambulation Distance (Feet): 18 Feet Assistive device: Rolling  walker Ambulation/Gait Assistance Details: cues for gt sequencing and to incr foot flat; initially pt required facilitation to advance Lt LE, began to independently advance Lt LE with incr amb  Gait Pattern: Decreased stance time - right;Decreased step length - right;Trunk flexed Gait velocity: very decreased due to pain Stairs: No Wheelchair Mobility Wheelchair Mobility: No    Exercises Total Joint Exercises Ankle Circles/Pumps: AROM;10 reps;Seated Quad Sets: AROM;Left;Seated;5 reps (pt with difficulty and c/o pain ) Gluteal Sets: Both;10 reps;Supine Heel Slides: AAROM;Left;5 reps (pt uses bil UE to perform heel slides ) Hip ABduction/ADduction: AAROM;Left;10 reps Long Arc Quad: AAROM;10 reps;Left (requires incr time due to anxiety and pain)   PT Diagnosis:    PT Problem List:   PT Treatment Interventions:     PT Goals (current goals can now be found in the care plan section) Acute Rehab PT Goals Patient Stated Goal: for this pain to go away  PT Goal Formulation: With patient Time For Goal Achievement: 08/17/13 Potential to Achieve Goals: Good  Visit Information  Last PT Received On: 08/10/13 Assistance Needed: +1 History of Present Illness: s/p elective Lt THA    Subjective Data  Subjective: Pt sitting in chair; "i just cant get comfortable. I keep having this electrical pain in my left leg. I think its sciatica. i had this pain before surgery" Patient Stated Goal: for this pain to go away    Cognition  Cognition Arousal/Alertness: Awake/alert Behavior During Therapy: Anxious Overall Cognitive Status: Within Functional Limits for tasks assessed    Balance  Balance Balance Assessed: No  End of Session  PT - End of Session Equipment Utilized During Treatment: Gait belt Activity Tolerance: Patient limited by pain Patient left: in bed;with call bell/phone within reach Nurse Communication: Mobility status   GP     Donell Sievert, [place] [phone] 08/10/2013, 2:51  PM

## 2013-08-10 NOTE — Evaluation (Signed)
Occupational Therapy Evaluation Patient Details Name: Melissa Grimes MRN: [id number] DOB: Apr 03, 1956 Today's Date: 08/10/2013 Time: 4098-1191 OT Time Calculation (min): 38 min  OT Assessment / Plan / Recommendation History of present illness s/p elective Lt THA   Clinical Impression   Pt is s/p Lt anterior THA resulting in the deficits listed below (see OT Problem List).  Pt will benefit from skilled OT to increase their self care independence and safety with functional mobility to allow discharge to the venue listed below. Pt very fatigued at end of session but was conversing. Pt will need to be mod I prior to D/C; will have husband with her but not during the day due to him having to work.      OT Assessment  Patient needs continued OT Services    Follow Up Recommendations  No OT follow up    Barriers to Discharge Decreased caregiver support Husband works days  Frequency  Min 2X/week    Precautions / Restrictions Precautions Precautions: Fall Precaution Comments: No hip precautions- anterior hip; pt reports she "passed out last time she had surgery due to low BP"  Restrictions Weight Bearing Restrictions: Yes LLE Weight Bearing: Weight bearing as tolerated   Pertinent Vitals/Pain Pre-medicated, not rated yet reports that LLE feels so much better than earlier this afternoon.     ADL  Overall set up for UB and Mod for LB B/Dsg, min-supervision for toilet transfers.   OT Diagnosis: Generalized weakness;Acute pain  OT Problem List: Decreased activity tolerance;Impaired balance (sitting and/or standing);Decreased knowledge of use of DME or AE;Pain OT Treatment Interventions: Self-care/ADL training;Energy conservation;DME and/or AE instruction;Therapeutic activities;Patient/family education   OT Goals(Current goals can be found in the care plan section) Acute Rehab OT Goals Patient Stated Goal: for this pain to go away   Visit Information  Last OT Received On:  08/10/13 Assistance Needed: +1 History of Present Illness: s/p elective Lt THA       Prior Functioning     Home Living Family/patient expects to be discharged to:: Private residence Living Arrangements: Spouse/significant other Available Help at Discharge: Family;Available PRN/intermittently Type of Home: House Home Access: Stairs to enter Entrance Stairs-Number of Steps: 12 Entrance Stairs-Rails:  (going up) Home Layout: Two level;Able to live on main level with bedroom/bathroom Home Equipment: Dan Humphreys - 2 wheels;Cane - single point;Bedside commode Prior Function Level of Independence: Independent Comments: pt had gotten back to being Independent after rt THA in march Communication Communication: No difficulties Dominant Hand: Right    Cognition  Cognition Arousal/Alertness: Awake/alert Behavior During Therapy: WFL for tasks assessed/performed Overall Cognitive Status: Within Functional Limits for tasks assessed    Extremity/Trunk Assessment Upper Extremity Assessment Upper Extremity Assessment: Overall WFL for tasks assessed Lower Extremity Assessment Lower Extremity Assessment: Defer to PT evaluation LLE Sensation:  (WFL to light touch ) Cervical / Trunk Assessment Cervical / Trunk Assessment: Normal     Mobility Bed Mobility Bed Mobility: Sit to Supine Supine to Sit: 5: Supervision;HOB flat Sitting - Scoot to Edge of Bed: 5: Supervision;With rail Sit to Supine: 5: Set up Details for Bed Mobility Assistance: declined bed flat at this time Transfers Sit to Stand: 5: Supervision;From toilet (grab bar) Stand to Sit: 5: Supervision;To toilet;To chair/3-in-1;With armrests Details for Transfer Assistance: pt requires incr time due to pain and anxiety      End of Session OT - End of Session Equipment Utilized During Treatment: Rolling walker Activity Tolerance: Patient limited by fatigue;Patient limited by lethargy Patient left: in  chair;with call bell/phone within  reach  GO     Iva Posten 08/10/2013, 5:12 PM

## 2013-08-11 ENCOUNTER — Encounter (HOSPITAL_COMMUNITY): Payer: Self-pay

## 2013-08-11 LAB — CBC
HCT: 33 % — ABNORMAL LOW (ref 36.0–46.0)
Hemoglobin: 11.1 g/dL — ABNORMAL LOW (ref 12.0–15.0)
MCH: 32 pg (ref 26.0–34.0)
MCHC: 33.6 g/dL (ref 30.0–36.0)
MCV: 95.1 fL (ref 78.0–100.0)
Platelets: 241 10*3/uL (ref 150–400)
RBC: 3.47 MIL/uL — ABNORMAL LOW (ref 3.87–5.11)
RDW: 12.5 % (ref 11.5–15.5)
WBC: 6.9 10*3/uL (ref 4.0–10.5)

## 2013-08-11 MED ORDER — CELECOXIB 200 MG PO CAPS: 200.0000 mg | ORAL_CAPSULE | Freq: Every day | ORAL | Status: DC

## 2013-08-11 MED ORDER — METHOCARBAMOL 500 MG PO TABS: 500.0000 mg | ORAL_TABLET | Freq: Four times a day (QID) | ORAL | Status: DC | PRN

## 2013-08-11 MED ORDER — ASPIRIN 325 MG PO TBEC: 325.0000 mg | DELAYED_RELEASE_TABLET | Freq: Every day | ORAL | Status: DC

## 2013-08-11 MED ORDER — GABAPENTIN 300 MG PO CAPS: 300.0000 mg | ORAL_CAPSULE | Freq: Every day | ORAL | Status: DC

## 2013-08-11 MED ORDER — OXYCODONE-ACETAMINOPHEN 5-325 MG PO TABS: 1.0000 | ORAL_TABLET | Freq: Every evening | ORAL | Status: DC | PRN

## 2013-08-11 NOTE — Progress Notes (Signed)
OT Cancellation Note  Patient Details Name: Melissa Grimes MRN: [id number] DOB: May 16, 1956   Cancelled Treatment:    Reason Eval/Treat Not Completed: Fatigue/lethargy limiting ability to participate. Will re-attempt next date.  08/11/2013 Cipriano Mile OTR/L Pager [phone] Office [phone]

## 2013-08-11 NOTE — Progress Notes (Signed)
Subjective: 2 Days Post-Op Procedure(s) (LRB): LEFT TOTAL HIP ARTHROPLASTY ANTERIOR APPROACH (Left) Patient reports pain as mild.  States celebrex and neurontin have helped with radicular pain down leg.  Objective: Vital signs in last 24 hours: Temp:  [97.9 F (36.6 C)-98.6 F (37 C)] 97.9 F (36.6 C) (09/25 1347) Pulse Rate:  [77-89] 89 (09/25 1347) Resp:  [16] 16 (09/25 1347) BP: (110-133)/(66-84) 133/79 mmHg (09/25 1347) SpO2:  [95 %-100 %] 100 % (09/25 1347) Weight:  [70.761 kg (156 lb)] 70.761 kg (156 lb) (09/25 0300)  Intake/Output from previous day: 09/24 0701 - 09/25 0700 In: 1650 [P.O.:480; I.V.:1170] Out: 800 [Urine:800] Intake/Output this shift: Total I/O In: 840 [P.O.:840] Out: -    Recent Labs  08/10/13 0840 08/11/13 0955  HGB 11.8* 11.1*    Recent Labs  08/10/13 0840 08/11/13 0955  WBC 9.3 6.9  RBC 3.66* 3.47*  HCT 34.9* 33.0*  PLT 267 241    Recent Labs  08/10/13 0840  NA 136  K 3.4*  CL 103  CO2 26  BUN 8  CREATININE 0.62  GLUCOSE 111*  CALCIUM 8.3*   No results found for this basename: LABPT, INR,  in the last 72 hours  Neurovascular intact Dorsiflexion/Plantar flexion intact Compartment soft  Assessment/Plan: 2 Days Post-Op Procedure(s) (LRB): LEFT TOTAL HIP ARTHROPLASTY ANTERIOR APPROACH (Left) Plan for discharge tomorrow Discharge home with home health  Richardean Canal 08/11/2013, 5:14 PM

## 2013-08-11 NOTE — Plan of Care (Signed)
Problem: Consults Goal: Diagnosis- Total Joint Replacement Primary Total Hip     

## 2013-08-11 NOTE — Progress Notes (Signed)
08/11/13 Spoke with patient about HHC. She selected [facility] from [facility] agencies list.Contacted Rajanae Mantia at [facility] and set up HHPT. Patient has a rolling walker and 3N1 from previous surgery, no eqipment needs identified. Jacquelynn Cree RN, BSN, CCM

## 2013-08-11 NOTE — Progress Notes (Signed)
Physical Therapy Treatment Patient Details Name: Melissa Grimes MRN: [id number] DOB: Jul 26, 1956 Today's Date: 08/11/2013 Time: 7829-5621 PT Time Calculation (min): 25 min  PT Assessment / Plan / Recommendation  History of Present Illness s/p elective Lt THA   PT Comments   Pt progressing well with therapy; requires incr time due to pain and anxiety. Pt plans to D/C tomorrow. Patient needs to practice stairs next session prior to D/C.      Follow Up Recommendations  Home health PT;Supervision/Assistance - 24 hour     Does the patient have the potential to tolerate intense rehabilitation     Barriers to Discharge        Equipment Recommendations  None recommended by PT    Recommendations for Other Services OT consult  Frequency 7X/week   Progress towards PT Goals Progress towards PT goals: Progressing toward goals  Plan Current plan remains appropriate    Precautions / Restrictions Precautions Precautions: Fall Restrictions Weight Bearing Restrictions: Yes LLE Weight Bearing: Weight bearing as tolerated   Pertinent Vitals/Pain 3/10; patient repositioned for comfort     Mobility  Bed Mobility Bed Mobility: Sit to Supine Sit to Supine: 5: Set up;With rail Details for Bed Mobility Assistance: pt uses bil UEs to bring bil LEs onto bed; no physical (A) needed; cues for safety  Transfers Transfers: Sit to Stand;Stand to Sit Sit to Stand: 4: Min guard;From chair/3-in-1;With armrests Stand to Sit: 4: Min guard;To bed Details for Transfer Assistance: min guard to steady and for safety; incr time due to anxiety; cues for hand placement and safety  Ambulation/Gait Ambulation/Gait Assistance: 5: Supervision Ambulation Distance (Feet): 100 Feet Assistive device: Rolling walker Ambulation/Gait Assistance Details: cues for gt sequencing and upright posture; pt has difficulty intially advancing Lt LE and uses UE to advance Lt LE until she becomes warmed up and then can do so  independently; cont fwd flex posture; requires cues for upright posture and proper heel strike  Gait Pattern: Decreased stance time - right;Decreased step length - right;Trunk flexed Gait velocity: decreased due to pain Stairs: No Wheelchair Mobility Wheelchair Mobility: No    Exercises Total Joint Exercises Ankle Circles/Pumps: AROM;10 reps;Seated Quad Sets: AROM;Left;10 reps;Supine Gluteal Sets: 10 reps;Seated Hip ABduction/ADduction: AAROM;Left;10 reps;Other (comment) Long Arc Quad: Left;10 reps;AAROM;Seated;Other (comment) Knee Flexion: AROM;5 reps;Standing   PT Diagnosis:    PT Problem List:   PT Treatment Interventions:     PT Goals (current goals can now be found in the care plan section) Acute Rehab PT Goals Patient Stated Goal: to practice steps and go home tomorrow by midnight PT Goal Formulation: With patient Time For Goal Achievement: 08/17/13 Potential to Achieve Goals: Good  Visit Information  Last PT Received On: 08/11/13 Assistance Needed: +1 History of Present Illness: s/p elective Lt THA    Subjective Data  Subjective: pt sitting in chair "im so glad you are here, i am hurting and ready to get back in the bed"  Patient Stated Goal: to practice steps and go home tomorrow by midnight   Cognition  Cognition Arousal/Alertness: Awake/alert Behavior During Therapy: Anxious Overall Cognitive Status: Within Functional Limits for tasks assessed    Balance  Balance Balance Assessed: No  End of Session PT - End of Session Equipment Utilized During Treatment: Gait belt Activity Tolerance: Patient tolerated treatment well Patient left: in bed;with call bell/phone within reach Nurse Communication: Mobility status   GP     Donell Sievert, [place] [phone] 08/11/2013, 2:26 PM

## 2013-08-11 NOTE — Progress Notes (Signed)
Physical Therapy Treatment Patient Details Name: Melissa Grimes MRN: [id number] DOB: 1956-01-03 Today's Date: 08/11/2013 Time: 4098-1191 PT Time Calculation (min): 25 min  PT Assessment / Plan / Recommendation  History of Present Illness s/p elective Lt THA   PT Comments   Pt progressing in therapy. Today she states her "sciatica is more controlled if she doesn't sit in the chair long". C/o "gas pains". Pt motivated to work with therapy. Requires incr time with activities due to pain and anxiety. Will cont per POC.   Follow Up Recommendations  Home health PT;Supervision/Assistance - 24 hour     Does the patient have the potential to tolerate intense rehabilitation     Barriers to Discharge        Equipment Recommendations  None recommended by PT    Recommendations for Other Services OT consult  Frequency 7X/week   Progress towards PT Goals Progress towards PT goals: Progressing toward goals  Plan Current plan remains appropriate    Precautions / Restrictions Precautions Precautions: Fall Restrictions Weight Bearing Restrictions: Yes LLE Weight Bearing: Weight bearing as tolerated   Pertinent Vitals/Pain 3/10 "burning pain" in incision; 6/10 "gas pains" Pt premedicated.     Mobility  Bed Mobility Bed Mobility: Sit to Supine Sit to Supine: 5: Set up;With rail Details for Bed Mobility Assistance: pt able to use UEs to advance Lt LE onto bed; no physical (A) needed  Transfers Transfers: Sit to Stand;Stand to Sit Sit to Stand: 4: Min guard;From chair/3-in-1;With armrests Stand to Sit: 4: Min guard;To bed Details for Transfer Assistance: min guard to steady and for safety; pt requires incr time due to pain and anxiety  Ambulation/Gait Ambulation/Gait Assistance: 4: Min guard Ambulation Distance (Feet): 80 Feet Assistive device: Rolling walker Ambulation/Gait Assistance Details: cues for gt sequencing and upright posture; pt slowly progressing to step through gt; limited  heel strike due to pain Gait Pattern: Decreased stance time - right;Decreased step length - right;Trunk flexed Gait velocity: decreased due to pain Stairs: No Wheelchair Mobility Wheelchair Mobility: No    Exercises Total Joint Exercises Ankle Circles/Pumps: AROM;10 reps;Seated Quad Sets: AROM;Left;10 reps;Seated Hip ABduction/ADduction: AAROM;Left;10 reps;Other (comment) (with incr time due to pain) Long Arc Quad: Left;10 reps;AAROM;Seated;Other (comment) (required (A) today due to pain)   PT Diagnosis:    PT Problem List:   PT Treatment Interventions:     PT Goals (current goals can now be found in the care plan section) Acute Rehab PT Goals Patient Stated Goal: to go home tomorrow  PT Goal Formulation: With patient Time For Goal Achievement: 08/17/13 Potential to Achieve Goals: Good  Visit Information  Last PT Received On: 08/11/13 Assistance Needed: +1 History of Present Illness: s/p elective Lt THA    Subjective Data  Subjective: Pt sitting in chair; "Im ready to get back in bed. if i sit here my sciatica starts hurting"  Patient Stated Goal: to go home tomorrow    Cognition  Cognition Arousal/Alertness: Awake/alert Behavior During Therapy: Anxious Overall Cognitive Status: Within Functional Limits for tasks assessed    Balance  Balance Balance Assessed: No  End of Session PT - End of Session Equipment Utilized During Treatment: Gait belt Activity Tolerance: Patient tolerated treatment well Patient left: in bed;with call bell/phone within reach Nurse Communication: Mobility status   GP     Donell Sievert, [place] [phone] 08/11/2013, 9:10 AM

## 2013-08-12 LAB — CBC
HCT: 33.7 % — ABNORMAL LOW (ref 36.0–46.0)
Hemoglobin: 11.3 g/dL — ABNORMAL LOW (ref 12.0–15.0)
MCH: 32.2 pg (ref 26.0–34.0)
MCHC: 33.5 g/dL (ref 30.0–36.0)
MCV: 96 fL (ref 78.0–100.0)
Platelets: 258 10*3/uL (ref 150–400)
RBC: 3.51 MIL/uL — ABNORMAL LOW (ref 3.87–5.11)
RDW: 12.7 % (ref 11.5–15.5)
WBC: 7.4 10*3/uL (ref 4.0–10.5)

## 2013-08-12 MED ORDER — PNEUMOCOCCAL VAC POLYVALENT 25 MCG/0.5ML IJ INJ
0.5000 mL | INJECTION | INTRAMUSCULAR | Status: AC
  Administered 2013-08-12: 0.5 mL via INTRAMUSCULAR
  Filled 2013-08-12: qty 0.5

## 2013-08-12 NOTE — Discharge Summary (Signed)
Patient ID: Melissa Grimes MRN: [id number] DOB/AGE: 1955-12-15 57 y.o.  Admit date: 08/09/2013 Discharge date: 08/12/2013  Admission Diagnoses:  Principal Problem:   Arthritis pain of left hip   Discharge Diagnoses:  Same  Past Medical History  Diagnosis Date  . Arthritis   . Hyperlipidemia   . Hypertension   . Complication of anesthesia     difficulty urinating after    Surgeries: Procedure(s): LEFT TOTAL HIP ARTHROPLASTY ANTERIOR APPROACH on 08/09/2013   Consultants:    Discharged Condition: Improved  Hospital Course: Melissa Grimes is an 58 y.o. female who was admitted 08/09/2013 for operative treatment ofArthritis pain of hip. Patient has severe unremitting pain that affects sleep, daily activities, and work/hobbies. After pre-op clearance the patient was taken to the operating room on 08/09/2013 and underwent  Procedure(s): LEFT TOTAL HIP ARTHROPLASTY ANTERIOR APPROACH.    Patient was given perioperative antibiotics: Anti-infectives   Start     Dose/Rate Route Frequency Ordered Stop   08/09/13 1715  ceFAZolin (ANCEF) IVPB 1 g/50 mL premix     1 g 100 mL/hr over 30 Minutes Intravenous Every 6 hours 08/09/13 1712 08/09/13 2245   08/09/13 0600  ceFAZolin (ANCEF) IVPB 2 g/50 mL premix     2 g 100 mL/hr over 30 Minutes Intravenous On call to O.R. 08/08/13 1423 08/09/13 1241       Patient was given sequential compression devices, early ambulation, and chemoprophylaxis to prevent DVT.  Patient benefited maximally from hospital stay and there were no complications.    Recent vital signs: Patient Vitals for the past 24 hrs:  BP Temp Temp src Pulse Resp SpO2  08/12/13 0504 107/64 mmHg 98 F (36.7 C) - 79 16 96 %  08/11/13 2028 119/71 mmHg 99.6 F (37.6 C) - 89 16 97 %  08/11/13 1347 133/79 mmHg 97.9 F (36.6 C) - 89 16 100 %  08/11/13 1220 - 98.1 F (36.7 C) Oral - - -  08/11/13 1007 110/84 mmHg - - - - -     Recent laboratory studies:  Recent Labs   08/10/13 0840 08/11/13 0955  WBC 9.3 6.9  HGB 11.8* 11.1*  HCT 34.9* 33.0*  PLT 267 241  NA 136  --   K 3.4*  --   CL 103  --   CO2 26  --   BUN 8  --   CREATININE 0.62  --   GLUCOSE 111*  --   CALCIUM 8.3*  --      Discharge Medications:     Medication List    STOP taking these medications       HYDROcodone-acetaminophen 5-325 MG per tablet  Commonly known as:  NORCO/VICODIN     TYLENOL EX ST ARTHRITIS PAIN 500 MG tablet  Generic drug:  acetaminophen      TAKE these medications       Acidophilus 100 MG Caps  Take 100 mg by mouth daily.     aspirin 325 MG EC tablet  Take 1 tablet (325 mg total) by mouth daily with breakfast.     calcium citrate-vitamin D 500-400 MG-UNIT chewable tablet  Chew 2 tablets by mouth 2 (two) times daily.     celecoxib 200 MG capsule  Commonly known as:  CELEBREX  Take 1 capsule (200 mg total) by mouth daily.     estradiol-norethindrone 1-0.5 MG per tablet  Commonly known as:  MIMVEY  Take 1 tablet by mouth daily.     Fish Oil 1200 MG Caps  Take 1 capsule by mouth daily.     gabapentin 300 MG capsule  Commonly known as:  NEURONTIN  Take 1 capsule (300 mg total) by mouth at bedtime.     lisinopril 20 MG tablet  Commonly known as:  PRINIVIL,ZESTRIL  TAKE 1 TABLET (20 MG TOTAL) BY MOUTH DAILY.     methocarbamol 500 MG tablet  Commonly known as:  ROBAXIN  Take 1 tablet (500 mg total) by mouth every 6 (six) hours as needed.     multivitamin with minerals Tabs tablet  Take 1 tablet by mouth daily. Centrum Silver     oxyCODONE-acetaminophen 5-325 MG per tablet  Commonly known as:  PERCOCET/ROXICET  Take 1-2 tablets by mouth at bedtime as needed for pain.     pravastatin 20 MG tablet  Commonly known as:  PRAVACHOL  Take 1 tablet (20 mg total) by mouth daily.        Diagnostic Studies: Dg Hip Operative Left  08/09/2013   *RADIOLOGY REPORT*  Clinical Data: Osteoarthritis  OPERATIVE LEFT HIP  Technique:  C-arm fluoroscopic  images were obtained intraoperatively and submitted for postoperative interpretation. Please see the performing provider's procedural report for the fluoroscopy time utilized.  Comparison: None.  Findings: C-arm radiographs demonstrate satisfactory position alignment status post left T H R.  IMPRESSION: As above   Original Report Authenticated By: Davonna Belling, M.D.   Dg Pelvis Portable  08/09/2013   CLINICAL DATA:  Postop left hip replacement  EXAM: PORTABLE PELVIS  COMPARISON:  Portable pelvis of 01/18/2013  FINDINGS: A left total hip replacement is now present. The only questionable abnormality is a faint linear lucency overlying the medullary portion of the mid proximal left femoral shaft. This most likely is normal but attention to this area on follow-up plain films is recommended. Right total hip replacement also is noted.  IMPRESSION: Left total hip replacement. Faint lucency in mid proximal left femoral shaft of questionable significance. Recommend followup.   Electronically Signed   By: Dwyane Dee M.D.   On: 08/09/2013 16:44   Dg Hip Portable 1 View Left  08/09/2013   CLINICAL DATA:  Hip surgery  EXAM: PORTABLE LEFT HIP - 1 VIEW  COMPARISON:  08/09/2013  FINDINGS: Image demonstrates a total hip arthroplasty.  IMPRESSION: Total hip arthroplasty.   Electronically Signed   By: Maryclare Bean M.D.   On: 08/09/2013 17:14    Disposition: 06-Home-Health Care Svc      Discharge Orders   Future Appointments Provider Department Dept Phone   01/10/2014 10:30 AM Sandford Craze, NP [facility] [phone]   Future Orders Complete By Expires   Call MD / Call 911  As directed    Comments:     If you experience chest pain or shortness of breath, CALL 911 and be transported to the hospital emergency room.  If you develope a fever above 101 F, pus (white drainage) or increased drainage or redness at the wound, or calf pain, call your surgeon's office.   Constipation Prevention  As  directed    Comments:     Drink plenty of fluids.  Prune juice may be helpful.  You may use a stool softener, such as Colace (over the counter) 100 mg twice a day.  Use MiraLax (over the counter) for constipation as needed.   Diet - low sodium heart healthy  As directed    Discharge patient  As directed    Discharge wound care:  As directed  Comments:     May shower with dressing intact. Keep dressing clean and intact until Wednesday then remove dressing and shower. Apply clean dressing after showering   Increase activity slowly as tolerated  As directed    Weight bearing as tolerated  As directed       Follow-up Information   Follow up with [facility].   Contact information:   [address] [place] [address] [phone]       Follow up with Kathryne Hitch, MD In 2 weeks.   Specialty:  Orthopedic Surgery   Contact information:   [address] [place] [address] [phone]        Signed: Kathryne Hitch 08/12/2013, 7:08 AM

## 2013-08-12 NOTE — Progress Notes (Signed)
Patient ID: Melissa Grimes, female   DOB: 1956/03/26, 57 y.o.   MRN: [id number] Looks great.  Can discharge to home today.

## 2013-08-12 NOTE — Progress Notes (Signed)
RN reviewed discharge instructions with patient and husband. All questions answered. Prescriptions given to patient and family. Patient discharged with volunteer services to family car.

## 2013-08-12 NOTE — Progress Notes (Signed)
Physical Therapy Treatment Patient Details Name: Melissa Grimes MRN: [id number] DOB: [birth year] Today's Date: 08/12/2013 Time: 4098-1191 PT Time Calculation (min): 25 min  PT Assessment / Plan / Recommendation  History of Present Illness s/p elective Lt THA   PT Comments   Pt making progress with therapy. Anticipate D/C this afternoon. Will benefit from additional session for family education and further practice on stair amb to ensure safe transition home with husband.   Follow Up Recommendations  Home health PT;Supervision/Assistance - 24 hour     Does the patient have the potential to tolerate intense rehabilitation     Barriers to Discharge        Equipment Recommendations  None recommended by PT    Recommendations for Other Services OT consult  Frequency 7X/week   Progress towards PT Goals Progress towards PT goals: Progressing toward goals  Plan Current plan remains appropriate    Precautions / Restrictions Precautions Precautions: Fall Precaution Comments: No hip precautions- anterior hip; pt reports she "passed out last time she had surgery due to low BP"  Restrictions Weight Bearing Restrictions: Yes LLE Weight Bearing: Weight bearing as tolerated   Pertinent Vitals/Pain 4/10; patient repositioned for comfort     Mobility  Bed Mobility Bed Mobility: Supine to Sit Supine to Sit: 5: Supervision;HOB flat Details for Bed Mobility Assistance: HOB flattened to simulate home enviroment; pt requires incr time and supervision for safety  Transfers Transfers: Sit to Stand;Stand to Sit Sit to Stand: 5: Supervision;From bed Stand to Sit: 5: Supervision;To chair/3-in-1 Details for Transfer Assistance: supervision for safety; no physical (A) needed; cues for hand placement  Ambulation/Gait Ambulation/Gait Assistance: 5: Supervision Ambulation Distance (Feet): 180 Feet Assistive device: Rolling walker Ambulation/Gait Assistance Details: cues for gt sequencing and safety  with RW; pt with difficulty progressing to step through gt initially; was able to at end of session; cues for upright posture  Gait Pattern: Decreased stance time - right;Decreased step length - right;Trunk flexed Gait velocity: decreased due to pain Stairs: Yes Stairs Assistance: 5: Supervision Stairs Assistance Details (indicate cue type and reason): cues for gt sequencing and safety Stair Management Technique: One rail Left;Step to pattern;Forwards Number of Stairs: 3 (x2) Wheelchair Mobility Wheelchair Mobility: No    Exercises Total Joint Exercises Ankle Circles/Pumps: AROM;10 reps;Both Quad Sets: AROM;Left;10 reps;Seated Gluteal Sets: 10 reps;Seated Short Arc Quad: AROM;Left;5 reps;Seated Heel Slides: Left;10 reps;Seated;AAROM Hip ABduction/ADduction: AAROM;Left;10 reps;Other (comment) Long Arc Quad: Left;10 reps;AAROM;Seated;Other (comment)   PT Diagnosis:    PT Problem List:   PT Treatment Interventions:     PT Goals (current goals can now be found in the care plan section) Acute Rehab PT Goals Patient Stated Goal: to go home later today PT Goal Formulation: With patient Time For Goal Achievement: 08/17/13 Potential to Achieve Goals: Good  Visit Information  Last PT Received On: 08/12/13 Assistance Needed: +1 History of Present Illness: s/p elective Lt THA    Subjective Data  Subjective: Pt lying supine; 'you ready to practice these steps?" Patient Stated Goal: to go home later today   Cognition  Cognition Arousal/Alertness: Awake/alert Behavior During Therapy: Anxious Overall Cognitive Status: Within Functional Limits for tasks assessed    Balance  Balance Balance Assessed: No  End of Session PT - End of Session Equipment Utilized During Treatment: Gait belt Activity Tolerance: Patient tolerated treatment well Patient left: in chair;with call bell/phone within reach Nurse Communication: Mobility status   GP     Donell Sievert,  [place] [phone] 08/12/2013,  9:16 AM

## 2013-08-12 NOTE — Progress Notes (Signed)
Occupational Therapy Treatment Patient Details Name: Melissa Grimes MRN: [id number] DOB: 06/14/1956 Today's Date: 08/12/2013 Time: 4098-1191 OT Time Calculation (min): 27 min  OT Assessment / Plan / Recommendation  History of present illness s/p elective Lt THA   OT comments  Patient has made great progress in short period and reports she is very pleased with her progress and how well this surgery went compared to Rt THA earlier this year.  "Maybe I will be able to get on a motorcycle again!"  Patient up in recliner upon arrival having already bathed and dressed with set up from NT.  Ambulated to bathroom for toilet transfer and toileting them wanted to go for a walk to work out stiffness.  Patient overall Mod I with all mobility during this session.  Patient states she can get into and out of bed without assistance even though we did not attempt this session.  OT signing off.   Follow Up Recommendations  No OT follow up    Progress towards OT Goals Progress towards OT goals: Goals met/education completed, patient discharged from OT  Precautions / Restrictions Precautions Precautions: Fall Precaution Comments: No hip precautions- anterior hip; pt reports she "passed out last time she had surgery due to low BP"  Restrictions Weight Bearing Restrictions: Yes LLE Weight Bearing: Weight bearing as tolerated   Pertinent Vitals/Pain Denies pain    ADL  Toilet Transfer: Performed;Modified independent Toilet Transfer Method: Sit to stand;Stand pivot Toilet Transfer Equipment: Raised toilet seat with arms (or 3-in-1 over toilet) Toileting - Clothing Manipulation and Hygiene: Performed;Modified independent Where Assessed - Toileting Clothing Manipulation and Hygiene: Standing;Sit on 3-in-1 or toilet Transfers/Ambulation Related to ADLs: Patient demonsta Mod I with RW to ambulate in her room ADL Comments: Patient had already taken a sponge bath and dressed secondary to planned d/c this  afternoon.  Patient stated that she needed no assist with these tasks.    OT Goals(current goals can now be found in the care plan section) Acute Rehab OT Goals Patient Stated Goal: "I am so pleased with this surgery and how well I have done compared to the other hip"  Visit Information  Last OT Received On: 08/12/13 History of Present Illness: s/p elective Lt THA    Cognition  Cognition Arousal/Alertness: Awake/alert Behavior During Therapy: Anxious Overall Cognitive Status: Within Functional Limits for tasks assessed    Mobility  Bed Mobility Details for Bed Mobility Assistance: Patient states that she can get in and out of bed by herself Transfers Sit to Stand: 6: Modified independent (Device/Increase time);From chair/3-in-1;From toilet Stand to Sit: To chair/3-in-1;6: Modified independent (Device/Increase time);To toilet    End of Session OT - End of Session Equipment Utilized During Treatment: Rolling walker Patient left: in chair;with call bell/phone within reach  GO     Melissa Grimes 08/12/2013, 1:11 PM

## 2013-08-12 NOTE — Progress Notes (Signed)
Physical Therapy Treatment Patient Details Name: Melissa Grimes MRN: [id number] DOB: 01-13-1956 Today's Date: 08/12/2013 Time: 2440-1027 PT Time Calculation (min): 11 min  PT Assessment / Plan / Recommendation  History of Present Illness s/p elective Lt THA   PT Comments   Pt and husband educated on stair amb technique and car transfer technique. Pt and husband demo good technique with HHA provided by husband on steps. Reviewed HEP with pt; pt verbalized understanding. ALl educated completed at this time. Pt is ready from mobility standpoint for D/C.   Follow Up Recommendations  Home health PT;Supervision/Assistance - 24 hour     Does the patient have the potential to tolerate intense rehabilitation     Barriers to Discharge        Equipment Recommendations  None recommended by PT    Recommendations for Other Services    Frequency 7X/week   Progress towards PT Goals Progress towards PT goals: Progressing toward goals  Plan Current plan remains appropriate    Precautions / Restrictions Precautions Precautions: None Precaution Comments: No hip precautions- anterior hip; pt reports she "passed out last time she had surgery due to low BP"  Restrictions Weight Bearing Restrictions: Yes LLE Weight Bearing: Weight bearing as tolerated   Pertinent Vitals/Pain 4/10; pt premedicated     Mobility  Bed Mobility Bed Mobility: Not assessed Details for Bed Mobility Assistance: Patient states that she can get in and out of bed by herself Transfers Transfers: Sit to Stand;Stand to Sit Sit to Stand: 5: Supervision;From chair/3-in-1;From bed Stand to Sit: 5: Supervision;To chair/3-in-1;To bed Details for Transfer Assistance: supervision for safety and cues for hand placement  Ambulation/Gait Ambulation/Gait Assistance: 5: Supervision Ambulation Distance (Feet): 90 Feet Assistive device: Rolling walker Ambulation/Gait Assistance Details: cues for gt sequencing and upright posture   Gait Pattern: Decreased stance time - right;Decreased step length - right;Trunk flexed Gait velocity: decreased due to pain Stairs: Yes Stairs Assistance: 4: Min guard Stairs Assistance Details (indicate cue type and reason): cues for stair amb technique; pt and husband educated on proper handheld (A) technique; with Lt UE supported by hand rail Stair Management Technique: One rail Left;Step to pattern;Forwards;Other (comment) (hand held (A) with Rt UE ) Number of Stairs: 5 (x1) Wheelchair Mobility Wheelchair Mobility: No    Exercises Other Exercises Other Exercises: discussed HEP and pt verbalized understand of frequency and repititions    PT Diagnosis:    PT Problem List:   PT Treatment Interventions:     PT Goals (current goals can now be found in the care plan section) Acute Rehab PT Goals Patient Stated Goal: Im ready to go home with this last session PT Goal Formulation: With patient Time For Goal Achievement: 08/17/13 Potential to Achieve Goals: Good  Visit Information  Last PT Received On: 08/12/13 Assistance Needed: +1 History of Present Illness: s/p elective Lt THA    Subjective Data  Subjective: pt and husband in ortho gym; requesting to attempt steps with husband present ; pt stated "i have been doing my exercises, i feel like im ok with them" Patient Stated Goal: Im ready to go home with this last session   Cognition  Cognition Arousal/Alertness: Awake/alert Behavior During Therapy: WFL for tasks assessed/performed Overall Cognitive Status: Within Functional Limits for tasks assessed    Balance  Balance Balance Assessed: No  End of Session PT - End of Session Activity Tolerance: Patient tolerated treatment well Patient left: in chair;with call bell/phone within reach;with family/visitor present Nurse Communication: Mobility status  GP     Shelva Majestic [place], [place] [phone] 08/12/2013, 3:10 PM

## 2013-08-14 ENCOUNTER — Emergency Department (HOSPITAL_COMMUNITY): Payer: BC Managed Care – PPO

## 2013-08-14 ENCOUNTER — Inpatient Hospital Stay (HOSPITAL_COMMUNITY): Payer: BC Managed Care – PPO

## 2013-08-14 ENCOUNTER — Inpatient Hospital Stay (HOSPITAL_COMMUNITY)
Admission: EM | Admit: 2013-08-14 | Discharge: 2013-08-15 | DRG: 014 | Disposition: A | Discharge status: Home / Self Care | Payer: BC Managed Care – PPO | Attending: Internal Medicine | Admitting: Internal Medicine

## 2013-08-14 ENCOUNTER — Encounter (HOSPITAL_COMMUNITY): Payer: Self-pay | Admitting: Nurse Practitioner

## 2013-08-14 DIAGNOSIS — E785 Hyperlipidemia, unspecified: Secondary | ICD-10-CM | POA: Diagnosis present

## 2013-08-14 DIAGNOSIS — M1990 Unspecified osteoarthritis, unspecified site: Secondary | ICD-10-CM | POA: Diagnosis present

## 2013-08-14 DIAGNOSIS — M1610 Unilateral primary osteoarthritis, unspecified hip: Secondary | ICD-10-CM

## 2013-08-14 DIAGNOSIS — D259 Leiomyoma of uterus, unspecified: Secondary | ICD-10-CM

## 2013-08-14 DIAGNOSIS — R42 Dizziness and giddiness: Secondary | ICD-10-CM | POA: Diagnosis present

## 2013-08-14 DIAGNOSIS — Z7982 Long term (current) use of aspirin: Secondary | ICD-10-CM

## 2013-08-14 DIAGNOSIS — M6281 Muscle weakness (generalized): Secondary | ICD-10-CM | POA: Diagnosis present

## 2013-08-14 DIAGNOSIS — M169 Osteoarthritis of hip, unspecified: Secondary | ICD-10-CM

## 2013-08-14 DIAGNOSIS — Z79899 Other long term (current) drug therapy: Secondary | ICD-10-CM

## 2013-08-14 DIAGNOSIS — I6789 Other cerebrovascular disease: Secondary | ICD-10-CM | POA: Diagnosis present

## 2013-08-14 DIAGNOSIS — H05822 Myopathy of extraocular muscles, left orbit: Secondary | ICD-10-CM

## 2013-08-14 DIAGNOSIS — H05829 Myopathy of extraocular muscles, unspecified orbit: Secondary | ICD-10-CM

## 2013-08-14 DIAGNOSIS — Z0000 Encounter for general adult medical examination without abnormal findings: Secondary | ICD-10-CM

## 2013-08-14 DIAGNOSIS — I639 Cerebral infarction, unspecified: Secondary | ICD-10-CM

## 2013-08-14 DIAGNOSIS — Z96649 Presence of unspecified artificial hip joint: Secondary | ICD-10-CM

## 2013-08-14 DIAGNOSIS — H519 Unspecified disorder of binocular movement: Secondary | ICD-10-CM

## 2013-08-14 DIAGNOSIS — I6350 Cerebral infarction due to unspecified occlusion or stenosis of unspecified cerebral artery: Principal | ICD-10-CM | POA: Diagnosis present

## 2013-08-14 DIAGNOSIS — I10 Essential (primary) hypertension: Secondary | ICD-10-CM

## 2013-08-14 DIAGNOSIS — H532 Diplopia: Secondary | ICD-10-CM | POA: Diagnosis present

## 2013-08-14 DIAGNOSIS — H510 Palsy (spasm) of conjugate gaze: Secondary | ICD-10-CM | POA: Diagnosis present

## 2013-08-14 LAB — COMPREHENSIVE METABOLIC PANEL
ALT: 17 U/L (ref 0–35)
AST: 23 U/L (ref 0–37)
Albumin: 3.2 g/dL — ABNORMAL LOW (ref 3.5–5.2)
Alkaline Phosphatase: 53 U/L (ref 39–117)
BUN: 12 mg/dL (ref 6–23)
CO2: 26 mEq/L (ref 19–32)
Calcium: 8.6 mg/dL (ref 8.4–10.5)
Chloride: 102 mEq/L (ref 96–112)
Creatinine, Ser: 0.53 mg/dL (ref 0.50–1.10)
GFR calc Af Amer: >90 mL/min (ref >90)
GFR calc non Af Amer: >90 mL/min (ref >90)
Glucose, Bld: 95 mg/dL (ref 70–99)
Potassium: 3.7 mEq/L (ref 3.5–5.1)
Sodium: 139 mEq/L (ref 135–145)
Total Bilirubin: 0.2 mg/dL — ABNORMAL LOW (ref 0.3–1.2)
Total Protein: 7 g/dL (ref 6.0–8.3)

## 2013-08-14 LAB — POCT I-STAT, CHEM 8
BUN: 15 mg/dL (ref 6–23)
Calcium, Ion: 1.13 mmol/L (ref 1.12–1.23)
Chloride: 102 mEq/L (ref 96–112)
Creatinine, Ser: 0.7 mg/dL (ref 0.50–1.10)
Glucose, Bld: 96 mg/dL (ref 70–99)
HCT: 38 % (ref 36.0–46.0)
Hemoglobin: 12.9 g/dL (ref 12.0–15.0)
Potassium: 3.9 mEq/L (ref 3.5–5.1)
Sodium: 141 mEq/L (ref 135–145)
TCO2: 29 mmol/L (ref 0–100)

## 2013-08-14 LAB — DIFFERENTIAL
Basophils Absolute: 0.1 10*3/uL (ref 0.0–0.1)
Basophils Relative: 1 % (ref 0–1)
Eosinophils Absolute: 0.3 10*3/uL (ref 0.0–0.7)
Eosinophils Relative: 4 % (ref 0–5)
Lymphocytes Relative: 25 % (ref 12–46)
Lymphs Abs: 1.9 10*3/uL (ref 0.7–4.0)
Monocytes Absolute: 0.5 10*3/uL (ref 0.1–1.0)
Monocytes Relative: 7 % (ref 3–12)
Neutro Abs: 4.9 10*3/uL (ref 1.7–7.7)
Neutrophils Relative %: 64 % (ref 43–77)

## 2013-08-14 LAB — CBC
HCT: 37 % (ref 36.0–46.0)
Hemoglobin: 12.6 g/dL (ref 12.0–15.0)
MCH: 32.2 pg (ref 26.0–34.0)
MCHC: 34.1 g/dL (ref 30.0–36.0)
MCV: 94.6 fL (ref 78.0–100.0)
Platelets: 375 10*3/uL (ref 150–400)
RBC: 3.91 MIL/uL (ref 3.87–5.11)
RDW: 12.4 % (ref 11.5–15.5)
WBC: 7.6 10*3/uL (ref 4.0–10.5)

## 2013-08-14 LAB — ETHANOL: Alcohol, Ethyl (B): <11 mg/dL (ref 0–11)

## 2013-08-14 LAB — URINE MICROSCOPIC-ADD ON

## 2013-08-14 LAB — APTT: aPTT: 27 seconds (ref 24–37)

## 2013-08-14 LAB — PROTIME-INR
INR: 0.93 (ref 0.00–1.49)
Prothrombin Time: 12.3 seconds (ref 11.6–15.2)

## 2013-08-14 LAB — URINALYSIS, ROUTINE W REFLEX MICROSCOPIC
Bilirubin Urine: NEGATIVE
Glucose, UA: NEGATIVE mg/dL
Hgb urine dipstick: NEGATIVE
Ketones, ur: NEGATIVE mg/dL
Nitrite: NEGATIVE
Protein, ur: NEGATIVE mg/dL
Specific Gravity, Urine: 1.018 (ref 1.005–1.030)
Urobilinogen, UA: 0.2 mg/dL (ref 0.0–1.0)
pH: 6 (ref 5.0–8.0)

## 2013-08-14 LAB — RAPID URINE DRUG SCREEN, HOSP PERFORMED
Amphetamines: NOT DETECTED
Barbiturates: NOT DETECTED
Benzodiazepines: NOT DETECTED
Cocaine: NOT DETECTED
Opiates: POSITIVE — AB
Tetrahydrocannabinol: NOT DETECTED

## 2013-08-14 LAB — TROPONIN I: Troponin I: <0.3 ng/mL (ref <0.30)

## 2013-08-14 LAB — TSH: TSH: 2.127 u[IU]/mL (ref 0.350–4.500)

## 2013-08-14 LAB — GLUCOSE, CAPILLARY: Glucose-Capillary: 143 mg/dL — ABNORMAL HIGH (ref 70–99)

## 2013-08-14 MED ORDER — ADULT MULTIVITAMIN W/MINERALS CH
1.0000 | ORAL_TABLET | Freq: Every day | ORAL | Status: DC
  Administered 2013-08-15: 1 via ORAL
  Filled 2013-08-14: qty 1

## 2013-08-14 MED ORDER — SIMVASTATIN 10 MG PO TABS
10.0000 mg | ORAL_TABLET | Freq: Every day | ORAL | Status: DC
  Administered 2013-08-14 – 2013-08-15 (×2): 10 mg via ORAL
  Filled 2013-08-14 (×2): qty 1

## 2013-08-14 MED ORDER — GABAPENTIN 300 MG PO CAPS
300.0000 mg | ORAL_CAPSULE | Freq: Every day | ORAL | Status: DC
  Administered 2013-08-14: 300 mg via ORAL
  Filled 2013-08-14 (×2): qty 1

## 2013-08-14 MED ORDER — OXYCODONE HCL 5 MG PO TABS: 5.0000 mg | ORAL_TABLET | ORAL | Status: DC | PRN

## 2013-08-14 MED ORDER — RISAQUAD PO CAPS
1.0000 | ORAL_CAPSULE | Freq: Every day | ORAL | Status: DC
  Administered 2013-08-15: 1 via ORAL
  Filled 2013-08-14 (×2): qty 1

## 2013-08-14 MED ORDER — OXYCODONE-ACETAMINOPHEN 5-325 MG PO TABS: 1.0000 | ORAL_TABLET | Freq: Four times a day (QID) | ORAL | Status: DC | PRN

## 2013-08-14 MED ORDER — HEPARIN SODIUM (PORCINE) 5000 UNIT/ML IJ SOLN
5000.0000 [IU] | Freq: Three times a day (TID) | INTRAMUSCULAR | Status: DC
  Administered 2013-08-14 – 2013-08-15 (×3): 5000 [IU] via SUBCUTANEOUS
  Filled 2013-08-14 (×5): qty 1

## 2013-08-14 MED ORDER — SENNOSIDES-DOCUSATE SODIUM 8.6-50 MG PO TABS: 1.0000 | ORAL_TABLET | Freq: Every day | ORAL | Status: DC

## 2013-08-14 MED ORDER — ESTRADIOL-NORETHINDRONE ACET 1-0.5 MG PO TABS
1.0000 | ORAL_TABLET | Freq: Every day | ORAL | Status: DC
  Administered 2013-08-15: 1 via ORAL

## 2013-08-14 MED ORDER — HYDROCODONE-ACETAMINOPHEN 5-325 MG PO TABS
1.0000 | ORAL_TABLET | Freq: Four times a day (QID) | ORAL | Status: DC | PRN
  Administered 2013-08-14 – 2013-08-15 (×3): 2 via ORAL
  Filled 2013-08-14 (×3): qty 2

## 2013-08-14 MED ORDER — ACIDOPHILUS 100 MG PO CAPS
100.0000 mg | ORAL_CAPSULE | Freq: Every day | ORAL | Status: DC
Start: 2013-08-14 — End: 2013-08-14

## 2013-08-14 MED ORDER — FISH OIL 1200 MG PO CAPS: 1.0000 | ORAL_CAPSULE | Freq: Every day | ORAL | Status: DC

## 2013-08-14 MED ORDER — ACETAMINOPHEN 325 MG PO TABS: 650.0000 mg | ORAL_TABLET | Freq: Four times a day (QID) | ORAL | Status: DC | PRN

## 2013-08-14 MED ORDER — CALCIUM CARBONATE-VITAMIN D 500-200 MG-UNIT PO TABS
2.0000 | ORAL_TABLET | Freq: Two times a day (BID) | ORAL | Status: DC
  Administered 2013-08-15: 2 via ORAL
  Filled 2013-08-14 (×3): qty 2

## 2013-08-14 MED ORDER — CELECOXIB 200 MG PO CAPS
200.0000 mg | ORAL_CAPSULE | Freq: Every day | ORAL | Status: DC
  Administered 2013-08-15: 200 mg via ORAL
  Filled 2013-08-14 (×2): qty 1

## 2013-08-14 MED ORDER — OMEGA-3-ACID ETHYL ESTERS 1 G PO CAPS
1.0000 g | ORAL_CAPSULE | Freq: Every day | ORAL | Status: DC
  Administered 2013-08-15: 1 g via ORAL
  Filled 2013-08-14 (×2): qty 1

## 2013-08-14 MED ORDER — METHOCARBAMOL 500 MG PO TABS
500.0000 mg | ORAL_TABLET | Freq: Four times a day (QID) | ORAL | Status: DC | PRN
  Administered 2013-08-14 – 2013-08-15 (×2): 500 mg via ORAL
  Filled 2013-08-14 (×2): qty 1

## 2013-08-14 MED ORDER — CALCIUM CITRATE-VITAMIN D 500-400 MG-UNIT PO CHEW: 2.0000 | CHEWABLE_TABLET | Freq: Two times a day (BID) | ORAL | Status: DC

## 2013-08-14 MED ORDER — ASPIRIN EC 325 MG PO TBEC
325.0000 mg | DELAYED_RELEASE_TABLET | Freq: Every day | ORAL | Status: DC
  Administered 2013-08-15: 325 mg via ORAL
  Filled 2013-08-14 (×2): qty 1

## 2013-08-14 NOTE — ED Provider Notes (Signed)
CSN: [id number]     Arrival date & time 08/14/13  1426 History   First MD Initiated Contact with Patient 08/14/13 1438     Chief Complaint  Patient presents with  . Stroke Symptoms   (Consider location/radiation/quality/duration/timing/severity/associated sxs/prior Treatment) The history is provided by the patient.   patient was recently inpatient for a left hip replacement. She's been healing well and was discharged on Friday. She states later on Friday she began to have double vision and nausea the feeling like her vision was spinning around. She states she thought it was the Percocet. She switched to hydrocodone. She continued to have symptoms. She had a headache during the stay in the hospital but none now. She was told at church today that her face was drooping. She had nausea and had vomiting. No difficulty moving her arms or legs.  Past Medical History  Diagnosis Date  . Arthritis   . Hyperlipidemia   . Hypertension   . Complication of anesthesia     difficulty urinating after   Past Surgical History  Procedure Laterality Date  . Osteotomy  1974 & 1975  . Cesarean section      x 2  . Bunionectomy    . Total hip arthroplasty Right 01/18/2013    Procedure: Right TOTAL HIP ARTHROPLASTY ANTERIOR APPROACH;  Surgeon: Kathryne Hitch, MD;  Location: [facility] OR;  Service: Orthopedics;  Laterality: Right;  . Hernia repair Left 1977    inguinal  . Total hip arthroplasty Left 08/09/2013    Procedure: LEFT TOTAL HIP ARTHROPLASTY ANTERIOR APPROACH;  Surgeon: Kathryne Hitch, MD;  Location: [facility] OR;  Service: Orthopedics;  Laterality: Left;   Family History  Problem Relation Age of Onset  . Arthritis Mother     osateoarthritis  . Hyperlipidemia Mother   . Heart disease Mother   . Hypertension Mother   . Hyperlipidemia Father   . Heart disease Father   . Stroke Father   . Parkinson's disease Father   . Cancer Maternal Grandfather     lung  . Cancer Cousin     breast    History  Substance Use Topics  . Smoking status: Never Smoker   . Smokeless tobacco: Never Used  . Alcohol Use: 1.8 oz/week    3 Glasses of wine per week   OB History   Grav Para Term Preterm Abortions TAB SAB Ect Mult Living                 Review of Systems  Constitutional: Negative for activity change and appetite change.  HENT: Negative for neck stiffness.   Eyes: Positive for visual disturbance. Negative for pain.  Respiratory: Negative for chest tightness and shortness of breath.   Cardiovascular: Negative for chest pain and leg swelling.  Gastrointestinal: Positive for nausea and vomiting. Negative for abdominal pain and diarrhea.  Genitourinary: Negative for flank pain.  Musculoskeletal: Negative for back pain.  Skin: Negative for rash.  Neurological: Negative for weakness, numbness and headaches.  Psychiatric/Behavioral: Negative for behavioral problems.    Allergies  Achromycin  Home Medications   Current Outpatient Rx  Name  Route  Sig  Dispense  Refill  . aspirin EC 325 MG EC tablet   Oral   Take 1 tablet (325 mg total) by mouth daily with breakfast.   45 tablet   0   . Calcium Citrate-Vitamin D 500-400 MG-UNIT CHEW   Oral   Chew 2 tablets by mouth 2 (two) times daily.         Marland Kitchen  celecoxib (CELEBREX) 200 MG capsule   Oral   Take 1 capsule (200 mg total) by mouth daily.   30 capsule   1   . diphenhydrAMINE (BENADRYL) 12.5 MG/5ML liquid   Oral   Take 25 mg by mouth daily as needed (for dry heaving).         Marland Kitchen estradiol-norethindrone (MIMVEY) 1-0.5 MG per tablet   Oral   Take 1 tablet by mouth daily.   30 tablet   5   . gabapentin (NEURONTIN) 300 MG capsule   Oral   Take 1 capsule (300 mg total) by mouth at bedtime.   30 capsule   0   . HYDROcodone-acetaminophen (NORCO/VICODIN) 5-325 MG per tablet   Oral   Take 1-2 tablets by mouth every 6 (six) hours as needed for pain.         . Lactobacillus (ACIDOPHILUS) 100 MG CAPS   Oral    Take 100 mg by mouth daily.         Marland Kitchen lisinopril (PRINIVIL,ZESTRIL) 20 MG tablet   Oral   Take 20 mg by mouth daily.         . methocarbamol (ROBAXIN) 500 MG tablet   Oral   Take 500 mg by mouth every 6 (six) hours as needed (for muscle spasms).         . Multiple Vitamin (MULTIVITAMIN WITH MINERALS) TABS   Oral   Take 1 tablet by mouth daily. Centrum Silver         . Omega-3 Fatty Acids (FISH OIL) 1200 MG CAPS   Oral   Take 1 capsule by mouth daily.         . pravastatin (PRAVACHOL) 20 MG tablet   Oral   Take 1 tablet (20 mg total) by mouth daily.   30 tablet   5   . promethazine (PHENERGAN) 25 MG tablet   Oral   Take 25 mg by mouth every 6 (six) hours as needed. For nausea          BP 124/74  Pulse 81  Temp(Src) 98 F (36.7 C) (Oral)  Resp 22  SpO2 99%  LMP 11/17/2010 Physical Exam  Nursing note and vitals reviewed. Constitutional: She is oriented to person, place, and time. She appears well-developed and well-nourished.  HENT:  Head: Normocephalic and atraumatic.  Eyes: Pupils are equal, round, and reactive to light.  Neck: Normal range of motion. Neck supple.  Cardiovascular: Normal rate, regular rhythm and normal heart sounds.   No murmur heard. Pulmonary/Chest: Effort normal and breath sounds normal. No respiratory distress. She has no wheezes. She has no rales.  Abdominal: Soft. Bowel sounds are normal. She exhibits no distension. There is no tenderness. There is no rebound and no guarding.  Musculoskeletal: Normal range of motion.  Neurological: She is alert and oriented to person, place, and time. A cranial nerve deficit is present.  Mild nystagmus. Eyes will not cross the midline to the right. Left-sided ptosis  Skin: Skin is warm and dry.  Psychiatric: She has a normal mood and affect. Her speech is normal.    ED Course  Procedures (including critical care time) Labs Review Labs Reviewed  COMPREHENSIVE METABOLIC PANEL - Abnormal;  Notable for the following:    Albumin 3.2 (*)    Total Bilirubin 0.2 (*)    All other components within normal limits  URINALYSIS, ROUTINE W REFLEX MICROSCOPIC - Abnormal; Notable for the following:    APPearance TURBID (*)  Leukocytes, UA SMALL (*)    All other components within normal limits  URINE MICROSCOPIC-ADD ON - Abnormal; Notable for the following:    Squamous Epithelial / LPF MANY (*)    Bacteria, UA MANY (*)    All other components within normal limits  URINE CULTURE  ETHANOL  PROTIME-INR  APTT  CBC  DIFFERENTIAL  TROPONIN I  URINE RAPID DRUG SCREEN (HOSP PERFORMED)  POCT I-STAT, CHEM 8   Imaging Review Ct Head (brain) Wo Contrast  08/14/2013   CLINICAL DATA:  Dizziness and nausea. Left facial droop. Recently postop from hip surgery.  EXAM: CT HEAD WITHOUT CONTRAST  TECHNIQUE: Contiguous axial images were obtained from the base of the skull through the vertex without intravenous contrast.  COMPARISON:  None.  FINDINGS: No evidence of intracranial hemorrhage, brain edema, or other signs of acute infarction. No evidence of intracranial mass lesion or mass effect. No abnormal extraaxial fluid collections identified. Ventricles are normal in size. No skull abnormality identified.  IMPRESSION: Negative noncontrast head CT.   Electronically Signed   By: Myles Rosenthal   On: 08/14/2013 16:02    Date: 08/14/2013  Rate:75  Rhythm: normal sinus rhythm  QRS Axis: normal  Intervals: normal  ST/T Wave abnormalities: normal  Conduction Disutrbances: none  Narrative Interpretation: unremarkable     MDM  No diagnosis found. Patient presents with neurologic deficits. Head CT reassuring. Out of the window for TPA do to timing. Will be admitted to internal medicine with neurology consult.    Juliet Rude. Rubin Payor, MD 08/14/13 1642

## 2013-08-14 NOTE — Consult Note (Signed)
Referring Physician: Oti    Chief Complaint: Difficulty with vision  HPI: Melissa Grimes is an 57 y.o. female who reports that she was released from the hospital on 9/26 after hip surgery.  At 6pm that evening she noticed a change in vision.  She had taken some pain medication previously.  She then became nauseous and had vomiting.  She felt as if the room was spinning and this was worse when she attempted to stand.  She had nausea and vomiting all evening that was felt to be secondary to the pain medication.  When she awakened on Saturday she still had the nausea.  She did get a prescription for the nausea which helped but her vision issues remained.  She was seen by PT who felt she had a facial droop and her husband felt she as having difficulty focusing (her eyes were not facing the same direction).  She slept well last evening but with no improvement they presented for evaluation today.      Date last known well: Date: 08/12/2013 Time last known well: Time: 18:00 tPA Given: No: Outside time window  Past Medical History  Diagnosis Date  . Arthritis   . Hyperlipidemia   . Hypertension   . Complication of anesthesia     difficulty urinating after    Past Surgical History  Procedure Laterality Date  . Osteotomy  1974 & 1975  . Cesarean section      x 2  . Bunionectomy    . Total hip arthroplasty Right 01/18/2013    Procedure: Right TOTAL HIP ARTHROPLASTY ANTERIOR APPROACH;  Surgeon: Kathryne Hitch, MD;  Location: [facility] OR;  Service: Orthopedics;  Laterality: Right;  . Hernia repair Left 1977    inguinal  . Total hip arthroplasty Left 08/09/2013    Procedure: LEFT TOTAL HIP ARTHROPLASTY ANTERIOR APPROACH;  Surgeon: Kathryne Hitch, MD;  Location: [facility] OR;  Service: Orthopedics;  Laterality: Left;    Family History  Problem Relation Age of Onset  . Arthritis Mother     osateoarthritis  . Hyperlipidemia Mother   . Heart disease Mother   . Hypertension Mother   .  Hyperlipidemia Father   . Heart disease Father   . Stroke Father   . Parkinson's disease Father   . Cancer Maternal Grandfather     lung  . Cancer Cousin     breast   Social History:  reports that she has never smoked. She has never used smokeless tobacco. She reports that she drinks about 1.8 ounces of alcohol per week. She reports that she does not use illicit drugs.  Allergies:  Allergies  Allergen Reactions  . Achromycin [Tetracycline] Hives    Medications: I have reviewed the patient's current medications. Prior to Admission:  Current outpatient prescriptions: aspirin EC 325 MG EC tablet, Take 1 tablet (325 mg total) by mouth daily with breakfast., Disp: 45 tablet, Rfl: 0;   Calcium Citrate-Vitamin D 500-400 MG-UNIT CHEW, Chew 2 tablets by mouth 2 (two) times daily., Disp: , Rfl: ;   celecoxib (CELEBREX) 200 MG capsule, Take 1 capsule (200 mg total) by mouth daily., Disp: 30 capsule, Rfl: 1 diphenhydrAMINE (BENADRYL) 12.5 MG/5ML liquid, Take 25 mg by mouth daily as needed (for dry heaving)., Disp: , Rfl: ;   estradiol-norethindrone (MIMVEY) 1-0.5 MG per tablet, Take 1 tablet by mouth daily., Disp: 30 tablet, Rfl: 5;   gabapentin (NEURONTIN) 300 MG capsule, Take 1 capsule (300 mg total) by mouth at bedtime., Disp: 30  capsule, Rfl: 0 HYDROcodone-acetaminophen (NORCO/VICODIN) 5-325 MG per tablet, Take 1-2 tablets by mouth every 6 (six) hours as needed for pain., Disp: , Rfl: ;  Lactobacillus (ACIDOPHILUS) 100 MG CAPS, Take 100 mg by mouth daily., Disp: , Rfl: ;  lisinopril (PRINIVIL,ZESTRIL) 20 MG tablet, Take 20 mg by mouth daily., Disp: , Rfl: ;   methocarbamol (ROBAXIN) 500 MG tablet, Take 500 mg by mouth every 6 (six) hours as needed (for muscle spasms)., Disp: , Rfl:  Multiple Vitamin (MULTIVITAMIN WITH MINERALS) TABS, Take 1 tablet by mouth daily. Centrum Silver, Disp: , Rfl: ;   Omega-3 Fatty Acids (FISH OIL) 1200 MG CAPS, Take 1 capsule by mouth daily., Disp: , Rfl: ;    pravastatin (PRAVACHOL) 20 MG tablet, Take 1 tablet (20 mg total) by mouth daily., Disp: 30 tablet, Rfl: 5;   promethazine (PHENERGAN) 25 MG tablet, Take 25 mg by mouth every 6 (six) hours as needed. For nausea, Disp: , Rfl:   ROS: History obtained from the patient  General ROS: negative for - chills, fatigue, fever, night sweats, weight gain or weight loss Psychological ROS: negative for - behavioral disorder, hallucinations, memory difficulties, mood swings or suicidal ideation Ophthalmic ROS: negative for - blurry vision, double vision, eye pain or loss of vision ENT ROS: negative for - epistaxis, nasal discharge, oral lesions, sore throat, tinnitus or vertigo Allergy and Immunology ROS: negative for - hives or itchy/watery eyes Hematological and Lymphatic ROS: negative for - bleeding problems, bruising or swollen lymph nodes Endocrine ROS: negative for - galactorrhea, hair pattern changes, polydipsia/polyuria or temperature intolerance Respiratory ROS: negative for - cough, hemoptysis, shortness of breath or wheezing Cardiovascular ROS: negative for - chest pain, dyspnea on exertion, edema or irregular heartbeat Gastrointestinal ROS: negative for - abdominal pain, diarrhea, hematemesis, nausea/vomiting or stool incontinence Genito-Urinary ROS: negative for - dysuria, hematuria, incontinence or urinary frequency/urgency Musculoskeletal ROS: left hip pain Neurological ROS: as noted in HPI Dermatological ROS: negative for rash and skin lesion changes  Physical Examination: Blood pressure 134/73, pulse 81, temperature 98 F (36.7 C), temperature source Oral, resp. rate 18, last menstrual period 11/17/2010, SpO2 98.00%.  Neurologic Examination: Mental Status: Alert, oriented, thought content appropriate.  Speech fluent without evidence of aphasia.  Able to follow 3 step commands without difficulty. Cranial Nerves: II: Discs flat bilaterally; Visual fields grossly normal, pupils equal,  round, reactive to light and accommodation III,IV, VI: mild left ptosis, extra-ocular motions intact in the right eye, patient unable to go beyond midline with the left eye.  Remaining EOM's intact.  Nystagmus on looking to the left V,VII: decreased left NLF, facial light touch sensation normal bilaterally VIII: hearing normal bilaterally IX,X: gag reflex present XI: bilateral shoulder shrug XII: midline tongue extension Motor: Right : Upper extremity   5/5    Left:     Upper extremity   5/5  Lower extremity   5/5     Lower extremity   Limitation secondary to recent surgery Tone and bulk:normal tone throughout; no atrophy noted Sensory: Pinprick and light touch intact throughout, bilaterally Deep Tendon Reflexes: 2+ and symmetric throughout Plantars: Right: downgoing   Left: equivocal Cerebellar: normal finger-to-nose and normal heel-to-shin testing.  Not performed with LLE Gait: unable to test CV: pulses palpable throughout   Laboratory Studies:  Basic Metabolic Panel:  Recent Labs Lab 08/10/13 0840 08/14/13 1504 08/14/13 1528  NA 136 139 141  K 3.4* 3.7 3.9  CL 103 102 102  CO2 26 26  --  GLUCOSE 111* 95 96  BUN 8 12 15   CREATININE 0.62 0.53 0.70  CALCIUM 8.3* 8.6  --     Liver Function Tests:  Recent Labs Lab 08/14/13 1504  AST 23  ALT 17  ALKPHOS 53  BILITOT 0.2*  PROT 7.0  ALBUMIN 3.2*   No results found for this basename: LIPASE, AMYLASE,  in the last 168 hours No results found for this basename: AMMONIA,  in the last 168 hours  CBC:  Recent Labs Lab 08/10/13 0840 08/11/13 0955 08/12/13 0745 08/14/13 1504 08/14/13 1528  WBC 9.3 6.9 7.4 7.6  --   NEUTROABS  --   --   --  4.9  --   HGB 11.8* 11.1* 11.3* 12.6 12.9  HCT 34.9* 33.0* 33.7* 37.0 38.0  MCV 95.4 95.1 96.0 94.6  --   PLT 267 241 258 375  --     Cardiac Enzymes:  Recent Labs Lab 08/14/13 1504  TROPONINI <0.30    BNP: No components found with this basename: POCBNP,    CBG: No results found for this basename: GLUCAP,  in the last 168 hours  Microbiology: Results for orders placed during the hospital encounter of 08/01/13  SURGICAL PCR SCREEN     Status: None   Collection Time    08/01/13  1:56 PM      Result Value Range Status   MRSA, PCR NEGATIVE  NEGATIVE Final   Staphylococcus aureus NEGATIVE  NEGATIVE Final   Comment:            The Xpert SA Assay (FDA     approved for NASAL specimens     in patients over 54 years of age),     is one component of     a comprehensive surveillance     program.  Test performance has     been validated by [employer] for patients greater     than or equal to 32 year old.     It is not intended     to diagnose infection nor to     guide or monitor treatment.    Coagulation Studies:  Recent Labs  08/14/13 1504  LABPROT 12.3  INR 0.93    Urinalysis:  Recent Labs Lab 08/14/13 1546  COLORURINE YELLOW  LABSPEC 1.018  PHURINE 6.0  GLUCOSEU NEGATIVE  HGBUR NEGATIVE  BILIRUBINUR NEGATIVE  KETONESUR NEGATIVE  PROTEINUR NEGATIVE  UROBILINOGEN 0.2  NITRITE NEGATIVE  LEUKOCYTESUR SMALL*    Lipid Panel:    Component Value Date/Time   CHOL 189 12/22/2012 0928   TRIG 144 12/22/2012 0928   HDL 56 12/22/2012 0928   CHOLHDL 3.4 12/22/2012 0928   VLDL 29 12/22/2012 0928   LDLCALC 104* 12/22/2012 0928    HgbA1C:  No results found for this basename: HGBA1C    Urine Drug Screen:     Component Value Date/Time   LABOPIA POSITIVE* 08/14/2013 1546   COCAINSCRNUR NONE DETECTED 08/14/2013 1546   LABBENZ NONE DETECTED 08/14/2013 1546   AMPHETMU NONE DETECTED 08/14/2013 1546   THCU NONE DETECTED 08/14/2013 1546   LABBARB NONE DETECTED 08/14/2013 1546    Alcohol Level:  Recent Labs Lab 08/14/13 1504  ETH <11    Other results: EKG: sinus rhythm at 80 bpm.  Imaging: Ct Head (brain) Wo Contrast  08/14/2013   CLINICAL DATA:  Dizziness and nausea. Left facial droop. Recently postop from hip surgery.  EXAM:  CT HEAD WITHOUT CONTRAST  TECHNIQUE: Contiguous axial  images were obtained from the base of the skull through the vertex without intravenous contrast.  COMPARISON:  None.  FINDINGS: No evidence of intracranial hemorrhage, brain edema, or other signs of acute infarction. No evidence of intracranial mass lesion or mass effect. No abnormal extraaxial fluid collections identified. Ventricles are normal in size. No skull abnormality identified.  IMPRESSION: Negative noncontrast head CT.   Electronically Signed   By: Myles Rosenthal   On: 08/14/2013 16:02    Assessment: 57 y.o. female presenting with EOM abnormalities, ptosis and a mild facial droop.  Patient does have small vessel risk factors and is s/p surgery.  Acute infarct is on the differential but would also consider myasthenia gravis.    Stroke Risk Factors - hyperlipidemia and hypertension  Plan: 1. MRI, MRA  of the brain without contrast 2. Myasthenia panel, TSH  Thana Farr, MD [facility] [phone] 08/14/2013, 4:50 PM

## 2013-08-14 NOTE — ED Notes (Addendum)
Pt was discharged from [facility] on Friday after L hip replacement. On Friday she began to have L facial droop and L eye gaze. physical therapist noticed symptoms  today and encouraged pt to come in today for eval. A&Ox4, speech is clear, grips= bilateral. Pt has been dizzy and nauseated over weekend as well. family reports pt has been doing well post hip replacement, has been ambulating with walker, and was able to dress herself this am

## 2013-08-14 NOTE — H&P (Signed)
PCP:   Lemont Fillers., NP   Chief Complaint:  Dizziness.   HPI: This is a 57 year old female, with known history of dyslipidemia, HTN, OA, s/p left THA 01/18/13, and left THA on 08/09/13. According to patient, she was discharged on 08/12/13, got home at about 6:00 PM, and took some Oxycodone for pain, and 30 minutes later, she had sudden onset dizziness, and a sensation of "spinning" followed by nausea and double vision. She vomited all night, and in AM of 08/13/13, her husband had to call the orthopedic office, and an antiemetic was prescribed. Although nausea has since improved, her visual symptoms and dizziness have persisted. Patient denies antecedent URI or fever, she has no dysarthria or limb weakness.   Allergies:   Allergies  Allergen Reactions  . Achromycin [Tetracycline] Hives      Past Medical History  Diagnosis Date  . Arthritis   . Hyperlipidemia   . Hypertension   . Complication of anesthesia     difficulty urinating after    Past Surgical History  Procedure Laterality Date  . Osteotomy  1974 & 1975  . Cesarean section      x 2  . Bunionectomy    . Total hip arthroplasty Right 01/18/2013    Procedure: Right TOTAL HIP ARTHROPLASTY ANTERIOR APPROACH;  Surgeon: Kathryne Hitch, MD;  Location[facility] OR;  Service: Orthopedics;  Laterality: Right;  . Hernia repair Left 1977    inguinal  . Total hip arthroplasty Left 08/09/2013    Procedure: LEFT TOTAL HIP ARTHROPLASTY ANTERIOR APPROACH;  Surgeon: Kathryne Hitch, MD;  Location: [facility] OR;  Service: Orthopedics;  Laterality: Left;    Prior to Admission medications   Medication Sig Start Date End Date Taking? Authorizing Provider  aspirin EC 325 MG EC tablet Take 1 tablet (325 mg total) by mouth daily with breakfast. 08/11/13  Yes Richardean Canal, PA-C  Calcium Citrate-Vitamin D 500-400 MG-UNIT CHEW Chew 2 tablets by mouth 2 (two) times daily.   Yes Historical Provider, MD  celecoxib (CELEBREX) 200 MG capsule Take  1 capsule (200 mg total) by mouth daily. 08/11/13  Yes Richardean Canal, PA-C  diphenhydrAMINE (BENADRYL) 12.5 MG/5ML liquid Take 25 mg by mouth daily as needed (for dry heaving).   Yes Historical Provider, MD  estradiol-norethindrone (MIMVEY) 1-0.5 MG per tablet Take 1 tablet by mouth daily. 12/01/12  Yes Sandford Craze, NP  gabapentin (NEURONTIN) 300 MG capsule Take 1 capsule (300 mg total) by mouth at bedtime. 08/11/13  Yes Richardean Canal, PA-C  HYDROcodone-acetaminophen (NORCO/VICODIN) 5-325 MG per tablet Take 1-2 tablets by mouth every 6 (six) hours as needed for pain.   Yes Historical Provider, MD  Lactobacillus (ACIDOPHILUS) 100 MG CAPS Take 100 mg by mouth daily.   Yes Historical Provider, MD  lisinopril (PRINIVIL,ZESTRIL) 20 MG tablet Take 20 mg by mouth daily.   Yes Historical Provider, MD  methocarbamol (ROBAXIN) 500 MG tablet Take 500 mg by mouth every 6 (six) hours as needed (for muscle spasms).   Yes Historical Provider, MD  Multiple Vitamin (MULTIVITAMIN WITH MINERALS) TABS Take 1 tablet by mouth daily. Centrum Silver   Yes Historical Provider, MD  Omega-3 Fatty Acids (FISH OIL) 1200 MG CAPS Take 1 capsule by mouth daily.   Yes Historical Provider, MD  pravastatin (PRAVACHOL) 20 MG tablet Take 1 tablet (20 mg total) by mouth daily. 07/12/13  Yes Sandford Craze, NP  promethazine (PHENERGAN) 25 MG tablet Take 25 mg by mouth every 6 (six) hours as  needed. For nausea 08/13/13  Yes Historical Provider, MD    Social History: Patient reports that she has never smoked. She has never used smokeless tobacco. She reports that she drinks about 1.8 ounces of alcohol per week. She reports that she does not use illicit drugs.  Family History  Problem Relation Age of Onset  . Arthritis Mother     osateoarthritis  . Hyperlipidemia Mother   . Heart disease Mother   . Hypertension Mother   . Hyperlipidemia Father   . Heart disease Father   . Stroke Father   . Parkinson's disease Father   .  Cancer Maternal Grandfather     lung  . Cancer Cousin     breast    Review of Systems:  As per HPI and chief complaint. Patient denies fatigue, diminished appetite, weight loss, fever, chills, headache, blurred vision, difficulty in speaking, dysphagia, chest pain, cough, shortness of breath, orthopnea, paroxysmal nocturnal dyspnea, nausea, diaphoresis, abdominal pain, diarrhea, belching, heartburn, hematemesis, melena, dysuria, nocturia, urinary frequency, hematochezia, lower extremity swelling, pain, or redness. The rest of the systems review is negative.  Physical Exam:  General:  Patient does not appear to be in obvious acute distress. Alert, communicative, fully oriented, talking in complete sentences, not short of breath at rest.  HEENT:  No clinical pallor, no jaundice, no conjunctival injection or discharge. Hydration status is satisfactory.  NECK:  Supple, JVP not seen, no carotid bruits, no palpable lymphadenopathy, no palpable goiter. CHEST:  Clinically clear to auscultation, no wheezes, no crackles. HEART:  Sounds 1 and 2 heard, normal, regular, no murmurs. ABDOMEN:  Full, soft, non-tender, no palpable organomegaly, no palpable masses, normal bowel sounds. GENITALIA:  Not examined. LOWER EXTREMITIES:  No pitting edema, palpable peripheral pulses. MUSCULOSKELETAL SYSTEM:  Surgical site left hip is unremarkable. The rest of the musculo-skeletal system examination is unremarkable. 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 No other focal neurologic deficit is elicited.   Labs on Admission:  Results for orders placed during the hospital encounter of 08/14/13 (from the past 48 hour(s))  ETHANOL     Status: None   Collection Time    08/14/13  3:04 PM      Result Value Range   Alcohol, Ethyl (B) <11  0 - 11 mg/dL    Comment:            LOWEST DETECTABLE LIMIT FOR     SERUM ALCOHOL IS 11 mg/dL     FOR MEDICAL PURPOSES ONLY  PROTIME-INR     Status: None   Collection Time    08/14/13  3:04 PM      Result Value Range   Prothrombin Time 12.3  11.6 - 15.2 seconds   INR 0.93  0.00 - 1.49  APTT     Status: None   Collection Time    08/14/13  3:04 PM      Result Value Range   aPTT 27  24 - 37 seconds  CBC     Status: None   Collection Time    08/14/13  3:04 PM      Result Value Range   WBC 7.6  4.0 - 10.5 K/uL   RBC 3.91  3.87 - 5.11 MIL/uL   Hemoglobin 12.6  12.0 - 15.0 g/dL   HCT 47.8  29.5 -  46.0 %   MCV 94.6  78.0 - 100.0 fL   MCH 32.2  26.0 - 34.0 pg   MCHC 34.1  30.0 - 36.0 g/dL   RDW 16.1  09.6 - 04.5 %   Platelets 375  150 - 400 K/uL  DIFFERENTIAL     Status: None   Collection Time    08/14/13  3:04 PM      Result Value Range   Neutrophils Relative % 64  43 - 77 %   Neutro Abs 4.9  1.7 - 7.7 K/uL   Lymphocytes Relative 25  12 - 46 %   Lymphs Abs 1.9  0.7 - 4.0 K/uL   Monocytes Relative 7  3 - 12 %   Monocytes Absolute 0.5  0.1 - 1.0 K/uL   Eosinophils Relative 4  0 - 5 %   Eosinophils Absolute 0.3  0.0 - 0.7 K/uL   Basophils Relative 1  0 - 1 %   Basophils Absolute 0.1  0.0 - 0.1 K/uL  COMPREHENSIVE METABOLIC PANEL     Status: Abnormal   Collection Time    08/14/13  3:04 PM      Result Value Range   Sodium 139  135 - 145 mEq/L   Potassium 3.7  3.5 - 5.1 mEq/L   Chloride 102  96 - 112 mEq/L   CO2 26  19 - 32 mEq/L   Glucose, Bld 95  70 - 99 mg/dL   BUN 12  6 - 23 mg/dL   Creatinine, Ser 4.09  0.50 - 1.10 mg/dL   Calcium 8.6  8.4 - 81.1 mg/dL   Total Protein 7.0  6.0 - 8.3 g/dL   Albumin 3.2 (*) 3.5 - 5.2 g/dL   AST 23  0 - 37 U/L   ALT 17  0 - 35 U/L   Alkaline Phosphatase 53  39 - 117 U/L   Total Bilirubin 0.2 (*) 0.3 - 1.2 mg/dL   GFR calc non Af Amer >90  >90 mL/min   GFR calc Af Amer >90  >90 mL/min   Comment: (NOTE)     The eGFR has been calculated using the CKD  EPI equation.     This calculation has not been validated in all clinical situations.     eGFR's persistently <90 mL/min signify possible Chronic Kidney     Disease.  TROPONIN I     Status: None   Collection Time    08/14/13  3:04 PM      Result Value Range   Troponin I <0.30  <0.30 ng/mL   Comment:            Due to the release kinetics of cTnI,     a negative result within the first hours     of the onset of symptoms does not rule out     myocardial infarction with certainty.     If myocardial infarction is still suspected,     repeat the test at appropriate intervals.  POCT I-STAT, CHEM 8     Status: None   Collection Time    08/14/13  3:28 PM      Result Value Range   Sodium 141  135 - 145 mEq/L   Potassium 3.9  3.5 - 5.1 mEq/L   Chloride 102  96 - 112 mEq/L   BUN 15  6 - 23 mg/dL   Creatinine, Ser 9.14  0.50 - 1.10 mg/dL   Glucose, Bld 96  70 - 99  mg/dL   Calcium, Ion 1.61  0.96 - 1.23 mmol/L   TCO2 29  0 - 100 mmol/L   Hemoglobin 12.9  12.0 - 15.0 g/dL   HCT 04.5  40.9 - 81.1 %  URINE RAPID DRUG SCREEN (HOSP PERFORMED)     Status: Abnormal   Collection Time    08/14/13  3:46 PM      Result Value Range   Opiates POSITIVE (*) NONE DETECTED   Cocaine NONE DETECTED  NONE DETECTED   Benzodiazepines NONE DETECTED  NONE DETECTED   Amphetamines NONE DETECTED  NONE DETECTED   Tetrahydrocannabinol NONE DETECTED  NONE DETECTED   Barbiturates NONE DETECTED  NONE DETECTED   Comment:            DRUG SCREEN FOR MEDICAL PURPOSES     ONLY.  IF CONFIRMATION IS NEEDED     FOR ANY PURPOSE, NOTIFY LAB     WITHIN 5 DAYS.                LOWEST DETECTABLE LIMITS     FOR URINE DRUG SCREEN     Drug Class       Cutoff (ng/mL)     Amphetamine      1000     Barbiturate      200     Benzodiazepine   200     Tricyclics       300     Opiates          300     Cocaine          300     THC              50  URINALYSIS, ROUTINE W REFLEX MICROSCOPIC     Status: Abnormal   Collection Time     08/14/13  3:46 PM      Result Value Range   Color, Urine YELLOW  YELLOW   APPearance TURBID (*) CLEAR   Specific Gravity, Urine 1.018  1.005 - 1.030   pH 6.0  5.0 - 8.0   Glucose, UA NEGATIVE  NEGATIVE mg/dL   Hgb urine dipstick NEGATIVE  NEGATIVE   Bilirubin Urine NEGATIVE  NEGATIVE   Ketones, ur NEGATIVE  NEGATIVE mg/dL   Protein, ur NEGATIVE  NEGATIVE mg/dL   Urobilinogen, UA 0.2  0.0 - 1.0 mg/dL   Nitrite NEGATIVE  NEGATIVE   Leukocytes, UA SMALL (*) NEGATIVE  URINE MICROSCOPIC-ADD ON     Status: Abnormal   Collection Time    08/14/13  3:46 PM      Result Value Range   Squamous Epithelial / LPF MANY (*) RARE   WBC, UA 3-6  <3 WBC/hpf   RBC / HPF 0-2  <3 RBC/hpf   Bacteria, UA MANY (*) RARE    Radiological Exams on Admission: Ct Head (brain) Wo Contrast  08/14/2013   CLINICAL DATA:  Dizziness and nausea. Left facial droop. Recently postop from hip surgery.  EXAM: CT HEAD WITHOUT CONTRAST  TECHNIQUE: Contiguous axial images were obtained from the base of the skull through the vertex without intravenous contrast.  COMPARISON:  None.  FINDINGS: No evidence of intracranial hemorrhage, brain edema, or other signs of acute infarction. No evidence of intracranial mass lesion or mass effect. No abnormal extraaxial fluid collections identified. Ventricles are normal in size. No skull abnormality identified.  IMPRESSION: Negative noncontrast head CT.   Electronically Signed   By: Myles Rosenthal   On: 08/14/2013 16:02  Assessment/Plan Active Problems:   Extraocular muscle weakness 1. Gaze palsy/Dizziness: Patient presented with relatively sudden onset dizziness/verigo, nausea, as well as diplopia. Physical examinaton revealed nystagmus on left lateral gaze, a probable left partial ptosis and paralysis of right lateral gaze. Head CT scan was negative for acute findings, and 12-lead EKG showed normal SR. Differential diagnostic considerations include CVA, demyelination, with possible median  longitudinal fasciculus lesion, as well as myasthenia. Dr Thana Farr has provided neurology consultation, and patient will be admitted for appropriate work up, which will include brain MRI/MRA, vascular duplex, 2D echocardiogram and myasthenia gravis panel. If these are unrevealing, further testing will be guided by neurology team. Meanwhile, for low dose ASA.  2. Dyslipidemia: Pre-admission lipid-lowering medication will be continued, and lipid profile as well as TSH, checked.  3. HTN: Controlled at this time.  4. OA (osteoarthritis): Patient is s/p bilateral total hip replacement, the latest (left hip) was on 08/09/13. Stable from this view point.   Further management will depend on clinical course.   Comment: Patient is Full CODE.   Time Spent on Admission: 1 hour.   Tasheba Henson,CHRISTOPHER 08/14/2013, 5:18 PM

## 2013-08-14 NOTE — ED Notes (Signed)
Pt remain in MRI at the time.

## 2013-08-14 NOTE — ED Notes (Signed)
Pt transported to MRI 

## 2013-08-14 NOTE — ED Notes (Signed)
Pt states she began feeling dizzy and nauseated on Friday after being discharged home from L hip surgery. States she continued taking pain medications and resting. Friend now states symptoms have persisted, no improvement. Denies pain at the time. States visual problems to L eye, also complaining of double vision.

## 2013-08-14 NOTE — ED Notes (Signed)
Alinda Money from transport arrived to room for patient @ 1447 for CT scan and there was no patient in assigned room. RN does not know where the patient is, and states that there has not been a patient in this room.

## 2013-08-15 DIAGNOSIS — I6350 Cerebral infarction due to unspecified occlusion or stenosis of unspecified cerebral artery: Principal | ICD-10-CM

## 2013-08-15 DIAGNOSIS — E785 Hyperlipidemia, unspecified: Secondary | ICD-10-CM

## 2013-08-15 DIAGNOSIS — M1990 Unspecified osteoarthritis, unspecified site: Secondary | ICD-10-CM

## 2013-08-15 LAB — LIPID PANEL
Cholesterol: 141 mg/dL (ref 0–200)
HDL: 36 mg/dL — ABNORMAL LOW (ref >39)
LDL Cholesterol: 73 mg/dL (ref 0–99)
Total CHOL/HDL Ratio: 3.9 RATIO
Triglycerides: 158 mg/dL — ABNORMAL HIGH (ref <150)
VLDL: 32 mg/dL (ref 0–40)

## 2013-08-15 LAB — CBC
HCT: 32.7 % — ABNORMAL LOW (ref 36.0–46.0)
Hemoglobin: 11.3 g/dL — ABNORMAL LOW (ref 12.0–15.0)
MCH: 32.3 pg (ref 26.0–34.0)
MCHC: 34.6 g/dL (ref 30.0–36.0)
MCV: 93.4 fL (ref 78.0–100.0)
Platelets: 348 10*3/uL (ref 150–400)
RBC: 3.5 MIL/uL — ABNORMAL LOW (ref 3.87–5.11)
RDW: 12.4 % (ref 11.5–15.5)
WBC: 5.8 10*3/uL (ref 4.0–10.5)

## 2013-08-15 LAB — COMPREHENSIVE METABOLIC PANEL
ALT: 13 U/L (ref 0–35)
AST: 16 U/L (ref 0–37)
Albumin: 2.7 g/dL — ABNORMAL LOW (ref 3.5–5.2)
Alkaline Phosphatase: 44 U/L (ref 39–117)
BUN: 11 mg/dL (ref 6–23)
CO2: 28 mEq/L (ref 19–32)
Calcium: 8.5 mg/dL (ref 8.4–10.5)
Chloride: 102 mEq/L (ref 96–112)
Creatinine, Ser: 0.62 mg/dL (ref 0.50–1.10)
GFR calc Af Amer: >90 mL/min (ref >90)
GFR calc non Af Amer: >90 mL/min (ref >90)
Glucose, Bld: 97 mg/dL (ref 70–99)
Potassium: 3.6 mEq/L (ref 3.5–5.1)
Sodium: 138 mEq/L (ref 135–145)
Total Bilirubin: 0.2 mg/dL — ABNORMAL LOW (ref 0.3–1.2)
Total Protein: 6 g/dL (ref 6.0–8.3)

## 2013-08-15 LAB — HEMOGLOBIN A1C
Hgb A1c MFr Bld: 5.3 % (ref <5.7)
Mean Plasma Glucose: 105 mg/dL (ref <117)

## 2013-08-15 MED ORDER — SENNOSIDES-DOCUSATE SODIUM 8.6-50 MG PO TABS: 1.0000 | ORAL_TABLET | Freq: Every day | ORAL | Status: DC

## 2013-08-15 MED ORDER — ASPIRIN 325 MG PO TBEC: 325.0000 mg | DELAYED_RELEASE_TABLET | Freq: Every day | ORAL | Status: DC

## 2013-08-15 MED ORDER — UNABLE TO FIND: Status: DC

## 2013-08-15 NOTE — Evaluation (Signed)
Physical Therapy Evaluation Patient Details Name: Melissa Grimes MRN: [id number] DOB: 1956/04/17 Today's Date: 08/15/2013 Time: 4098-1191 PT Time Calculation (min): 19 min 2  PT Assessment / Plan / Recommendation History of Present Illness  57 yo female admitted with nausea dizziness and diplopia. 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 Pt recent d/c with Lt THA direct anterior. Pt presents with ptosis.  Clinical Impression  Patient essentially back to baseline for post THR.  Patient modified independent for all mobility.  Limiting factor is double vision.  Patient will benefit from f/up HHPT at discharge for progressive ambulation and strengthening.  Will not follow in hospital.  Encourage patient to ambulate with nursing/family.    PT Assessment  All further PT needs can be met in the next venue of care    Follow Up Recommendations  Home health PT    Does the patient have the potential to tolerate intense rehabilitation      Barriers to Discharge        Equipment Recommendations  None recommended by PT    Recommendations for Other Services     Frequency      Precautions / Restrictions Precautions Precautions: None Precaution Comments: Anterior hip  Restrictions LLE Weight Bearing: Weight bearing as tolerated   Pertinent Vitals/Pain No pain      Mobility  Bed Mobility Bed Mobility: Supine to Sit;Sitting - Scoot to Edge of Bed Supine to Sit: 6: Modified independent (Device/Increase time);HOB elevated Sitting - Scoot to Edge of Bed: 6: Modified independent (Device/Increase time) Details for Bed Mobility Assistance: pt needed incr time and given verbal cues for gaze stabilization to prevent dizziness with mobility Transfers Transfers: Sit to Stand;Stand to Sit Sit to Stand: 6: Modified independent (Device/Increase time);From chair/3-in-1;With upper extremity assist Stand to Sit: 6: Modified independent  (Device/Increase time);To chair/3-in-1;With upper extremity assist Ambulation/Gait Ambulation/Gait Assistance: 6: Modified independent (Device/Increase time) Ambulation Distance (Feet): 300 Feet Assistive device: Rolling walker Gait Pattern: Step-through pattern Modified Rankin (Stroke Patients Only) Pre-Morbid Rankin Score: Slight disability (due to recent hip surgery) Modified Rankin: Slight disability    Exercises Total Joint Exercises Ankle Circles/Pumps: AROM;Both;10 reps;Seated Quad Sets: AROM;Both;10 reps;Seated Gluteal Sets: AROM;Both;10 reps;Seated Long Arc Quad: AROM;Left;10 reps;Seated   PT Diagnosis: Difficulty walking;Acute pain  PT Problem List: Decreased activity tolerance;Decreased mobility PT Treatment Interventions:       PT Goals(Current goals can be found in the care plan section) Acute Rehab PT Goals Patient Stated Goal: "want to know what has happened." "go home soon and put on some makeup" PT Goal Formulation: No goals set, d/c therapy  Visit Information  Last PT Received On: 08/15/13 Assistance Needed: +1 History of Present Illness: 57 yo female admitted with nausea dizziness and diplopia. 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 Pt recent d/c with Lt THA direct anterior. Pt presents with ptosis.       Prior Functioning  Home Living Family/patient expects to be discharged to:: Private residence Living Arrangements: Spouse/significant other Available Help at Discharge: Family;Available PRN/intermittently Type of Home: House Home Access: Stairs to enter Entrance Stairs-Number of Steps: 12 Home Layout: Two level;Able to live on main level with bedroom/bathroom Home Equipment: Dan Humphreys - 2 wheels;Cane - single point;Bedside commode Prior Function Level of Independence: Independent Comments: ambulated with RW since hip surgery last week Communication Communication: No difficulties Dominant  Hand: Right    Cognition  Cognition Arousal/Alertness: Awake/alert Behavior  During Therapy: WFL for tasks assessed/performed Overall Cognitive Status: Within Functional Limits for tasks assessed    Extremity/Trunk Assessment Upper Extremity Assessment Upper Extremity Assessment: Overall WFL for tasks assessed Lower Extremity Assessment Lower Extremity Assessment: Overall WFL for tasks assessed;LLE deficits/detail LLE Deficits / Details: LLE s/p anterior THR.  Did not full muscle test.  At least 3/5 throughout. Cervical / Trunk Assessment Cervical / Trunk Assessment: Normal   Balance Balance Balance Assessed:  (no apparent balance deficits noted)  End of Session PT - End of Session Equipment Utilized During Treatment: Gait belt Activity Tolerance: Patient tolerated treatment well Patient left: in chair;with call bell/phone within reach Nurse Communication: Mobility status  GP     Olivia Canter, [place] [phone] 08/15/2013, 11:11 AM

## 2013-08-15 NOTE — Progress Notes (Signed)
Occupational Therapy Treatment Patient Details Name: Melissa Grimes MRN: [id number] DOB: [birth year] Today's Date: 08/15/2013 Time: 4098-1191 OT Time Calculation (min): 43 min  OT Assessment / Plan / Recommendation  History of present illness 57 yo female admitted with nausea dizziness and diplopia. 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 Pt recent d/c with Lt THA direct anterior. Pt presents with ptosis.   OT comments  Pt encouraged to follow up with outpatient OT upon initial d/c home. 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 Pt provided HEP and with good return demo. OT to continue to follow acutely.  Follow Up Recommendations  Outpatient OT    Barriers to Discharge       Equipment Recommendations  None recommended by OT    Recommendations for Other Services    Frequency Min 2X/week   Progress towards OT Goals Progress towards OT goals: Progressing toward goals  Plan Discharge plan remains appropriate    Precautions / Restrictions Precautions Precautions: None Precaution Comments: direct Anterior hip    Pertinent Vitals/Pain Reports tingling/ numbness in left knee to ankle area Pt states "i have had this nerve change for a long time"    ADL  ADL Comments: Session focused on vision assessment: Pt noted to have deficits with abduction of Rt eye ( lateral rectus muscle". pt provided a pair of glasses and eye patch removed. Pt with central vision occluded with clear tape. Tape placed in central vision to stop diplopia. Pt reports being able to see single. Pt educated on 4 exercises for HEP. Pt encouraged to follow up with neuro outpatient at this time and then progress to [facility] eye center if needed. Pt cancelling appointment at this time.    OT Diagnosis:    OT Problem List:   OT Treatment Interventions:     OT Goals(current goals can now be found in the care plan  section) Acute Rehab OT Goals Patient Stated Goal: "want to know what has happened." "go home soon and put on some makeup" OT Goal Formulation: With patient Time For Goal Achievement: 08/29/13 Potential to Achieve Goals: Good ADL Goals Additional ADL Goal #1: Pt will complete  vision HEP MOD I  Visit Information  Last OT Received On: 08/15/13 Assistance Needed: +1 History of Present Illness: 57 yo female admitted with nausea dizziness and diplopia. 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 Pt recent d/c with Lt THA direct anterior. Pt presents with ptosis.    Subjective Data      Prior Functioning       Cognition  Cognition Arousal/Alertness: Awake/alert Behavior During Therapy: WFL for tasks assessed/performed Overall Cognitive Status: Within Functional Limits for tasks assessed    Mobility       Exercises  Other Exercises Other Exercises: pt provided eye movements in all plans  Other Exercises: Pt provided head movement with objects stable and second exercise with head still and object movementing Other Exercises: pt working on smooth pursuits in all plans Other Exercises: Pt provided saccade exercise using two straws   Balance     End of Session OT - End of Session Activity Tolerance: Patient tolerated treatment well Patient left: in bed;with call bell/phone within reach  GO     Harolyn Rutherford 08/15/2013, 3:54 PM Pager: [phone]

## 2013-08-15 NOTE — Evaluation (Signed)
Occupational Therapy Evaluation Patient Details Name: Melissa Grimes MRN: [id number] DOB: [birth year] Today's Date: 08/15/2013 Time: 8469-6295 OT Time Calculation (min): 39 min  OT Assessment / Plan / Recommendation History of present illness 57 yo female admitted with nausea dizziness and diplopia. 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 Pt recent d/c with Lt THA direct anterior. Pt presents with ptosis.   Clinical Impression   PT admitted with nausea dizziness and diplopia. Pt currently with functional limitiations due to the deficits listed below (see OT problem list).  Pt will benefit from skilled OT to increase their independence and safety with adls and balance to allow discharge outpatient. Pt with new onset of visual deficits that will affect ADLS and IADLS upon d/c. Pt provided handout and encouraged to follow up at [facility] eye center with Dr Karleen Hampshire. Pt also concerned with driving and provided drivers rehab handout to [facility] rehab center. Pt anxious to be provided education of MRI results this AM.    OT Assessment  Patient needs continued OT Services    Follow Up Recommendations  Outpatient OT    Barriers to Discharge      Equipment Recommendations  None recommended by OT    Recommendations for Other Services    Frequency  Min 2X/week    Precautions / Restrictions Precautions Precautions: None Precaution Comments: Anterior hip  Restrictions LLE Weight Bearing: Weight bearing as tolerated   Pertinent Vitals/Pain No pain Fatigue of eyes    ADL  Eating/Feeding: Independent Where Assessed - Eating/Feeding: Bed level Grooming: Teeth care;Wash/dry face;Supervision/safety Where Assessed - Grooming: Unsupported standing Upper Body Bathing: Simulated;Chest;Right arm;Left arm;Abdomen;Supervision/safety Where Assessed - Upper Body Bathing: Unsupported sit to stand Lower Body Bathing: Supervision/safety Where  Assessed - Lower Body Bathing: Unsupported sit to stand Toilet Transfer: Supervision/safety Toilet Transfer Method: Sit to Barista: Regular height toilet;Grab bars Toileting - Clothing Manipulation and Hygiene: Supervision/safety Where Assessed - Engineer, mining and Hygiene: Sit to stand from 3-in-1 or toilet Equipment Used: Gait belt;Rolling walker Transfers/Ambulation Related to ADLs: Pt ambulating with RW with excellent recall of positioning. Pt currenlty at Supervision level due to visual deficits. ADL Comments: Pt with noted visual deficits see below. Pt provided temporary gauze patch for Rt eye due to diplopia. Eye patched ordered and pending arrival to the floor. Pt educated on rotating eye patch every 2 hours, removal for sleeping/ napping and can have patch off for 1 hour at a time. Pt recommend to patch Rt eye for mobility. Pt verbalized instructions back to therapist. Pt without UE deficits.    OT Diagnosis: Disturbance of vision  OT Problem List: Decreased strength;Impaired balance (sitting and/or standing);Impaired vision/perception OT Treatment Interventions: Self-care/ADL training;Therapeutic exercise;Therapeutic activities;Visual/perceptual remediation/compensation;Balance training;Patient/family education   OT Goals(Current goals can be found in the care plan section) Acute Rehab OT Goals Patient Stated Goal: "want to know what has happened." "go home soon and put on some makeup" OT Goal Formulation: With patient Time For Goal Achievement: 08/29/13 Potential to Achieve Goals: Good  Visit Information  Last OT Received On: 08/15/13 Assistance Needed: +1 History of Present Illness: 57 yo female admitted with nausea dizziness and diplopia. 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 Pt recent d/c with Lt THA direct anterior. Pt presents with ptosis.       Prior Functioning      Home Living Family/patient expects to be discharged to:: Private residence  Living Arrangements: Spouse/significant other Available Help at Discharge: Family;Available PRN/intermittently Type of Home: House Home Access: Stairs to enter Entrance Stairs-Number of Steps: 12 Home Layout: Two level;Able to live on main level with bedroom/bathroom Home Equipment: Dan Humphreys - 2 wheels;Cane - single point;Bedside commode Prior Function Level of Independence: Independent Communication Communication: No difficulties Dominant Hand: Right         Vision/Perception Vision - History Baseline Vision: Wears contacts Patient Visual Report: Diplopia;Peripheral vision impairment Vision - Assessment Eye Alignment: Impaired (comment) Vision Assessment: Vision tested Ocular Range of Motion: Restricted on the right Tracking/Visual Pursuits: Right eye does not track laterally;Impaired - to be further tested in functional context Diplopia Assessment: Objects split on top of one another ("kinda like a shadow") Additional Comments: Pt noted to have Rt eye lateral AROM impairment. Pt verbalized this on Ot arrival. Pt with good self awareness. Pt also has determined if turning head to the right and using left peripherial vision can stop diplopia. Pt without diplopia with occlusion of one eye. Pt noted to have rotational nystagmus in left eye. Pt describes it as "the left one jumps"   Cognition  Cognition Arousal/Alertness: Awake/alert Behavior During Therapy: WFL for tasks assessed/performed Overall Cognitive Status: Within Functional Limits for tasks assessed    Extremity/Trunk Assessment Upper Extremity Assessment Upper Extremity Assessment: Overall WFL for tasks assessed Lower Extremity Assessment Lower Extremity Assessment: Defer to PT evaluation Cervical / Trunk Assessment Cervical / Trunk Assessment: Normal     Mobility Bed Mobility Bed Mobility: Supine to Sit;Sitting - Scoot to Edge of  Bed Supine to Sit: 6: Modified independent (Device/Increase time);HOB elevated Sitting - Scoot to Edge of Bed: 6: Modified independent (Device/Increase time) Details for Bed Mobility Assistance: pt needed incr time and given verbal cues for gaze stabilization to prevent dizziness with mobility Transfers Transfers: Sit to Stand;Stand to Sit Sit to Stand: 5: Supervision;With upper extremity assist;From bed Stand to Sit: 5: Supervision;With upper extremity assist;To chair/3-in-1     Exercise     Balance     End of Session OT - End of Session Activity Tolerance: Patient tolerated treatment well Patient left: in chair;with call bell/phone within reach  GO     Harolyn Rutherford 08/15/2013, 9:08 AM Pager: [phone]

## 2013-08-15 NOTE — Progress Notes (Signed)
Pt was discharged per MD order. Pt was alert and oriented at discharge with no complaints of pain. Pt and husband verbalized understanding of discharge teaching. Pt was given prescriptions.  Pt was taken down to private vehicle VIA wheelchair by RN. Ilean Skill, Caydin Yeatts R

## 2013-08-15 NOTE — Discharge Summary (Signed)
Physician Discharge Summary  Riyana Biel WUJ:[id number] DOB: 1956/02/19 DOA: 08/14/2013  PCP: Lemont Fillers., NP  Admit date: 08/14/2013 Discharge date: 08/15/2013  Time spent: 40 minutes  Recommendations for Outpatient Follow-up:  1. Follow up with primary MD. 2. Follow up with Dr Doneen Poisson, orthopedic surgeon, per prior appointment. 3. Follow up with Dr Delia Heady, neurologist.  4. Continue physiotherapy. Outpatient OT.   Discharge Diagnoses:  Active Problems:   Extraocular muscle weakness   Gaze palsy   Dizziness   Dyslipidemia   OA (osteoarthritis)   Discharge Condition: Satisfactory.  Diet recommendation: Heart-Healthy.   Filed Weights   08/14/13 1950  Weight: 73.755 kg (162 lb 9.6 oz)    History of present illness:  57 year old female, with known history of dyslipidemia, HTN, OA, s/p left THA 01/18/13, and left THA on 08/09/13. According to patient, she was discharged on 08/12/13, got home at about 6:00 PM, and took some Oxycodone for pain, and 30 minutes later, she had sudden onset dizziness, and a sensation of "spinning" followed by nausea and double vision. She vomited all night, and in AM of 08/13/13, her husband had to call the orthopedic office, and an antiemetic was prescribed. Although nausea has since improved, her visual symptoms and dizziness have persisted. Patient denies antecedent URI or fever, she has no dysarthria or limb weakness. Admitted for further evaluation and management.    Hospital Course:  1. Acute CVA: Patient presented with relatively sudden onset dizziness/verigo, nausea, as well as diplopia. Physical examination revealed nystagmus on left lateral gaze, a probable left partial ptosis and paralysis of right lateral gaze. Head CT scan was negative for acute findings, and 12-lead EKG showed normal SR. Differential diagnostic considerations included CVA, demyelination, with possible median longitudinal fasciculus lesion, as well as  myasthenia. Dr Thana Farr has provided neurology consultation, and patient was admitted for appropriate work up. Brain MRI revealed a small acute infarct in the dorsal right brainstem at the level of  the medial longitudinal fasciculus (MLF). No mass effect or hemorrhage, but otherwise normal brain parenchyma. MRA showed patent distal vertebral arteries, the left is dominant. The right PICA is not identified, but otherwise negative posterior circulation. Vascular duplex revealed 1-39% ICA stenosis and antegrade vertebral artery flow bilaterally. 2D echocardiogram showed normal LV cavity size, EF of 55% to 60% and no regional wall motion abnormalities. There was mild mitral regurgitation. Patient is on low dose ASA for secondary stroke prevention, and will follow up with Dr Delia Heady, on discharge. Per neurology team, if eye symptoms continue for 2 months, then would recommend seeing opthalmologist as corrective eyewear may be necessary at that point but not before, as the eyes may still go through changes that would preclude permanent eyewear. This has been communicated to patient.  2. Dyslipidemia: Pre-admission lipid-lowering medication have been continued. Lipid profile showed TC 141, TG 158, HDL 36, LDL 73. TSH is normal at 2.127.  3. HTN: Reasonable in context of acute CVA. Allowing permissive HTN.  4. OA (osteoarthritis): Patient is s/p bilateral total hip replacement, the latest (left hip) was on 08/09/13. Stable from this view point. Patient has follow up appointment with Dr Magnus Ivan on 08/24/13.    Procedures:  See Below.  2D Echocardiogram.  Vascular Duplex.   Consultations:  Dr Thana Farr, neurologist.   Discharge Exam: Filed Vitals:   08/15/13 1740  BP: 136/80  Pulse: 85  Temp: 98.5 F (36.9 C)  Resp: 18    General: Comfortable. Alert,  communicative, fully oriented, talking in complete sentences, not short of breath at rest.  HEENT: No clinical pallor, no  jaundice, no conjunctival injection or discharge. Hydration status is satisfactory.  NECK: Supple, JVP not seen, no carotid bruits, no palpable lymphadenopathy, no palpable goiter.  CHEST: Clinically clear to auscultation, no wheezes, no crackles.  HEART: Sounds 1 and 2 heard, normal, regular, no murmurs.  ABDOMEN: Full, soft, non-tender, no palpable organomegaly, no palpable masses, normal bowel sounds.  GENITALIA: Not examined.  LOWER EXTREMITIES: No pitting edema, palpable peripheral pulses.  MUSCULOSKELETAL SYSTEM: Surgical site left hip is unremarkable. The rest of the musculo-skeletal system examination is unremarkable.  CENTRAL NERVOUS SYSTEM: Patient has probable partial left ptosis, nystagmus on left lateral gaze, full range of left eye movement, but has a right gaze palsy of the right eye, with failure of right eye abduction. There features have shown some improvement overnight. No other focal neurologic deficit is elicited.   Discharge Instructions      Discharge Orders   Future Appointments Provider Department Dept Phone   01/10/2014 10:30 AM Sandford Craze, NP [facility] at  [facility] [phone]   Future Orders Complete By Expires   Diet - low sodium heart healthy  As directed    Increase activity slowly  As directed        Medication List    STOP taking these medications       lisinopril 20 MG tablet  Commonly known as:  PRINIVIL,ZESTRIL     promethazine 25 MG tablet  Commonly known as:  PHENERGAN      TAKE these medications       Acidophilus 100 MG Caps  Take 100 mg by mouth daily.     aspirin 325 MG EC tablet  Take 1 tablet (325 mg total) by mouth daily with breakfast.     calcium citrate-vitamin D 500-400 MG-UNIT chewable tablet  Chew 2 tablets by mouth 2 (two) times daily.     celecoxib 200 MG capsule  Commonly known as:  CELEBREX  Take 1 capsule (200 mg total) by mouth daily.     diphenhydrAMINE 12.5 MG/5ML liquid  Commonly known as:   BENADRYL  Take 25 mg by mouth daily as needed (for dry heaving).     estradiol-norethindrone 1-0.5 MG per tablet  Commonly known as:  MIMVEY  Take 1 tablet by mouth daily.     Fish Oil 1200 MG Caps  Take 1 capsule by mouth daily.     gabapentin 300 MG capsule  Commonly known as:  NEURONTIN  Take 1 capsule (300 mg total) by mouth at bedtime.     HYDROcodone-acetaminophen 5-325 MG per tablet  Commonly known as:  NORCO/VICODIN  Take 1-2 tablets by mouth every 6 (six) hours as needed for pain.     methocarbamol 500 MG tablet  Commonly known as:  ROBAXIN  Take 500 mg by mouth every 6 (six) hours as needed (for muscle spasms).     multivitamin with minerals Tabs tablet  Take 1 tablet by mouth daily. Centrum Silver     pravastatin 20 MG tablet  Commonly known as:  PRAVACHOL  Take 1 tablet (20 mg total) by mouth daily.     senna-docusate 8.6-50 MG per tablet  Commonly known as:  Senokot-S  Take 1 tablet by mouth at bedtime.     UNABLE TO FIND  - Out patient occupational therapy.  - Dx: Diplopia due to CVA.       Allergies  Allergen Reactions  . Achromycin [Tetracycline] Hives   Follow-up Information   Schedule an appointment as soon as possible for a visit with Lemont Fillers., NP.   Specialty:  Internal Medicine   Contact information:   [address] [place] [address] [phone]       Follow up with Kathryne Hitch, MD. (Keep prior appointment.)    Specialty:  Orthopedic Surgery   Contact information:   [address] [place] [address] [phone]       Follow up with Gates Rigg, MD. Schedule an appointment as soon as possible for a visit in 2 months.   Specialties:  Neurology, Radiology   Contact information:   [address] [place] [address] [phone]        The results of significant diagnostics from this hospitalization (including imaging, microbiology, ancillary and laboratory) are  listed below for reference.    Significant Diagnostic Studies: Dg Chest 2 View  08/14/2013   CLINICAL DATA:  Hypertension. CVA.  EXAM: CHEST  2 VIEW  COMPARISON:  12/22/2012.  FINDINGS: The cardiac silhouette, mediastinal and hilar contours are normal and stable. The lungs are clear. No pleural effusion. The bony thorax is intact.  IMPRESSION: No acute cardiopulmonary findings.   Electronically Signed   By: Loralie Champagne M.D.   On: 08/14/2013 18:57   Dg Hip Operative Left  08/09/2013   *RADIOLOGY REPORT*  Clinical Data: Osteoarthritis  OPERATIVE LEFT HIP  Technique:  C-arm fluoroscopic images were obtained intraoperatively and submitted for postoperative interpretation. Please see the performing provider's procedural report for the fluoroscopy time utilized.  Comparison: None.  Findings: C-arm radiographs demonstrate satisfactory position alignment status post left T H R.  IMPRESSION: As above   Original Report Authenticated By: Davonna Belling, M.D.   Ct Head (brain) Wo Contrast  08/14/2013   CLINICAL DATA:  Dizziness and nausea. Left facial droop. Recently postop from hip surgery.  EXAM: CT HEAD WITHOUT CONTRAST  TECHNIQUE: Contiguous axial images were obtained from the base of the skull through the vertex without intravenous contrast.  COMPARISON:  None.  FINDINGS: No evidence of intracranial hemorrhage, brain edema, or other signs of acute infarction. No evidence of intracranial mass lesion or mass effect. No abnormal extraaxial fluid collections identified. Ventricles are normal in size. No skull abnormality identified.  IMPRESSION: Negative noncontrast head CT.   Electronically Signed   By: Myles Rosenthal   On: 08/14/2013 16:02   Mr Maxine Glenn Head Wo Contrast  08/14/2013   CLINICAL DATA:  57 year old female status post recent hip replacement with acute onset of visual changes, diplopia, on equal pupils. Initial encounter.  EXAM: MRI HEAD WITHOUT CONTRAST  MRA HEAD WITHOUT CONTRAST  TECHNIQUE: Multiplanar,  multiecho pulse sequences of the brain and surrounding structures were obtained without intravenous contrast. Angiographic images of the head were obtained using MRA technique without contrast.  COMPARISON:  Head CT without contrast 08/14/2013.  FINDINGS: MRI HEAD FINDINGS  Diffusion weighted imaging positive for a small linear focus of restriction along the dorsal brainstem at the ventral 4th ventricle to the right of midline; right medial longitudinal fasciculus (MLF) region.  Associated T2 and FLAIR hyperintensity. No mass effect or hemorrhage.  No other restricted diffusion. No midline shift, mass effect, evidence of mass lesion, ventriculomegaly, extra-axial collection or acute intracranial hemorrhage. Cervicomedullary junction and pituitary are within normal limits. Mild spondylolisthesis in the visualized upper cervical spine. Major intracranial vascular flow voids are preserved.  Outside of  the acute finding, gray and white matter signal is within normal limits throughout the brain.  Visualized bone marrow signal is within normal limits. Mildly dysconjugate gaze, otherwise negative orbits soft tissues. Small right maxillary sinus mucous retention cyst. Other visualized paranasal sinuses and mastoids are clear. Negative scalp soft tissues.  MRA HEAD FINDINGS  Antegrade flow in the posterior circulation. These images demonstrate that the distal left vertebral artery in fact is dominant and is not terminate in PICA, but is tortuous extending across the midline. No distal vertebral artery stenosis. Patent left PICA. Right PICA is not identified.  Vertebrobasilar junction is patent. No basilar artery stenosis. SCA and left PCA origins are within normal limits. Fetal right PCA origin. Bilateral PCA branches are within normal limits. Left posterior communicating artery is diminutive.  Antegrade flow in both ICA siphons. No ICA stenosis. Ophthalmic and posterior communicating artery origins are within normal limits.  Normal carotid termini, MCA and ACA origins. Diminutive anterior communicating artery. Visualized bilateral ACA and MCA branches are within normal limits.  IMPRESSION: MRI HEAD IMPRESSION  1. Small acute infarct in the dorsal right brainstem at the level of the medial longitudinal fasciculus (MLF). No mass effect or hemorrhage.  2.  Otherwise normal brain parenchyma.  MRA HEAD IMPRESSION  1. Patent distal vertebral arteries, the left is dominant. The right PICA is not identified, but otherwise negative posterior circulation.  2. Otherwise negative intracranial MRA.   Electronically Signed   By: Augusto Gamble M.D.   On: 08/14/2013 19:35   Mr Brain Wo Contrast  08/14/2013   CLINICAL DATA:  57 year old female status post recent hip replacement with acute onset of visual changes, diplopia, on equal pupils. Initial encounter.  EXAM: MRI HEAD WITHOUT CONTRAST  MRA HEAD WITHOUT CONTRAST  TECHNIQUE: Multiplanar, multiecho pulse sequences of the brain and surrounding structures were obtained without intravenous contrast. Angiographic images of the head were obtained using MRA technique without contrast.  COMPARISON:  Head CT without contrast 08/14/2013.  FINDINGS: MRI HEAD FINDINGS  Diffusion weighted imaging positive for a small linear focus of restriction along the dorsal brainstem at the ventral 4th ventricle to the right of midline; right medial longitudinal fasciculus (MLF) region.  Associated T2 and FLAIR hyperintensity. No mass effect or hemorrhage.  No other restricted diffusion. No midline shift, mass effect, evidence of mass lesion, ventriculomegaly, extra-axial collection or acute intracranial hemorrhage. Cervicomedullary junction and pituitary are within normal limits. Mild spondylolisthesis in the visualized upper cervical spine. Major intracranial vascular flow voids are preserved.  Outside of the acute finding, gray and white matter signal is within normal limits throughout the brain.  Visualized bone marrow  signal is within normal limits. Mildly dysconjugate gaze, otherwise negative orbits soft tissues. Small right maxillary sinus mucous retention cyst. Other visualized paranasal sinuses and mastoids are clear. Negative scalp soft tissues.  MRA HEAD FINDINGS  Antegrade flow in the posterior circulation. These images demonstrate that the distal left vertebral artery in fact is dominant and is not terminate in PICA, but is tortuous extending across the midline. No distal vertebral artery stenosis. Patent left PICA. Right PICA is not identified.  Vertebrobasilar junction is patent. No basilar artery stenosis. SCA and left PCA origins are within normal limits. Fetal right PCA origin. Bilateral PCA branches are within normal limits. Left posterior communicating artery is diminutive.  Antegrade flow in both ICA siphons. No ICA stenosis. Ophthalmic and posterior communicating artery origins are within normal limits. Normal carotid termini, MCA and ACA origins.  Diminutive anterior communicating artery. Visualized bilateral ACA and MCA branches are within normal limits.  IMPRESSION: MRI HEAD IMPRESSION  1. Small acute infarct in the dorsal right brainstem at the level of the medial longitudinal fasciculus (MLF). No mass effect or hemorrhage.  2.  Otherwise normal brain parenchyma.  MRA HEAD IMPRESSION  1. Patent distal vertebral arteries, the left is dominant. The right PICA is not identified, but otherwise negative posterior circulation.  2. Otherwise negative intracranial MRA.   Electronically Signed   By: Augusto Gamble M.D.   On: 08/14/2013 19:35   Dg Pelvis Portable  08/09/2013   CLINICAL DATA:  Postop left hip replacement  EXAM: PORTABLE PELVIS  COMPARISON:  Portable pelvis of 01/18/2013  FINDINGS: A left total hip replacement is now present. The only questionable abnormality is a faint linear lucency overlying the medullary portion of the mid proximal left femoral shaft. This most likely is normal but attention to this  area on follow-up plain films is recommended. Right total hip replacement also is noted.  IMPRESSION: Left total hip replacement. Faint lucency in mid proximal left femoral shaft of questionable significance. Recommend followup.   Electronically Signed   By: Dwyane Dee M.D.   On: 08/09/2013 16:44   Dg Hip Portable 1 View Left  08/09/2013   CLINICAL DATA:  Hip surgery  EXAM: PORTABLE LEFT HIP - 1 VIEW  COMPARISON:  08/09/2013  FINDINGS: Image demonstrates a total hip arthroplasty.  IMPRESSION: Total hip arthroplasty.   Electronically Signed   By: Maryclare Bean M.D.   On: 08/09/2013 17:14    Microbiology: No results found for this or any previous visit (from the past 240 hour(s)).   Labs: Basic Metabolic Panel:  Recent Labs Lab 08/10/13 0840 08/14/13 1504 08/14/13 1528 08/15/13 0500  NA 136 139 141 138  K 3.4* 3.7 3.9 3.6  CL 103 102 102 102  CO2 26 26  --  28  GLUCOSE 111* 95 96 97  BUN 8 12 15 11   CREATININE 0.62 0.53 0.70 0.62  CALCIUM 8.3* 8.6  --  8.5   Liver Function Tests:  Recent Labs Lab 08/14/13 1504 08/15/13 0500  AST 23 16  ALT 17 13  ALKPHOS 53 44  BILITOT 0.2* 0.2*  PROT 7.0 6.0  ALBUMIN 3.2* 2.7*   No results found for this basename: LIPASE, AMYLASE,  in the last 168 hours No results found for this basename: AMMONIA,  in the last 168 hours CBC:  Recent Labs Lab 08/10/13 0840 08/11/13 0955 08/12/13 0745 08/14/13 1504 08/14/13 1528 08/15/13 0500  WBC 9.3 6.9 7.4 7.6  --  5.8  NEUTROABS  --   --   --  4.9  --   --   HGB 11.8* 11.1* 11.3* 12.6 12.9 11.3*  HCT 34.9* 33.0* 33.7* 37.0 38.0 32.7*  MCV 95.4 95.1 96.0 94.6  --  93.4  PLT 267 241 258 375  --  348   Cardiac Enzymes:  Recent Labs Lab 08/14/13 1504  TROPONINI <0.30   BNP: BNP (last 3 results) No results found for this basename: PROBNP,  in the last 8760 hours CBG:  Recent Labs Lab 08/14/13 1442  GLUCAP 143*       Signed:  Monty Mccarrell,CHRISTOPHER  [facility] 08/15/2013,  6:45 PM

## 2013-08-15 NOTE — Progress Notes (Signed)
[facility] PROGRESS NOTE  Melissa Grimes AVW:[id number] DOB: 07/05/1956 DOA: 08/14/2013 PCP: Lemont Fillers., NP  Assessment/Plan: Active Problems:   Extraocular muscle weakness   Gaze palsy   Dizziness   Dyslipidemia   OA (osteoarthritis)    1. Acute CVA: Patient presented with relatively sudden onset dizziness/verigo, nausea, as well as diplopia. Physical examination revealed nystagmus on left lateral gaze, a probable left partial ptosis and paralysis of right lateral gaze. Head CT scan was negative for acute findings, and 12-lead EKG showed normal SR. Differential diagnostic considerations included CVA, demyelination, with possible median longitudinal fasciculus lesion, as well as myasthenia. Dr Thana Farr has provided neurology consultation, and patient was admitted for appropriate work up. Brain MRI revealed a small acute infarct in the dorsal right brainstem at the level of  the medial longitudinal fasciculus (MLF). No mass effect or hemorrhage, but otherwise normal brain parenchyma. MRA showed patent distal vertebral arteries, the left is dominant. The right PICA is not identified, but otherwise negative posterior circulation. Vascular duplex and 2D echocardiogram are pending. On low dose ASA.  2. Dyslipidemia: Pre-admission lipid-lowering medication have been continued. Lipid profile showed TC 141, TG 158, HDL 36, LDL 73.  TSH is normal at 2.127.  3. HTN: Reasonable in context of acute CVA. Allowing permissive HTN.  4. OA (osteoarthritis): Patient is s/p bilateral total hip replacement, the latest (left hip) was on 08/09/13. Stable from this view point. Patient has follow up appointment with Dr Magnus Ivan on 08/24/13.    Code Status: Full Code.  Family Communication:  Disposition Plan: To be determined.    Brief narrative: 57 year old female, with known history of dyslipidemia, HTN, OA, s/p left THA 01/18/13, and left THA on 08/09/13. According to patient, she was  discharged on 08/12/13, got home at about 6:00 PM, and took some Oxycodone for pain, and 30 minutes later, she had sudden onset dizziness, and a sensation of "spinning" followed by nausea and double vision. She vomited all night, and in AM of 08/13/13, her husband had to call the orthopedic office, and an antiemetic was prescribed. Although nausea has since improved, her visual symptoms and dizziness have persisted. Patient denies antecedent URI or fever, she has no dysarthria or limb weakness. Admitted for further evaluation and management.    Consultants:  Dr Thana Farr, neurologist.   Procedures:  Head CT Scan.  Brain MRI/MRA.   CXR.   Antibiotics:  N/A.   HPI/Subjective: No new issues. Left eye droop is better.   Objective: Vital signs in last 24 hours: Temp:  [98 F (36.7 C)-98.4 F (36.9 C)] 98 F (36.7 C) (09/29 0951) Pulse Rate:  [65-93] 93 (09/29 0951) Resp:  [16-22] 18 (09/29 0951) BP: (124-158)/(70-95) 157/95 mmHg (09/29 0951) SpO2:  [97 %-100 %] 100 % (09/29 0951) Weight:  [73.755 kg (162 lb 9.6 oz)] 73.755 kg (162 lb 9.6 oz) (09/28 1950) Weight change:     Intake/Output from previous day:       Physical Exam: General: Comfortable. Alert, communicative, fully oriented, talking in complete sentences, not short of breath at rest.  HEENT: No clinical pallor, no jaundice, no conjunctival injection or discharge. Hydration status is satisfactory.  NECK: Supple, JVP not seen, no carotid bruits, no palpable lymphadenopathy, no palpable goiter.  CHEST: Clinically clear to auscultation, no wheezes, no crackles.  HEART: Sounds 1 and 2 heard, normal, regular, no murmurs.  ABDOMEN: Full, soft, non-tender, no palpable organomegaly, no palpable masses, normal bowel sounds.  GENITALIA: Not  examined.  LOWER EXTREMITIES: No pitting edema, palpable peripheral pulses.  MUSCULOSKELETAL SYSTEM: Surgical site left hip is unremarkable. The rest of the musculo-skeletal system  examination is unremarkable.  CENTRAL NERVOUS SYSTEM: Patient has probable partial left ptosis, nystagmus on left lateral gaze, full range of left eye movement, but has a right gaze palsy of the right eye, with failure of right eye abduction. There features have shown some improvement overnight. No other focal neurologic deficit is elicited.   Lab Results:  Recent Labs  08/14/13 1504 08/14/13 1528 08/15/13 0500  WBC 7.6  --  5.8  HGB 12.6 12.9 11.3*  HCT 37.0 38.0 32.7*  PLT 375  --  348    Recent Labs  08/14/13 1504 08/14/13 1528 08/15/13 0500  NA 139 141 138  K 3.7 3.9 3.6  CL 102 102 102  CO2 26  --  28  GLUCOSE 95 96 97  BUN 12 15 11   CREATININE 0.53 0.70 0.62  CALCIUM 8.6  --  8.5   No results found for this or any previous visit (from the past 240 hour(s)).   Studies/Results: Dg Chest 2 View  08/14/2013   CLINICAL DATA:  Hypertension. CVA.  EXAM: CHEST  2 VIEW  COMPARISON:  12/22/2012.  FINDINGS: The cardiac silhouette, mediastinal and hilar contours are normal and stable. The lungs are clear. No pleural effusion. The bony thorax is intact.  IMPRESSION: No acute cardiopulmonary findings.   Electronically Signed   By: Loralie Champagne M.D.   On: 08/14/2013 18:57   Ct Head (brain) Wo Contrast  08/14/2013   CLINICAL DATA:  Dizziness and nausea. Left facial droop. Recently postop from hip surgery.  EXAM: CT HEAD WITHOUT CONTRAST  TECHNIQUE: Contiguous axial images were obtained from the base of the skull through the vertex without intravenous contrast.  COMPARISON:  None.  FINDINGS: No evidence of intracranial hemorrhage, brain edema, or other signs of acute infarction. No evidence of intracranial mass lesion or mass effect. No abnormal extraaxial fluid collections identified. Ventricles are normal in size. No skull abnormality identified.  IMPRESSION: Negative noncontrast head CT.   Electronically Signed   By: Myles Rosenthal   On: 08/14/2013 16:02   Mr Maxine Glenn Head Wo  Contrast  08/14/2013   CLINICAL DATA:  57 year old female status post recent hip replacement with acute onset of visual changes, diplopia, on equal pupils. Initial encounter.  EXAM: MRI HEAD WITHOUT CONTRAST  MRA HEAD WITHOUT CONTRAST  TECHNIQUE: Multiplanar, multiecho pulse sequences of the brain and surrounding structures were obtained without intravenous contrast. Angiographic images of the head were obtained using MRA technique without contrast.  COMPARISON:  Head CT without contrast 08/14/2013.  FINDINGS: MRI HEAD FINDINGS  Diffusion weighted imaging positive for a small linear focus of restriction along the dorsal brainstem at the ventral 4th ventricle to the right of midline; right medial longitudinal fasciculus (MLF) region.  Associated T2 and FLAIR hyperintensity. No mass effect or hemorrhage.  No other restricted diffusion. No midline shift, mass effect, evidence of mass lesion, ventriculomegaly, extra-axial collection or acute intracranial hemorrhage. Cervicomedullary junction and pituitary are within normal limits. Mild spondylolisthesis in the visualized upper cervical spine. Major intracranial vascular flow voids are preserved.  Outside of the acute finding, gray and white matter signal is within normal limits throughout the brain.  Visualized bone marrow signal is within normal limits. Mildly dysconjugate gaze, otherwise negative orbits soft tissues. Small right maxillary sinus mucous retention cyst. Other visualized paranasal sinuses and mastoids are  clear. Negative scalp soft tissues.  MRA HEAD FINDINGS  Antegrade flow in the posterior circulation. These images demonstrate that the distal left vertebral artery in fact is dominant and is not terminate in PICA, but is tortuous extending across the midline. No distal vertebral artery stenosis. Patent left PICA. Right PICA is not identified.  Vertebrobasilar junction is patent. No basilar artery stenosis. SCA and left PCA origins are within normal  limits. Fetal right PCA origin. Bilateral PCA branches are within normal limits. Left posterior communicating artery is diminutive.  Antegrade flow in both ICA siphons. No ICA stenosis. Ophthalmic and posterior communicating artery origins are within normal limits. Normal carotid termini, MCA and ACA origins. Diminutive anterior communicating artery. Visualized bilateral ACA and MCA branches are within normal limits.  IMPRESSION: MRI HEAD IMPRESSION  1. Small acute infarct in the dorsal right brainstem at the level of the medial longitudinal fasciculus (MLF). No mass effect or hemorrhage.  2.  Otherwise normal brain parenchyma.  MRA HEAD IMPRESSION  1. Patent distal vertebral arteries, the left is dominant. The right PICA is not identified, but otherwise negative posterior circulation.  2. Otherwise negative intracranial MRA.   Electronically Signed   By: Augusto Gamble M.D.   On: 08/14/2013 19:35   Mr Brain Wo Contrast  08/14/2013   CLINICAL DATA:  57 year old female status post recent hip replacement with acute onset of visual changes, diplopia, on equal pupils. Initial encounter.  EXAM: MRI HEAD WITHOUT CONTRAST  MRA HEAD WITHOUT CONTRAST  TECHNIQUE: Multiplanar, multiecho pulse sequences of the brain and surrounding structures were obtained without intravenous contrast. Angiographic images of the head were obtained using MRA technique without contrast.  COMPARISON:  Head CT without contrast 08/14/2013.  FINDINGS: MRI HEAD FINDINGS  Diffusion weighted imaging positive for a small linear focus of restriction along the dorsal brainstem at the ventral 4th ventricle to the right of midline; right medial longitudinal fasciculus (MLF) region.  Associated T2 and FLAIR hyperintensity. No mass effect or hemorrhage.  No other restricted diffusion. No midline shift, mass effect, evidence of mass lesion, ventriculomegaly, extra-axial collection or acute intracranial hemorrhage. Cervicomedullary junction and pituitary are  within normal limits. Mild spondylolisthesis in the visualized upper cervical spine. Major intracranial vascular flow voids are preserved.  Outside of the acute finding, gray and white matter signal is within normal limits throughout the brain.  Visualized bone marrow signal is within normal limits. Mildly dysconjugate gaze, otherwise negative orbits soft tissues. Small right maxillary sinus mucous retention cyst. Other visualized paranasal sinuses and mastoids are clear. Negative scalp soft tissues.  MRA HEAD FINDINGS  Antegrade flow in the posterior circulation. These images demonstrate that the distal left vertebral artery in fact is dominant and is not terminate in PICA, but is tortuous extending across the midline. No distal vertebral artery stenosis. Patent left PICA. Right PICA is not identified.  Vertebrobasilar junction is patent. No basilar artery stenosis. SCA and left PCA origins are within normal limits. Fetal right PCA origin. Bilateral PCA branches are within normal limits. Left posterior communicating artery is diminutive.  Antegrade flow in both ICA siphons. No ICA stenosis. Ophthalmic and posterior communicating artery origins are within normal limits. Normal carotid termini, MCA and ACA origins. Diminutive anterior communicating artery. Visualized bilateral ACA and MCA branches are within normal limits.  IMPRESSION: MRI HEAD IMPRESSION  1. Small acute infarct in the dorsal right brainstem at the level of the medial longitudinal fasciculus (MLF). No mass effect or hemorrhage.  2.  Otherwise normal brain parenchyma.  MRA HEAD IMPRESSION  1. Patent distal vertebral arteries, the left is dominant. The right PICA is not identified, but otherwise negative posterior circulation.  2. Otherwise negative intracranial MRA.   Electronically Signed   By: Augusto Gamble M.D.   On: 08/14/2013 19:35    Medications: Scheduled Meds: . acidophilus  1 capsule Oral Daily  . aspirin EC  325 mg Oral Q breakfast  .  calcium-vitamin D  2 tablet Oral BID  . celecoxib  200 mg Oral Daily  . estradiol-norethindrone  1 tablet Oral Daily  . gabapentin  300 mg Oral QHS  . heparin  5,000 Units Subcutaneous Q8H  . multivitamin with minerals  1 tablet Oral Daily  . omega-3 acid ethyl esters  1 g Oral Daily  . senna-docusate  1 tablet Oral QHS  . simvastatin  10 mg Oral q1800   Continuous Infusions:  PRN Meds:.acetaminophen, HYDROcodone-acetaminophen, methocarbamol    LOS: 1 day   Melissa Grimes,CHRISTOPHER  [facility] Pager [phone]. If 8PM-8AM, please contact night-coverage at [email or web address], password [facility] 08/15/2013, 9:58 AM  LOS: 1 day

## 2013-08-15 NOTE — Progress Notes (Signed)
Stroke Team Progress Note  HISTORY Melissa Grimes is an 57 y.o. female who reports that she was released from the hospital on 9/26 after hip surgery. At 6pm that evening she noticed a change in vision. She had taken some pain medication previously. She then became nauseous and had vomiting. She felt as if the room was spinning and this was worse when she attempted to stand. She had nausea and vomiting all evening that was felt to be secondary to the pain medication. When she awakened on Saturday she still had the nausea. She did get a prescription for the nausea which helped but her vision issues remained. She was seen by PT who felt she had a facial droop and her husband felt she as having difficulty focusing (her eyes were not facing the same direction). She slept well last evening but with no improvement they presented for evaluation today.   Date last known well: Date: 08/12/2013  Time last known well: Time: 18:00  tPA Given: No: Outside time window   She was admitted for further evaluation and treatment.  SUBJECTIVE  She is sitting at the bedside, right eye patched.  Overall she feels her condition is rapidly improving.   OBJECTIVE Most recent Vital Signs: Filed Vitals:   08/15/13 0300 08/15/13 0529 08/15/13 0951 08/15/13 1400  BP: 130/75 125/84 157/95 143/89  Pulse: 65 74 93 73  Temp: 98.4 F (36.9 C) 98.3 F (36.8 C) 98 F (36.7 C) 98.5 F (36.9 C)  TempSrc: Oral Oral Oral Oral  Resp: 18 18 18 18   Height:      Weight:      SpO2: 98% 99% 100% 98%   CBG (last 3)   Recent Labs  08/14/13 1442  GLUCAP 143*    IV Fluid Intake:     MEDICATIONS  . acidophilus  1 capsule Oral Daily  . aspirin EC  325 mg Oral Q breakfast  . calcium-vitamin D  2 tablet Oral BID  . celecoxib  200 mg Oral Daily  . estradiol-norethindrone  1 tablet Oral Daily  . gabapentin  300 mg Oral QHS  . heparin  5,000 Units Subcutaneous Q8H  . multivitamin with minerals  1 tablet Oral Daily  . omega-3  acid ethyl esters  1 g Oral Daily  . senna-docusate  1 tablet Oral QHS  . simvastatin  10 mg Oral q1800   PRN:  acetaminophen, HYDROcodone-acetaminophen, methocarbamol  Diet:  General thin liquids Activity:  Activity as tolerated DVT Prophylaxis:  Heparin SQ Q8hours  CLINICALLY SIGNIFICANT STUDIES Basic Metabolic Panel:  Recent Labs Lab 08/14/13 1504 08/14/13 1528 08/15/13 0500  NA 139 141 138  K 3.7 3.9 3.6  CL 102 102 102  CO2 26  --  28  GLUCOSE 95 96 97  BUN 12 15 11   CREATININE 0.53 0.70 0.62  CALCIUM 8.6  --  8.5   Liver Function Tests:  Recent Labs Lab 08/14/13 1504 08/15/13 0500  AST 23 16  ALT 17 13  ALKPHOS 53 44  BILITOT 0.2* 0.2*  PROT 7.0 6.0  ALBUMIN 3.2* 2.7*   CBC:  Recent Labs Lab 08/14/13 1504 08/14/13 1528 08/15/13 0500  WBC 7.6  --  5.8  NEUTROABS 4.9  --   --   HGB 12.6 12.9 11.3*  HCT 37.0 38.0 32.7*  MCV 94.6  --  93.4  PLT 375  --  348   Coagulation:  Recent Labs Lab 08/14/13 1504  LABPROT 12.3  INR 0.93  Cardiac Enzymes:  Recent Labs Lab 08/14/13 1504  TROPONINI <0.30   Urinalysis:  Recent Labs Lab 08/14/13 1546  COLORURINE YELLOW  LABSPEC 1.018  PHURINE 6.0  GLUCOSEU NEGATIVE  HGBUR NEGATIVE  BILIRUBINUR NEGATIVE  KETONESUR NEGATIVE  PROTEINUR NEGATIVE  UROBILINOGEN 0.2  NITRITE NEGATIVE  LEUKOCYTESUR SMALL*   Lipid Panel    Component Value Date/Time   CHOL 141 08/15/2013 0500   TRIG 158* 08/15/2013 0500   HDL 36* 08/15/2013 0500   CHOLHDL 3.9 08/15/2013 0500   VLDL 32 08/15/2013 0500   LDLCALC 73 08/15/2013 0500   HgbA1C  Lab Results  Component Value Date   HGBA1C 5.3 08/15/2013    Urine Drug Screen:     Component Value Date/Time   LABOPIA POSITIVE* 08/14/2013 1546   COCAINSCRNUR NONE DETECTED 08/14/2013 1546   LABBENZ NONE DETECTED 08/14/2013 1546   AMPHETMU NONE DETECTED 08/14/2013 1546   THCU NONE DETECTED 08/14/2013 1546   LABBARB NONE DETECTED 08/14/2013 1546    Alcohol Level:  Recent  Labs Lab 08/14/13 1504  ETH <11    Dg Chest 2 View 08/14/2013  No acute cardiopulmonary findings  Ct Head (brain) Wo Contrast 08/14/2013 Negative noncontrast head CT.   Mr Maxine Glenn Head Wo Contrast 08/14/2013  1. Patent distal vertebral arteries, the left is dominant. The right PICA is not identified, but otherwise negative posterior circulation.  2. Otherwise negative intracranial MRA.     Mr Brain Wo Contrast 08/14/2013    1. Small acute infarct in the dorsal right brainstem at the level of the medial longitudinal fasciculus (MLF). No mass effect or hemorrhage.  2.  Otherwise normal brain parenchyma.  2D Echocardiogram    Carotid Doppler  1-39% ICA stenosis. Vertebral artery flow is antegrade.   EKG  normal sinus rhythm.   Therapy Recommendations home health   Physical Exam   Neurologic Examination:  Mental Status:  Alert, oriented, thought content appropriate. Speech fluent without evidence of aphasia. Able to follow 3 step commands without difficulty.  Cranial Nerves:  II: Discs flat bilaterally; Visual fields grossly normal, pupils equal, round, reactive to light and accommodation  III,IV, VI: mild left ptosis, extra-ocular motions intact in the right eye, patient unable to go beyond midline with the left eye. Remaining EOM's intact. Nystagmus on looking to the left  V,VII: decreased left NLF, facial light touch sensation normal bilaterally  VIII: hearing normal bilaterally  IX,X: gag reflex present  XI: bilateral shoulder shrug  XII: midline tongue extension  Motor:  Right : Upper extremity 5/5 Left: Upper extremity 5/5  Lower extremity 5/5 Lower extremity Limitation secondary to recent surgery  Tone and bulk:normal tone throughout; no atrophy noted  Sensory: Pinprick and light touch intact throughout, bilaterally  Deep Tendon Reflexes: 2+ and symmetric throughout  Plantars:  Right: downgoing Left: equivocal  Cerebellar:  normal finger-to-nose and normal heel-to-shin  testing. Not performed with LLE  Gait: unable to test  CV: pulses palpable throughout     ASSESSMENT Melissa Grimes is a 57 y.o. female presenting with nausea, vomiting, facial droop, diplopia. Imaging confirms a small acute infarct in the dorsal right brainstem at the level of the medial longitudinal fasciculus. Infarct felt to be secondary to small vessel disease.  Onaspirin 325 mg orally every day prior to admission. Now on aspirin 325 mg orally every day for secondary stroke prevention. Patient with resultant resolving symptoms with eyepatch. Work up underway.   Dizziness  Dyslipidemia, LDL 73, goal < 100 on statin  Osteoarthritis, s/p left hip surgery 1 week ago  Hypertension   Hospital day # 1  TREATMENT/PLAN  Continue aspirin 325 mg orally every day for secondary stroke prevention.  Echo pending  Eye patch as outlined by OT  If eye symptoms continue for 2 months, then would recommend seeing opthalmologist as corrective eyewear may be necessary at that point but not before as the eyes may still go through changes that would preclude permanent eyewear.  Otherwise, patient is instructed to follow up with Dr. Pearlean Brownie in 2 months.  Gwendolyn Lima. Manson Passey, [facility], MBA, MHA [facility] Pager: [phone] 08/15/2013 3:07 PM  I have personally obtained a history, examined the patient, evaluated imaging results, and formulated the assessment and plan of care. I agree with the above.

## 2013-08-15 NOTE — Progress Notes (Signed)
Echocardiogram completed.

## 2013-08-15 NOTE — Progress Notes (Signed)
Bilateral carotid artery duplex:  1-39% ICA stenosis.  Vertebral artery flow is antegrade.     

## 2013-08-15 NOTE — Progress Notes (Signed)
OT NOTE  Pt with eye patch to help with diplopia. Eye patch schedule: Rotate eye patch every 2 hours while patient is awake.After 4 hours ( eye patch on both eyes for 2 hours) pt is allowed a one hour break from the patch. Pt can remove eye patch for sleeping.   Mateo Flow   OTR/L Pager: [phone] Office: [phone] .

## 2013-08-16 ENCOUNTER — Telehealth: Payer: Self-pay | Admitting: Family

## 2013-08-16 LAB — URINE CULTURE
Colony Count: NO GROWTH
Culture: NO GROWTH

## 2013-08-16 NOTE — Telephone Encounter (Signed)
Please call patient and arrange a 1-2 week hospital follow up.

## 2013-08-16 NOTE — Telephone Encounter (Signed)
Patient returned phone call and scheduled appointment for 08/24/13

## 2013-08-16 NOTE — Telephone Encounter (Signed)
Left detailed message informing patient to call our office to schedule a post hospital follow up

## 2013-08-16 NOTE — Progress Notes (Signed)
Received call from [facility]. Pt called inqiring about OT. States she is unable to drive for outpt OT. States [facility] was active for HHPT and requesting OT to come out to home. Notified [facility] of pt's dc home with resumption of HH PT and to add HH OT. Spoke to [facility] rep and they will follow up with pt for [facility] PT/OT. NCM contacted pt and notified of [facility] coming out for [facility] services. Isidoro Donning RN CCM Case Mgmt phone [phone]

## 2013-08-17 LAB — STRIATED MUSCLE ANTIBODY: Striated Muscle Ab: <1:40 {titer}

## 2013-08-20 LAB — MYASTHENIA GRAVIS PANEL 2
Acety choline binding ab: <0.3 nmol/L
Acetylchol Block Ab: <15 % of inhibition (ref <15)
Acetylcholine Modulat Ab: 16 %

## 2013-08-24 ENCOUNTER — Encounter: Payer: Self-pay | Admitting: Family

## 2013-08-24 ENCOUNTER — Ambulatory Visit (INDEPENDENT_AMBULATORY_CARE_PROVIDER_SITE_OTHER): Payer: BC Managed Care – PPO | Admitting: Family

## 2013-08-24 VITALS — BP 134/98 | HR 73 | Temp 98.2°F | Resp 16 | Ht 64.0 in | Wt 157.1 lb

## 2013-08-24 DIAGNOSIS — M1610 Unilateral primary osteoarthritis, unspecified hip: Secondary | ICD-10-CM

## 2013-08-24 DIAGNOSIS — M169 Osteoarthritis of hip, unspecified: Secondary | ICD-10-CM

## 2013-08-24 DIAGNOSIS — I10 Essential (primary) hypertension: Secondary | ICD-10-CM

## 2013-08-24 DIAGNOSIS — I6350 Cerebral infarction due to unspecified occlusion or stenosis of unspecified cerebral artery: Secondary | ICD-10-CM

## 2013-08-24 DIAGNOSIS — E785 Hyperlipidemia, unspecified: Secondary | ICD-10-CM

## 2013-08-24 DIAGNOSIS — I639 Cerebral infarction, unspecified: Secondary | ICD-10-CM

## 2013-08-24 NOTE — Assessment & Plan Note (Signed)
LDL 73, continue statin.

## 2013-08-24 NOTE — Assessment & Plan Note (Signed)
Clinically stable except for ongoing gaze palsy and diplopia. Has upcoming follow up with neurology and with opthalmology.  We discussed importance of continuing aspirin, continued control of lipids and optimization of blood pressure to help prevent recurrent CVA.

## 2013-08-24 NOTE — Patient Instructions (Addendum)
Please keep your upcoming appointment with your eye doctor and neurology. Continue physical therapy. Continue aspirin. Restart lisinopril Follow up in 1 month. Sooner if problems/concerns.

## 2013-08-24 NOTE — Assessment & Plan Note (Signed)
Improved s/p THA left.  Some redness noted at incision, however, I think it is more likely due to irritation from staples. She is scheduled to see Dr. Magnus Ivan for staple removal today.

## 2013-08-24 NOTE — Assessment & Plan Note (Addendum)
BP Readings from Last 3 Encounters:  08/24/13 134/98  08/15/13 136/80  08/12/13 107/64   Reports BP last Friday 140/80. My repeat bp today in the office was 158/102.   Resume lisinopril 20 mg once daily.

## 2013-08-24 NOTE — Progress Notes (Signed)
Subjective:    Patient ID: Melissa Grimes, female    DOB: 05/17/1956, 57 y.o.   MRN: [id number]  HPI  Melissa Grimes is a 57 yr old female who presents today for hospital follow up. Records are reviewed. She underwent THA on 08/09/13.  She was discharged to home on 9/26. Took a dose of oxycodone.  Then apparently developed some acute dizziness, diplopia, nausea.  She continued to have dizziness and visual symptoms ane presented to the ED. PT was admitted.   She was evaluated by Neurology.  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  Additional work up included MRA- without occlusion, cartotid duplex which did not show significant stenosis, a 2D echo which revealed a normal LVEF.  Note was made of mild mitral regurgitation. She was started on a low dose aspirin for stroke prevention.  LDL was at goal at 73.  A1C was normal.   Since returning home she reports ongoing double vision.  She is no longer dizzy.  Denies associated numbness or weakness. PT will be starting for her eye soon.  She is also undergoing separate therapy for her hip.  The patient tells me that her GYN was in the process of weaning her down on her estrogen prior to her surgery, but now has had her discontinue completely due to recent CVA.   Left THA- has appointment today with Dr. Magnus Ivan for staple removal. Reports feeling well post operatively from an orthopedic perspective.   Review of Systems See HPI  Past Medical History  Diagnosis Date  . Arthritis   . Hyperlipidemia   . Hypertension   . Complication of anesthesia     difficulty urinating after    History   Social History  . Marital Status: Married    Spouse Name: N/A    Number of Children: 2  . Years of Education: N/A   Occupational History  . Not on file.   Social History Main Topics  . Smoking status: Never Smoker   . Smokeless tobacco: Never Used  . Alcohol Use: 1.8 oz/week    3 Glasses of wine per week  . Drug Use: No  .  Sexual Activity: Not on file   Other Topics Concern  . 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 Osteotomy  1974 & 1975  . Cesarean section      x 2  . Bunionectomy    . Total hip arthroplasty Right 01/18/2013    Procedure: Right TOTAL HIP ARTHROPLASTY ANTERIOR APPROACH;  Surgeon: Kathryne Hitch, MD;  Location: [facility] OR;  Service: Orthopedics;  Laterality: Right;  . Hernia repair Left 1977    inguinal  . Total hip arthroplasty Left 08/09/2013    Procedure: LEFT TOTAL HIP ARTHROPLASTY ANTERIOR APPROACH;  Surgeon: Kathryne Hitch, MD;  Location: [facility] OR;  Service: Orthopedics;  Laterality: Left;    Family History  Problem Relation Age of Onset  . Arthritis Mother     osateoarthritis  . Hyperlipidemia Mother   . Heart disease Mother   . Hypertension Mother   . Hyperlipidemia Father   . Heart disease Father   . Stroke Father   . Parkinson's disease Father   . Cancer  Maternal Grandfather     lung  . Cancer Cousin     breast    Allergies  Allergen Reactions  . Achromycin [Tetracycline] Hives    Current Outpatient Prescriptions on File Prior to Visit  Medication Sig Dispense Refill  . aspirin EC 325 MG EC tablet Take 1 tablet (325 mg total) by mouth daily with breakfast.  100 tablet  0  . Calcium Citrate-Vitamin D 500-400 MG-UNIT CHEW Chew 2 tablets by mouth 2 (two) times daily.      Marland Kitchen HYDROcodone-acetaminophen (NORCO/VICODIN) 5-325 MG per tablet Take 1-2 tablets by mouth every 6 (six) hours as needed for pain.      . Lactobacillus (ACIDOPHILUS) 100 MG CAPS Take 100 mg by mouth daily.      . methocarbamol (ROBAXIN) 500 MG tablet Take 500 mg by mouth every 6 (six) hours as needed (for muscle spasms).      . Multiple Vitamin (MULTIVITAMIN WITH MINERALS) TABS  Take 1 tablet by mouth daily. Centrum Silver      . Omega-3 Fatty Acids (FISH OIL) 1200 MG CAPS Take 1 capsule by mouth daily.      . pravastatin (PRAVACHOL) 20 MG tablet Take 1 tablet (20 mg total) by mouth daily.  30 tablet  5  . UNABLE TO FIND Out patient occupational therapy. Dx: Diplopia due to CVA.  1 each  0   No current facility-administered medications on file prior to visit.    BP 134/98  Pulse 73  Temp(Src) 98.2 F (36.8 C) (Oral)  Resp 16  Ht 5\' 4"  (1.626 m)  Wt 157 lb 1.9 oz (71.269 kg)  BMI 26.96 kg/m2  SpO2 97%  LMP 11/17/2010       Objective:   Physical Exam  Constitutional: She is oriented to person, place, and time. She appears well-developed and well-nourished. No distress.  HENT:  Head: Normocephalic and atraumatic.  Cardiovascular: Normal rate and regular rhythm.   No murmur heard. Pulmonary/Chest: Effort normal and breath sounds normal. No respiratory distress. She has no wheezes. She has no rales. She exhibits no tenderness.  Neurological: She is alert and oriented to person, place, and time.  Reduced right lateral eye movement.   Skin:  Incision left anterior hip- mild erythema noted along edges of incision.  Staples clean, dry, intact  Psychiatric: She has a normal mood and affect. Her behavior is normal. Judgment and thought content normal.          Assessment & Plan:

## 2013-09-23 ENCOUNTER — Encounter: Payer: Self-pay | Admitting: Family

## 2013-09-23 ENCOUNTER — Ambulatory Visit (INDEPENDENT_AMBULATORY_CARE_PROVIDER_SITE_OTHER): Payer: BC Managed Care – PPO | Admitting: Family

## 2013-09-23 VITALS — BP 120/80 | HR 73 | Temp 97.8°F | Resp 16 | Ht 64.0 in | Wt 155.0 lb

## 2013-09-23 DIAGNOSIS — I10 Essential (primary) hypertension: Secondary | ICD-10-CM

## 2013-09-23 DIAGNOSIS — H510 Palsy (spasm) of conjugate gaze: Secondary | ICD-10-CM

## 2013-09-23 LAB — BASIC METABOLIC PANEL
BUN: 13 mg/dL (ref 6–23)
CO2: 30 mEq/L (ref 19–32)
Calcium: 9.5 mg/dL (ref 8.4–10.5)
Chloride: 103 mEq/L (ref 96–112)
Creat: 0.67 mg/dL (ref 0.50–1.10)
Glucose, Bld: 86 mg/dL (ref 70–99)
Potassium: 4.7 mEq/L (ref 3.5–5.3)
Sodium: 139 mEq/L (ref 135–145)

## 2013-09-23 MED ORDER — METOPROLOL SUCCINATE ER 25 MG PO TB24: 25.0000 mg | ORAL_TABLET | Freq: Every day | ORAL | Status: DC

## 2013-09-23 NOTE — Progress Notes (Signed)
Subjective:    Patient ID: Melissa Grimes, female    DOB: Mar 20, 1956, 57 y.o.   MRN: [id number]  HPI  Ms. Goosby is a 57 yr old female who presents today for follow up of her HTN.  Last visit BP was noted to be elevated.  Lisinopril was restarted.    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  She is also working with Serita Grit at [facility] who is helping her with exerciseses for her right eye.     Review of Systems See HPI  Past Medical History  Diagnosis Date  . Arthritis   . Hyperlipidemia   . Hypertension   . Complication of anesthesia     difficulty urinating after    History   Social History  . Marital Status: Married    Spouse Name: N/A    Number of Children: 2  . Years of Education: N/A   Occupational History  . Not on file.   Social History Main Topics  . Smoking status: Never Smoker   . Smokeless tobacco: Never Used  . Alcohol Use: 1.8 oz/week    3 Glasses of wine per week  . Drug Use: No  . Sexual Activity: Not on file   Other Topics Concern  . 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 Osteotomy  1974 & 1975  . Cesarean section      x 2  . Bunionectomy    . Total hip arthroplasty Right 01/18/2013    Procedure: Right TOTAL HIP ARTHROPLASTY ANTERIOR APPROACH;  Surgeon: Kathryne Hitch, MD;  Location: [facility] OR;  Service: Orthopedics;  Laterality: Right;  . Hernia repair Left 1977    inguinal  . Total hip arthroplasty Left 08/09/2013    Procedure: LEFT TOTAL HIP ARTHROPLASTY ANTERIOR APPROACH;  Surgeon: Kathryne Hitch, MD;  Location: [facility] OR;  Service: Orthopedics;  Laterality: Left;    Family History  Problem Relation Age of Onset  . Arthritis Mother     osateoarthritis  .  Hyperlipidemia Mother   . Heart disease Mother   . Hypertension Mother   . Hyperlipidemia Father   . Heart disease Father   . Stroke Father   . Parkinson's disease Father   . Cancer Maternal Grandfather     lung  . Cancer Cousin     breast    Allergies  Allergen Reactions  . Achromycin [Tetracycline] Hives    Current Outpatient Prescriptions on File Prior to Visit  Medication Sig Dispense Refill  . aspirin EC 325 MG EC tablet Take 1 tablet (325 mg total) by mouth daily with breakfast.  100 tablet  0  . Calcium Citrate-Vitamin D 500-400 MG-UNIT CHEW Chew 2 tablets by mouth 2 (two) times daily.      . Lactobacillus (ACIDOPHILUS) 100 MG CAPS Take 100 mg by mouth daily.      Marland Kitchen lisinopril (PRINIVIL,ZESTRIL) 20 MG tablet Take 20 mg by mouth daily.      . methocarbamol (ROBAXIN) 500 MG tablet Take 500 mg by mouth every 6 (six) hours as needed (for muscle spasms).      . Multiple Vitamin (MULTIVITAMIN WITH MINERALS) TABS Take 1 tablet by mouth  daily. Centrum Silver      . Omega-3 Fatty Acids (FISH OIL) 1200 MG CAPS Take 1 capsule by mouth daily.      . pravastatin (PRAVACHOL) 20 MG tablet Take 1 tablet (20 mg total) by mouth daily.  30 tablet  5  . UNABLE TO FIND Out patient occupational therapy. Dx: Diplopia due to CVA.  1 each  0   No current facility-administered medications on file prior to visit.    BP 120/80  Pulse 73  Temp(Src) 97.8 F (36.6 C) (Oral)  Resp 16  Ht 5\' 4"  (1.626 m)  Wt 155 lb (70.308 kg)  BMI 26.59 kg/m2  SpO2 97%  LMP 11/17/2010       Objective:   Physical Exam  Constitutional: She is oriented to person, place, and time. She appears well-developed and well-nourished.  HENT:  Head: Normocephalic and atraumatic.  Eyes:  R eye unable to gaze laterally  Musculoskeletal: She exhibits no edema.  Neurological: She is alert and oriented to person, place, and time.  Psychiatric: She has a normal mood and affect. Her behavior is normal. Judgment and  thought content normal.          Assessment & Plan:

## 2013-09-23 NOTE — Assessment & Plan Note (Signed)
Slowly improving. Continue work with OT.

## 2013-09-23 NOTE — Assessment & Plan Note (Signed)
  R arm 142/95, left arm 138/95 DBP is above goal. Add toprol xl. Continue lisinopril 20mg .  Check bmet.  BP Readings from Last 3 Encounters:  09/23/13 120/80  08/24/13 134/98  08/15/13 136/80

## 2013-09-23 NOTE — Patient Instructions (Signed)
Complete lab work prior to leaving.  Please continue lisinopril. Start Toprol xl once daily. Follow up in 1 month.

## 2013-09-26 ENCOUNTER — Encounter: Payer: Self-pay | Admitting: Family

## 2013-10-18 ENCOUNTER — Ambulatory Visit: Payer: Self-pay | Admitting: Nurse Practitioner

## 2013-10-21 ENCOUNTER — Encounter: Payer: Self-pay | Admitting: Family

## 2013-10-21 ENCOUNTER — Ambulatory Visit (INDEPENDENT_AMBULATORY_CARE_PROVIDER_SITE_OTHER): Payer: BC Managed Care – PPO | Admitting: Family

## 2013-10-21 VITALS — BP 104/76 | HR 59 | Temp 97.4°F | Resp 16 | Ht 64.0 in | Wt 155.1 lb

## 2013-10-21 DIAGNOSIS — I10 Essential (primary) hypertension: Secondary | ICD-10-CM

## 2013-10-21 NOTE — Patient Instructions (Signed)
Please cut toprol xl in half and take 1/2 tab once daily.  Check blood pressure once daily for 1 week.  Call me in 1 week with readings. Follow up in 3 months for visit.

## 2013-10-21 NOTE — Progress Notes (Signed)
Subjective:    Patient ID: Melissa Grimes, female    DOB: October 13, 1956, 57 y.o.   MRN: [id number]  HPI  Melissa Grimes is a 57 yr old female who presents today for follow up of her HTN. Last visit her BP was noted to be above goal on lisinopril 20 mg.  Toprol xl was added to her regimen. She has a BP cuff at home.    She has started extra strength tylenol.  This is helping with some of her orthopedic pain.      Review of Systems See HPI  Past Medical History  Diagnosis Date  . Arthritis   . Hyperlipidemia   . Hypertension   . Complication of anesthesia     difficulty urinating after    History   Social History  . Marital Status: Married    Spouse Name: N/A    Number of Children: 2  . Years of Education: N/A   Occupational History  . Not on file.   Social History Main Topics  . Smoking status: Never Smoker   . Smokeless tobacco: Never Used  . Alcohol Use: 1.8 oz/week    3 Glasses of wine per week  . Drug Use: No  . Sexual Activity: Not on file   Other Topics Concern  . 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 Osteotomy  1974 & 1975  . Cesarean section      x 2  . Bunionectomy    . Total hip arthroplasty Right 01/18/2013    Procedure: Right TOTAL HIP ARTHROPLASTY ANTERIOR APPROACH;  Surgeon: Kathryne Hitch, MD;  Location: [facility] OR;  Service: Orthopedics;  Laterality: Right;  . Hernia repair Left 1977    inguinal  . Total hip arthroplasty Left 08/09/2013    Procedure: LEFT TOTAL HIP ARTHROPLASTY ANTERIOR APPROACH;  Surgeon: Kathryne Hitch, MD;  Location: [facility] OR;  Service: Orthopedics;  Laterality: Left;    Family History  Problem Relation Age of Onset  . Arthritis Mother     osateoarthritis  . Hyperlipidemia Mother   . Heart disease  Mother   . Hypertension Mother   . Hyperlipidemia Father   . Heart disease Father   . Stroke Father   . Parkinson's disease Father   . Cancer Maternal Grandfather     lung  . Cancer Cousin     breast    Allergies  Allergen Reactions  . Achromycin [Tetracycline] Hives    Current Outpatient Prescriptions on File Prior to Visit  Medication Sig Dispense Refill  . aspirin EC 325 MG EC tablet Take 1 tablet (325 mg total) by mouth daily with breakfast.  100 tablet  0  . Calcium Citrate-Vitamin D 500-400 MG-UNIT CHEW Chew 2 tablets by mouth 2 (two) times daily.      . Lactobacillus (ACIDOPHILUS) 100 MG CAPS Take 100 mg by mouth daily.      Marland Kitchen lisinopril (PRINIVIL,ZESTRIL) 20 MG tablet Take 20 mg by mouth daily.      . methocarbamol (ROBAXIN) 500 MG tablet Take 500 mg by mouth every 6 (six) hours as needed (for muscle spasms).      . metoprolol succinate (TOPROL-XL) 25 MG 24 hr tablet Take 1 tablet (25 mg  total) by mouth daily.  30 tablet  2  . Multiple Vitamin (MULTIVITAMIN WITH MINERALS) TABS Take 1 tablet by mouth daily. Centrum Silver      . Omega-3 Fatty Acids (FISH OIL) 1200 MG CAPS Take 1 capsule by mouth daily.      . pravastatin (PRAVACHOL) 20 MG tablet Take 1 tablet (20 mg total) by mouth daily.  30 tablet  5  . UNABLE TO FIND Out patient occupational therapy. Dx: Diplopia due to CVA.  1 each  0   No current facility-administered medications on file prior to visit.    BP 104/76  Pulse 59  Temp(Src) 97.4 F (36.3 C) (Oral)  Resp 16  Ht 5\' 4"  (1.626 m)  Wt 155 lb 1.3 oz (70.344 kg)  BMI 26.61 kg/m2  SpO2 97%  LMP 11/17/2010       Objective:   Physical Exam  Constitutional: She is oriented to person, place, and time. She appears well-developed and well-nourished. No distress.  Cardiovascular: Normal rate and regular rhythm.   No murmur heard. Pulmonary/Chest: Effort normal and breath sounds normal. No respiratory distress. She has no wheezes. She has no rales. She  exhibits no tenderness.  Neurological: She is alert and oriented to person, place, and time.  Psychiatric: She has a normal mood and affect. Her behavior is normal. Judgment and thought content normal.          Assessment & Plan:

## 2013-10-21 NOTE — Progress Notes (Signed)
Pre visit review using our clinic review tool, if applicable. No additional management support is needed unless otherwise documented below in the visit note. 

## 2013-10-21 NOTE — Assessment & Plan Note (Signed)
Blood pressure appears slightly overtreated.  HR is 59.  Asymptomatic. Continue current dose lisinopril. Cut Toprol xl from 25 mg to 12.5 mg.  Pt instructed to check bp once daily for the next week and contact us with her readings.

## 2013-10-24 ENCOUNTER — Ambulatory Visit: Payer: Self-pay | Admitting: Neurology

## 2013-11-25 ENCOUNTER — Telehealth: Payer: Self-pay | Admitting: *Deleted

## 2013-11-25 MED ORDER — LISINOPRIL 20 MG PO TABS
20.0000 mg | ORAL_TABLET | Freq: Every day | ORAL | Status: DC
Start: 2013-11-25 — End: 2014-01-20

## 2013-11-25 MED ORDER — METOPROLOL SUCCINATE ER 25 MG PO TB24: 12.5000 mg | ORAL_TABLET | Freq: Every day | ORAL | Status: DC

## 2013-11-25 NOTE — Telephone Encounter (Signed)
Pt called to reports BP readings since taking beta blocker and lisinopril are usually in the 100s / 80s. Had one elevated BP 138/96, others have been:  117/81, 123/84, 126/82. Pt is requesting refill of medications. Please advise if ok to continue current doses of lisinopril and metoprolol?

## 2013-11-25 NOTE — Telephone Encounter (Signed)
Notified pt and she voices understanding. 

## 2013-11-25 NOTE — Telephone Encounter (Signed)
Yes, ok to continue current dosing. Refills sent to pharmacy.

## 2013-11-28 ENCOUNTER — Ambulatory Visit: Payer: Self-pay | Admitting: Neurology

## 2014-01-10 ENCOUNTER — Ambulatory Visit: Payer: BC Managed Care – PPO | Admitting: Family

## 2014-01-20 ENCOUNTER — Ambulatory Visit (INDEPENDENT_AMBULATORY_CARE_PROVIDER_SITE_OTHER): Payer: BC Managed Care – PPO | Admitting: Family

## 2014-01-20 ENCOUNTER — Encounter: Payer: Self-pay | Admitting: Family

## 2014-01-20 VITALS — BP 118/84 | HR 62 | Temp 98.5°F | Resp 16 | Ht 64.0 in | Wt 160.1 lb

## 2014-01-20 DIAGNOSIS — E663 Overweight: Secondary | ICD-10-CM

## 2014-01-20 DIAGNOSIS — I10 Essential (primary) hypertension: Secondary | ICD-10-CM

## 2014-01-20 DIAGNOSIS — I6350 Cerebral infarction due to unspecified occlusion or stenosis of unspecified cerebral artery: Secondary | ICD-10-CM

## 2014-01-20 DIAGNOSIS — I639 Cerebral infarction, unspecified: Secondary | ICD-10-CM

## 2014-01-20 LAB — BASIC METABOLIC PANEL
BUN: 24 mg/dL — ABNORMAL HIGH (ref 6–23)
CO2: 27 mEq/L (ref 19–32)
Calcium: 9.7 mg/dL (ref 8.4–10.5)
Chloride: 102 mEq/L (ref 96–112)
Creat: 0.73 mg/dL (ref 0.50–1.10)
Glucose, Bld: 92 mg/dL (ref 70–99)
Potassium: 4.9 mEq/L (ref 3.5–5.3)
Sodium: 138 mEq/L (ref 135–145)

## 2014-01-20 MED ORDER — METOPROLOL SUCCINATE ER 25 MG PO TB24: 12.5000 mg | ORAL_TABLET | Freq: Every day | ORAL | Status: DC

## 2014-01-20 MED ORDER — LISINOPRIL 20 MG PO TABS: 20.0000 mg | ORAL_TABLET | Freq: Every day | ORAL | Status: DC

## 2014-01-20 NOTE — Progress Notes (Signed)
Subjective:    Patient ID: Melissa Grimes, female    DOB: Aug 17, 1956, 58 y.o.   MRN: [id number]  HPI  Melissa Grimes is a 58 yr old female who presents today for follow up of her hypertension.  She was last seen in December. Last visit BP appeared slightly overtreated.  Her toprol was cut from 25 mg to 12.5 mg. Lisinopril was continued.  BP Readings from Last 3 Encounters:  01/20/14 118/84  10/21/13 104/76  09/23/13 120/80   CVA- stopped aspirin, diplopia is improving.  She has started driving again. Uses a prism glasses- this is helping.  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  She is seeing Dr. Gerome Apley- optometrist, who specialiezes in stroke related to the eye.  This is helping.   Weight gain- Body mass index is 27.46 kg/(m^2).  Wt Readings from Last 3 Encounters:  01/20/14 160 lb 1.3 oz (72.612 kg)  10/21/13 155 lb 1.3 oz (70.344 kg)  09/23/13 155 lb (70.308 kg)    Review of Systems    see HPI  Past Medical History  Diagnosis Date  . Arthritis   . Hyperlipidemia   . Hypertension   . Complication of anesthesia     difficulty urinating after    History   Social History  . Marital Status: Married    Spouse Name: N/A    Number of Children: 2  . Years of Education: N/A   Occupational History  . Not on file.   Social History Main Topics  . Smoking status: Never Smoker   . Smokeless tobacco: Never Used  . Alcohol Use: 1.8 oz/week    3 Glasses of wine per week  . Drug Use: No  . Sexual Activity: Not on file   Other Topics Concern  . 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 Osteotomy  1974 & 1975  . Cesarean section      x 2  . Bunionectomy    . Total hip arthroplasty Right 01/18/2013    Procedure: Right TOTAL HIP ARTHROPLASTY ANTERIOR  APPROACH;  Surgeon: Mcarthur Rossetti, MD;  Location: [facility];  Service: Orthopedics;  Laterality: Right;  . Hernia repair Left 1977    inguinal  . Total hip arthroplasty Left 08/09/2013    Procedure: LEFT TOTAL HIP ARTHROPLASTY ANTERIOR APPROACH;  Surgeon: Mcarthur Rossetti, MD;  Location: [facility];  Service: Orthopedics;  Laterality: Left;    Family History  Problem Relation Age of Onset  . Arthritis Mother     osateoarthritis  . Hyperlipidemia Mother   . Heart disease Mother   . Hypertension Mother   . Hyperlipidemia Father   . Heart disease Father   . Stroke Father   . Parkinson's disease Father   . Cancer Maternal Grandfather     lung  . Cancer Cousin     breast    Allergies  Allergen Reactions  . Achromycin [Tetracycline] Hives    Current Outpatient Prescriptions on File Prior to Visit  Medication Sig Dispense Refill  . Calcium Citrate-Vitamin D 500-400 MG-UNIT CHEW Chew 2 tablets by mouth 2 (two) times daily.      . Lactobacillus (ACIDOPHILUS) 100 MG CAPS Take 100 mg by mouth daily.      Marland Kitchen  lisinopril (PRINIVIL,ZESTRIL) 20 MG tablet Take 1 tablet (20 mg total) by mouth daily.  30 tablet  2  . metoprolol succinate (TOPROL-XL) 25 MG 24 hr tablet Take 0.5 tablets (12.5 mg total) by mouth daily.  30 tablet  2  . Multiple Vitamin (MULTIVITAMIN WITH MINERALS) TABS Take 1 tablet by mouth daily. Centrum Silver      . Omega-3 Fatty Acids (FISH OIL) 1200 MG CAPS Take 1 capsule by mouth daily.      . pravastatin (PRAVACHOL) 20 MG tablet Take 1 tablet (20 mg total) by mouth daily.  30 tablet  5   No current facility-administered medications on file prior to visit.    BP 118/84  Pulse 62  Temp(Src) 98.5 F (36.9 C) (Oral)  Resp 16  Ht 5\' 4"  (1.626 m)  Wt 160 lb 1.3 oz (72.612 kg)  BMI 27.46 kg/m2  SpO2 99%  LMP 11/17/2010    Objective:   Physical Exam  Constitutional: She is oriented to person, place, and time. She appears well-developed and well-nourished. No  distress.  Cardiovascular: Normal rate and regular rhythm.   No murmur heard. Pulmonary/Chest: Effort normal and breath sounds normal. No respiratory distress. She has no wheezes. She has no rales. She exhibits no tenderness.  Neurological: She is alert and oriented to person, place, and time.  Psychiatric: She has a normal mood and affect. Her behavior is normal. Judgment and thought content normal.          Assessment & Plan:

## 2014-01-20 NOTE — Patient Instructions (Signed)
Please complete lab work prior to leaving.  Follow up in 6 months.  

## 2014-01-20 NOTE — Progress Notes (Signed)
Pre visit review using our clinic review tool, if applicable. No additional management support is needed unless otherwise documented below in the visit note. 

## 2014-01-21 ENCOUNTER — Telehealth: Payer: Self-pay | Admitting: Family

## 2014-01-21 DIAGNOSIS — E663 Overweight: Secondary | ICD-10-CM | POA: Insufficient documentation

## 2014-01-21 NOTE — Assessment & Plan Note (Signed)
BMI 27. We discussed goal BMI <25. Discussed exercise, diet- counting calories using my fitness pal

## 2014-01-21 NOTE — Assessment & Plan Note (Addendum)
BP stable on current meds. Continue same. Obtain bmet 

## 2014-01-21 NOTE — Assessment & Plan Note (Signed)
She is clinically improving with ocular PT and prism glasses. Continue ASA and statin for secondary stroke prevention.

## 2014-01-21 NOTE — Telephone Encounter (Signed)
Relevant patient education assigned to patient using Emmi. ° °

## 2014-01-23 ENCOUNTER — Telehealth: Payer: Self-pay | Admitting: Family

## 2014-01-23 ENCOUNTER — Telehealth: Payer: Self-pay | Admitting: *Deleted

## 2014-01-23 DIAGNOSIS — N289 Disorder of kidney and ureter, unspecified: Secondary | ICD-10-CM

## 2014-01-23 NOTE — Telephone Encounter (Signed)
Notified pt and she voices understanding. Has been using NSAIDS and will try to avoid in the future.  Lab order entered.

## 2014-01-23 NOTE — Telephone Encounter (Signed)
Relevant patient education assigned to patient using Emmi. ° °

## 2014-01-23 NOTE — Telephone Encounter (Signed)
Message copied by Ronny Flurry on Mon Jan 23, 2014  3:12 PM ------      Message from: O'SULLIVAN, MELISSA      Created: Sat Jan 21, 2014  8:05 PM       BUN- one of the kidney tests slightly elevated. Could be due to mild dehydration or anti-inflammatory use.  Recommend that she make sure she is drinking 64 oz of water a day, avoid NSAIDS if she is taking. Repeat bmet in 2 weeks- dx is renal insufficiency. ------

## 2014-03-07 ENCOUNTER — Telehealth: Payer: Self-pay | Admitting: *Deleted

## 2014-03-07 NOTE — Telephone Encounter (Signed)
Pt left message that she has taken a job in [place] and will remain there until the first of June. Pt states she has not been taking any more NSAIDS and has been hydrating. Pt wants to know if it is necessary to have the labs checked while she is in [facility] or can she wait until she returns in June?  Please advise.

## 2014-03-08 NOTE — Telephone Encounter (Signed)
Left detailed message on cell and to call if any questions. 

## 2014-03-08 NOTE — Telephone Encounter (Signed)
I think she can wait until she returns in June.

## 2014-06-06 ENCOUNTER — Other Ambulatory Visit: Payer: Self-pay | Admitting: Family

## 2014-06-09 ENCOUNTER — Other Ambulatory Visit: Payer: Self-pay | Admitting: *Deleted

## 2014-06-09 MED ORDER — PRAVASTATIN SODIUM 20 MG PO TABS: 20.0000 mg | ORAL_TABLET | Freq: Every day | ORAL | Status: DC

## 2014-07-06 ENCOUNTER — Other Ambulatory Visit: Payer: Self-pay | Admitting: Family

## 2014-07-28 ENCOUNTER — Encounter: Payer: Self-pay | Admitting: Family

## 2014-07-28 ENCOUNTER — Ambulatory Visit (INDEPENDENT_AMBULATORY_CARE_PROVIDER_SITE_OTHER): Payer: BC Managed Care – PPO | Admitting: Family

## 2014-07-28 VITALS — BP 130/82 | HR 66 | Temp 97.7°F | Resp 16 | Ht 64.0 in | Wt 156.4 lb

## 2014-07-28 DIAGNOSIS — I10 Essential (primary) hypertension: Secondary | ICD-10-CM

## 2014-07-28 DIAGNOSIS — E785 Hyperlipidemia, unspecified: Secondary | ICD-10-CM

## 2014-07-28 DIAGNOSIS — I6350 Cerebral infarction due to unspecified occlusion or stenosis of unspecified cerebral artery: Secondary | ICD-10-CM

## 2014-07-28 DIAGNOSIS — I639 Cerebral infarction, unspecified: Secondary | ICD-10-CM

## 2014-07-28 DIAGNOSIS — Z23 Encounter for immunization: Secondary | ICD-10-CM

## 2014-07-28 MED ORDER — LISINOPRIL 20 MG PO TABS: ORAL_TABLET | ORAL | Status: DC

## 2014-07-28 MED ORDER — PRAVASTATIN SODIUM 20 MG PO TABS: 20.0000 mg | ORAL_TABLET | Freq: Every day | ORAL | Status: DC

## 2014-07-28 MED ORDER — METOPROLOL SUCCINATE ER 25 MG PO TB24: 12.5000 mg | ORAL_TABLET | Freq: Every day | ORAL | Status: DC

## 2014-07-28 NOTE — Progress Notes (Signed)
Pre visit review using our clinic review tool, if applicable. No additional management support is needed unless otherwise documented below in the visit note. 

## 2014-07-28 NOTE — Progress Notes (Signed)
Subjective:    Patient ID: Melissa Grimes, female    DOB: [birth year], 58 y.o.   MRN: [id number]  HPI  Melissa Grimes is a 58 yr old female who presents today for follow up.  1) HTN- she is maintained on lisinopril, metoprolol. Denies CP/SOB or swelling BP Readings from Last 3 Encounters:  07/28/14 130/82  01/20/14 118/84  10/21/13 104/76    2) Hyperlipidemia- she is maintained on pravastatin.  Denies myalgia.   Lab Results  Component Value Date   CHOL 141 08/15/2013   HDL 36* 08/15/2013   LDLCALC 73 08/15/2013   TRIG 158* 08/15/2013   CHOLHDL 3.9 08/15/2013   3) Hx of CVA-She is maintained on aspirin 81mg  once daily. She reports that her vision in her right eye has improved significantly. She reports that she now only has visual deficit in the right peripheral field.   Review of Systems See HPI  Past Medical History  Diagnosis Date  . Arthritis   . Hyperlipidemia   . Hypertension   . Complication of anesthesia     difficulty urinating after    History   Social History  . Marital Status: Married    Spouse Name: N/A    Number of Children: 2  . Years of Education: N/A   Occupational History  . Not on file.   Social History Main Topics  . Smoking status: Never Smoker   . Smokeless tobacco: Never Used  . Alcohol Use: 1.8 oz/week    3 Glasses of wine per week  . Drug Use: No  . Sexual Activity: Not on file   Other Topics Concern  . Not on file   Social History Narrative   Corporate treasurer   Completed 2 yrs college   Married- from [place]   2 children- grown both in [place]   Enjoys writing christian ed material   1 cat    Past Surgical History  Procedure Laterality Date  . Osteotomy  1974 & 1975  . Cesarean section      x 2  . Bunionectomy    . Total hip arthroplasty Right 01/18/2013    Procedure: Right TOTAL HIP ARTHROPLASTY ANTERIOR APPROACH;  Surgeon: Mcarthur Rossetti, MD;  Location: [facility];  Service: Orthopedics;  Laterality: Right;  .  Hernia repair Left 1977    inguinal  . Total hip arthroplasty Left 08/09/2013    Procedure: LEFT TOTAL HIP ARTHROPLASTY ANTERIOR APPROACH;  Surgeon: Mcarthur Rossetti, MD;  Location: [facility];  Service: Orthopedics;  Laterality: Left;    Family History  Problem Relation Age of Onset  . Arthritis Mother     osateoarthritis  . Hyperlipidemia Mother   . Heart disease Mother   . Hypertension Mother   . Hyperlipidemia Father   . Heart disease Father   . Stroke Father   . Parkinson's disease Father   . Cancer Maternal Grandfather     lung  . Cancer Cousin     breast    Allergies  Allergen Reactions  . Achromycin [Tetracycline] Hives    Current Outpatient Prescriptions on File Prior to Visit  Medication Sig Dispense Refill  . aspirin EC 81 MG tablet Take 81 mg by mouth daily.      . Calcium Citrate-Vitamin D 500-400 MG-UNIT CHEW Chew 2 tablets by mouth 2 (two) times daily.      . Lactobacillus (ACIDOPHILUS) 100 MG CAPS Take 100 mg by mouth daily.      Marland Kitchen lisinopril (PRINIVIL,ZESTRIL) 20  MG tablet TAKE 1 TABLET DAILY  30 tablet  1  . metoprolol succinate (TOPROL-XL) 25 MG 24 hr tablet Take 0.5 tablets (12.5 mg total) by mouth daily.  45 tablet  1  . Omega-3 Fatty Acids (FISH OIL) 1200 MG CAPS Take 1 capsule by mouth daily.      . pravastatin (PRAVACHOL) 20 MG tablet Take 1 tablet (20 mg total) by mouth daily.  30 tablet  3   No current facility-administered medications on file prior to visit.    BP 130/82  Pulse 66  Temp(Src) 97.7 F (36.5 C) (Oral)  Resp 16  Ht 5\' 4"  (1.626 m)  Wt 156 lb 6.4 oz (70.943 kg)  BMI 26.83 kg/m2  SpO2 96%  LMP 11/17/2010       Objective:   Physical Exam  Constitutional: She is oriented to person, place, and time. She appears well-developed and well-nourished. No distress.  HENT:  Head: Normocephalic and atraumatic.  Cardiovascular: Normal rate and regular rhythm.   No murmur heard. Pulmonary/Chest: Effort normal and breath sounds  normal. No respiratory distress. She has no wheezes. She has no rales. She exhibits no tenderness.  Neurological: She is alert and oriented to person, place, and time.  Psychiatric: She has a normal mood and affect. Her behavior is normal. Judgment and thought content normal.          Assessment & Plan:

## 2014-07-28 NOTE — Assessment & Plan Note (Signed)
Tolerating statin- continue same, obtain FLP.

## 2014-07-28 NOTE — Assessment & Plan Note (Signed)
Stable on current meds. Continue same, obtain bmet.  

## 2014-07-28 NOTE — Assessment & Plan Note (Signed)
Clinically improving. Continue off estrogen. Continue strict BP control/lipid control.

## 2014-07-28 NOTE — Patient Instructions (Signed)
Please return fasting for lab work. Follow up in 6 months for office visit.

## 2014-08-01 ENCOUNTER — Other Ambulatory Visit (INDEPENDENT_AMBULATORY_CARE_PROVIDER_SITE_OTHER): Payer: BC Managed Care – PPO

## 2014-08-01 ENCOUNTER — Encounter: Payer: Self-pay | Admitting: Family

## 2014-08-01 DIAGNOSIS — I10 Essential (primary) hypertension: Secondary | ICD-10-CM

## 2014-08-01 DIAGNOSIS — E785 Hyperlipidemia, unspecified: Secondary | ICD-10-CM

## 2014-08-01 LAB — HEPATIC FUNCTION PANEL
ALT: 15 U/L (ref 0–35)
AST: 21 U/L (ref 0–37)
Albumin: 4.3 g/dL (ref 3.5–5.2)
Alkaline Phosphatase: 83 U/L (ref 39–117)
Bilirubin, Direct: 0 mg/dL (ref 0.0–0.3)
Total Bilirubin: 0.7 mg/dL (ref 0.2–1.2)
Total Protein: 7.8 g/dL (ref 6.0–8.3)

## 2014-08-01 LAB — BASIC METABOLIC PANEL
BUN: 15 mg/dL (ref 6–23)
CO2: 28 mEq/L (ref 19–32)
Calcium: 10.2 mg/dL (ref 8.4–10.5)
Chloride: 103 mEq/L (ref 96–112)
Creatinine, Ser: 0.8 mg/dL (ref 0.4–1.2)
GFR: 83.85 mL/min (ref >60.00)
Glucose, Bld: 95 mg/dL (ref 70–99)
Potassium: 4.9 mEq/L (ref 3.5–5.1)
Sodium: 140 mEq/L (ref 135–145)

## 2014-08-01 LAB — LIPID PANEL
Cholesterol: 181 mg/dL (ref 0–200)
HDL: 52.7 mg/dL (ref >39.00)
LDL Cholesterol: 103 mg/dL — ABNORMAL HIGH (ref 0–99)
NonHDL: 128.3
Total CHOL/HDL Ratio: 3
Triglycerides: 129 mg/dL (ref 0.0–149.0)
VLDL: 25.8 mg/dL (ref 0.0–40.0)

## 2014-08-03 ENCOUNTER — Encounter: Payer: Self-pay | Admitting: Family

## 2015-01-28 ENCOUNTER — Other Ambulatory Visit: Payer: Self-pay | Admitting: Family

## 2015-01-29 NOTE — Telephone Encounter (Signed)
Rx request to pharmacy/[facility] Requested drug refills are authorized, however, the patient needs further evaluation and/or laboratory testing before further refills are given. Ask her to make an appointment for this.  

## 2015-02-02 ENCOUNTER — Ambulatory Visit (INDEPENDENT_AMBULATORY_CARE_PROVIDER_SITE_OTHER): Payer: BLUE CROSS/BLUE SHIELD | Admitting: Family

## 2015-02-02 ENCOUNTER — Encounter: Payer: Self-pay | Admitting: Family

## 2015-02-02 VITALS — BP 128/90 | HR 58 | Temp 97.9°F | Resp 16 | Ht 64.0 in | Wt 149.8 lb

## 2015-02-02 DIAGNOSIS — E785 Hyperlipidemia, unspecified: Secondary | ICD-10-CM

## 2015-02-02 DIAGNOSIS — I10 Essential (primary) hypertension: Secondary | ICD-10-CM

## 2015-02-02 LAB — BASIC METABOLIC PANEL
BUN: 18 mg/dL (ref 6–23)
CO2: 32 mEq/L (ref 19–32)
Calcium: 9.1 mg/dL (ref 8.4–10.5)
Chloride: 105 mEq/L (ref 96–112)
Creatinine, Ser: 0.86 mg/dL (ref 0.40–1.20)
GFR: 71.48 mL/min (ref >60.00)
Glucose, Bld: 95 mg/dL (ref 70–99)
Potassium: 4.6 mEq/L (ref 3.5–5.1)
Sodium: 138 mEq/L (ref 135–145)

## 2015-02-02 NOTE — Progress Notes (Signed)
Subjective:    Patient ID: Melissa Grimes, female    DOB: 01-Apr-1956, 59 y.o.   MRN: [id number]  HPI  Patient presents today for follow up of multiple medical problems.   Hyperlipidemia  Patient is currently maintained on the following medication for hyperlipidemia:  Pravastatin, fish oil Last lipid panel as follows:  Lab Results  Component Value Date   CHOL 181 08/01/2014   HDL 52.70 08/01/2014   LDLCALC 103* 08/01/2014   TRIG 129.0 08/01/2014   CHOLHDL 3 08/01/2014   Patient denies myalgia. Patient reports good compliance with low fat/low cholesterol diet.    Hypertension  Patient is currently maintained on the following medications for blood pressure: toprol xl and lisinopril  Patient reports good compliance with blood pressure medications. Patient denies chest pain, shortness of breath or swelling. Last 3 blood pressure readings in our office are as follows: BP Readings from Last 3 Encounters:  02/02/15 128/90  07/28/14 130/82  01/20/14 118/84          Review of Systems See HPI  Past Medical History  Diagnosis Date  . Arthritis   . Hyperlipidemia   . Hypertension   . Complication of anesthesia     difficulty urinating after    History   Social History  . Marital Status: Married    Spouse Name: N/A  . Number of Children: 2  . Years of Education: N/A   Occupational History  . Not on file.   Social History Main Topics  . Smoking status: Never Smoker   . Smokeless tobacco: Never Used  . Alcohol Use: 1.8 oz/week    3 Glasses of wine per week  . Drug Use: No  . Sexual Activity: Not on file   Other Topics Concern  . 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 Osteotomy  1974 & 1975  . Cesarean section      x 2  . Bunionectomy     . Total hip arthroplasty Right 01/18/2013    Procedure: Right TOTAL HIP ARTHROPLASTY ANTERIOR APPROACH;  Surgeon: Mcarthur Rossetti, MD;  Location: [facility];  Service: Orthopedics;  Laterality: Right;  . Hernia repair Left 1977    inguinal  . Total hip arthroplasty Left 08/09/2013    Procedure: LEFT TOTAL HIP ARTHROPLASTY ANTERIOR APPROACH;  Surgeon: Mcarthur Rossetti, MD;  Location: [facility];  Service: Orthopedics;  Laterality: Left;    Family History  Problem Relation Age of Onset  . Arthritis Mother     osateoarthritis  . Hyperlipidemia Mother   . Heart disease Mother   . Hypertension Mother   . Hyperlipidemia Father   . Heart disease Father   . Stroke Father   . Parkinson's disease Father   . Cancer Maternal Grandfather     lung  . Cancer Cousin     breast    Allergies  Allergen Reactions  . Achromycin [Tetracycline] Hives    Current Outpatient Prescriptions on File Prior to Visit  Medication Sig Dispense Refill  . aspirin EC 81 MG tablet Take 81 mg by mouth daily.    . Calcium Citrate-Vitamin D 500-400 MG-UNIT CHEW Chew 2 tablets by mouth 2 (two) times daily.    . Lactobacillus (ACIDOPHILUS)  100 MG CAPS Take 100 mg by mouth daily.    Marland Kitchen lisinopril (PRINIVIL,ZESTRIL) 20 MG tablet TAKE 1 TABLET BY MOUTH EVERY DAY 90 tablet 0  . metoprolol succinate (TOPROL-XL) 25 MG 24 hr tablet TAKE 1/2 TABLET BY MOUTH DAILY 45 tablet 0  . Multiple Vitamins-Minerals (PRESERVISION AREDS 2 PO) Take 1 tablet by mouth 2 (two) times daily.    . Omega-3 Fatty Acids (FISH OIL) 1200 MG CAPS Take 1 capsule by mouth daily.    . pravastatin (PRAVACHOL) 20 MG tablet TAKE 1 TABLET BY MOUTH EVERY DAY 90 tablet 0   No current facility-administered medications on file prior to visit.    BP 128/90 mmHg  Pulse 58  Temp(Src) 97.9 F (36.6 C) (Oral)  Resp 16  Ht 5\' 4"  (1.626 m)  Wt 149 lb 12.8 oz (67.949 kg)  BMI 25.70 kg/m2  SpO2 98%  LMP 11/17/2010       Objective:   Physical Exam    Constitutional: She is oriented to person, place, and time. She appears well-developed and well-nourished.  Cardiovascular: Normal rate, regular rhythm and normal heart sounds.   No murmur heard. Pulmonary/Chest: Effort normal and breath sounds normal. No respiratory distress. She has no wheezes.  Musculoskeletal: She exhibits no edema.  Neurological: She is alert and oriented to person, place, and time.  Psychiatric: She has a normal mood and affect. Her behavior is normal. Judgment and thought content normal.          Assessment & Plan:

## 2015-02-02 NOTE — Patient Instructions (Signed)
Please follow up in 3 months for a complete physical.  Complete lab work prior to leaving.

## 2015-02-02 NOTE — Progress Notes (Signed)
Pre visit review using our clinic review tool, if applicable. No additional management support is needed unless otherwise documented below in the visit note. 

## 2015-02-03 NOTE — Assessment & Plan Note (Signed)
BP stable on current meds, obtain bmet. Continue current meds.

## 2015-02-03 NOTE — Assessment & Plan Note (Signed)
Lipids at goal on pravastatin and fish oil, continue same.

## 2015-04-11 ENCOUNTER — Telehealth: Payer: Self-pay

## 2015-04-11 DIAGNOSIS — Z0000 Encounter for general adult medical examination without abnormal findings: Secondary | ICD-10-CM

## 2015-04-11 NOTE — Telephone Encounter (Signed)
Imaging request put in for Mammogram.

## 2015-04-19 NOTE — Telephone Encounter (Signed)
Received fax from [facility]. Order placed as diagnostic but reason is screening mammogram. Please verify order.

## 2015-04-29 ENCOUNTER — Other Ambulatory Visit: Payer: Self-pay | Admitting: Family

## 2015-04-30 NOTE — Telephone Encounter (Signed)
Refills sent to pharmacy.  Pt last seen in 01/2015 and advised to schedule physical in 3 months.  Please call pt to arrange appt.

## 2015-05-08 NOTE — Telephone Encounter (Signed)
LVM advising pt to schedule physical appointment

## 2015-05-08 NOTE — Telephone Encounter (Signed)
Mailed letter to pt

## 2015-07-30 ENCOUNTER — Other Ambulatory Visit: Payer: Self-pay | Admitting: Family

## 2015-08-06 ENCOUNTER — Ambulatory Visit (INDEPENDENT_AMBULATORY_CARE_PROVIDER_SITE_OTHER): Payer: BLUE CROSS/BLUE SHIELD | Admitting: Family

## 2015-08-06 ENCOUNTER — Encounter: Payer: Self-pay | Admitting: Family

## 2015-08-06 ENCOUNTER — Other Ambulatory Visit (HOSPITAL_COMMUNITY)
Admission: RE | Admit: 2015-08-06 | Discharge: 2015-08-06 | Disposition: A | Discharge status: Home / Self Care | Payer: BLUE CROSS/BLUE SHIELD | Source: Ambulatory Visit | Attending: Family | Admitting: Family

## 2015-08-06 VITALS — BP 114/76 | HR 64 | Temp 98.1°F | Resp 16 | Wt 153.0 lb

## 2015-08-06 DIAGNOSIS — Z23 Encounter for immunization: Secondary | ICD-10-CM

## 2015-08-06 DIAGNOSIS — N760 Acute vaginitis: Secondary | ICD-10-CM | POA: Insufficient documentation

## 2015-08-06 DIAGNOSIS — Z01411 Encounter for gynecological examination (general) (routine) with abnormal findings: Secondary | ICD-10-CM | POA: Insufficient documentation

## 2015-08-06 DIAGNOSIS — Z1151 Encounter for screening for human papillomavirus (HPV): Secondary | ICD-10-CM | POA: Insufficient documentation

## 2015-08-06 DIAGNOSIS — R7989 Other specified abnormal findings of blood chemistry: Secondary | ICD-10-CM

## 2015-08-06 DIAGNOSIS — Z0000 Encounter for general adult medical examination without abnormal findings: Secondary | ICD-10-CM | POA: Diagnosis not present

## 2015-08-06 DIAGNOSIS — Z01419 Encounter for gynecological examination (general) (routine) without abnormal findings: Secondary | ICD-10-CM

## 2015-08-06 LAB — BASIC METABOLIC PANEL
BUN: 20 mg/dL (ref 6–23)
CO2: 30 mEq/L (ref 19–32)
Calcium: 9.1 mg/dL (ref 8.4–10.5)
Chloride: 105 mEq/L (ref 96–112)
Creatinine, Ser: 0.57 mg/dL (ref 0.40–1.20)
GFR: 114.7 mL/min (ref >60.00)
Glucose, Bld: 85 mg/dL (ref 70–99)
Potassium: 3.8 mEq/L (ref 3.5–5.1)
Sodium: 140 mEq/L (ref 135–145)

## 2015-08-06 LAB — LIPID PANEL
Cholesterol: 174 mg/dL (ref 0–200)
HDL: 60.8 mg/dL (ref >39.00)
NonHDL: 113.17
Total CHOL/HDL Ratio: 3
Triglycerides: 210 mg/dL — ABNORMAL HIGH (ref 0.0–149.0)
VLDL: 42 mg/dL — ABNORMAL HIGH (ref 0.0–40.0)

## 2015-08-06 LAB — URINALYSIS, ROUTINE W REFLEX MICROSCOPIC
Bilirubin Urine: NEGATIVE
Hgb urine dipstick: NEGATIVE
Ketones, ur: NEGATIVE
Leukocytes, UA: NEGATIVE
Nitrite: NEGATIVE
Specific Gravity, Urine: 1.02 (ref 1.000–1.030)
Total Protein, Urine: NEGATIVE
Urine Glucose: NEGATIVE
Urobilinogen, UA: 0.2 (ref 0.0–1.0)
pH: 6.5 (ref 5.0–8.0)

## 2015-08-06 LAB — CBC WITH DIFFERENTIAL/PLATELET
Basophils Absolute: 0 10*3/uL (ref 0.0–0.1)
Basophils Relative: 0.7 % (ref 0.0–3.0)
Eosinophils Absolute: 0.3 10*3/uL (ref 0.0–0.7)
Eosinophils Relative: 4.4 % (ref 0.0–5.0)
HCT: 38.7 % (ref 36.0–46.0)
Hemoglobin: 12.9 g/dL (ref 12.0–15.0)
Lymphocytes Relative: 40.4 % (ref 12.0–46.0)
Lymphs Abs: 2.3 10*3/uL (ref 0.7–4.0)
MCHC: 33.4 g/dL (ref 30.0–36.0)
MCV: 94.2 fl (ref 78.0–100.0)
Monocytes Absolute: 0.4 10*3/uL (ref 0.1–1.0)
Monocytes Relative: 7.4 % (ref 3.0–12.0)
Neutro Abs: 2.7 10*3/uL (ref 1.4–7.7)
Neutrophils Relative %: 47.1 % (ref 43.0–77.0)
Platelets: 305 10*3/uL (ref 150.0–400.0)
RBC: 4.11 Mil/uL (ref 3.87–5.11)
RDW: 12.8 % (ref 11.5–15.5)
WBC: 5.8 10*3/uL (ref 4.0–10.5)

## 2015-08-06 LAB — HEPATIC FUNCTION PANEL
ALT: 15 U/L (ref 0–35)
AST: 17 U/L (ref 0–37)
Albumin: 4.2 g/dL (ref 3.5–5.2)
Alkaline Phosphatase: 72 U/L (ref 39–117)
Bilirubin, Direct: 0.1 mg/dL (ref 0.0–0.3)
Total Bilirubin: 0.4 mg/dL (ref 0.2–1.2)
Total Protein: 7 g/dL (ref 6.0–8.3)

## 2015-08-06 LAB — TSH: TSH: 1.15 u[IU]/mL (ref 0.35–4.50)

## 2015-08-06 LAB — LDL CHOLESTEROL, DIRECT: Direct LDL: 102 mg/dL

## 2015-08-06 NOTE — Assessment & Plan Note (Signed)
Immunizations reviewed- flu shot today.  Continue healthy diet, exercise.  Obtain routine labs.

## 2015-08-06 NOTE — Patient Instructions (Addendum)
Please complete lab work prior to leaving.  Follow up in 6 months.  

## 2015-08-06 NOTE — Progress Notes (Signed)
Pre visit review using our clinic review tool, if applicable. No additional management support is needed unless otherwise documented below in the visit note. 

## 2015-08-06 NOTE — Addendum Note (Signed)
Addended by: Kelle Darting A on: 08/06/2015 01:20 PM   Modules accepted: Orders

## 2015-08-06 NOTE — Progress Notes (Signed)
Subjective:    Patient ID: Melissa Grimes, female    DOB: 1956/08/22, 59 y.o.   MRN: [id number]  HPI   Patient presents today for complete physical.  Immunizations: tetanus 2014, flu today, shingles up to date Diet: reports healthy diet Exercise: rides her bike Colonoscopy: 2008- was told that she should not have another colonoscopy.  Clear per pt.  Dexa: 2014- dexa  Pap Smear: 9/14- normal- would like to complete Mammogram: due  Wt Readings from Last 3 Encounters:  08/06/15 153 lb (69.4 kg)  02/02/15 149 lb 12.8 oz (67.949 kg)  07/28/14 156 lb 6.4 oz (70.943 kg)     Review of Systems  Constitutional: Negative for unexpected weight change.  HENT: Negative for hearing loss and rhinorrhea.   Eyes: Negative for visual disturbance.  Respiratory: Negative for cough.   Cardiovascular: Negative for leg swelling.  Gastrointestinal: Negative for nausea, diarrhea and constipation.  Genitourinary: Negative for dysuria and frequency.       Sometimes feels like she does not fully empty bladder  Musculoskeletal: Negative for myalgias and arthralgias.  Skin: Negative for rash.  Neurological: Negative for headaches.  Hematological: Negative for adenopathy.  Psychiatric/Behavioral: Negative for dysphoric mood and agitation.   Past Medical History  Diagnosis Date  . Arthritis   . Hyperlipidemia   . Hypertension   . Complication of anesthesia     difficulty urinating after    Social History   Social History  . Marital Status: Married    Spouse Name: N/A  . Number of Children: 2  . Years of Education: N/A   Occupational History  . Not on file.   Social History Main Topics  . Smoking status: Never Smoker   . Smokeless tobacco: Never Used  . Alcohol Use: 1.8 oz/week    3 Glasses of wine per week  . Drug Use: No  . Sexual Activity: Not on file   Other Topics Concern  . 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 Osteotomy  1974 & 1975  . Cesarean section      x 2  . Bunionectomy    . Total hip arthroplasty Right 01/18/2013    Procedure: Right TOTAL HIP ARTHROPLASTY ANTERIOR APPROACH;  Surgeon: Mcarthur Rossetti, MD;  Location: [facility];  Service: Orthopedics;  Laterality: Right;  . Hernia repair Left 1977    inguinal  . Total hip arthroplasty Left 08/09/2013    Procedure: LEFT TOTAL HIP ARTHROPLASTY ANTERIOR APPROACH;  Surgeon: Mcarthur Rossetti, MD;  Location: [facility];  Service: Orthopedics;  Laterality: Left;    Family History  Problem Relation Age of Onset  . Arthritis Mother     osateoarthritis  . Hyperlipidemia Mother   . Heart disease Mother   . Hypertension Mother   . Hyperlipidemia Father   . Heart disease Father   . Stroke Father   . Parkinson's disease Father   . Cancer Maternal Grandfather     lung  . Cancer Cousin     breast    Allergies  Allergen Reactions  . Achromycin [Tetracycline] Hives    Current Outpatient Prescriptions on File Prior to Visit  Medication Sig Dispense Refill  . aspirin EC 81 MG tablet Take  81 mg by mouth daily.    . Calcium Citrate-Vitamin D 500-400 MG-UNIT CHEW Chew 2 tablets by mouth 2 (two) times daily.    . Lactobacillus (ACIDOPHILUS) 100 MG CAPS Take 100 mg by mouth daily.    Marland Kitchen lisinopril (PRINIVIL,ZESTRIL) 20 MG tablet TAKE 1 TABLET BY MOUTH EVERY DAY 90 tablet 1  . metoprolol succinate (TOPROL-XL) 25 MG 24 hr tablet Take 0.5 tablets (12.5 mg total) by mouth daily. 45 tablet 1  . Multiple Vitamins-Minerals (PRESERVISION AREDS 2 PO) Take 1 tablet by mouth 2 (two) times daily.    . pravastatin (PRAVACHOL) 20 MG tablet Take 1 tablet (20 mg total) by mouth daily. 90 tablet 1   No current facility-administered medications on file prior to visit.    BP 114/76 mmHg  Pulse 64   Temp(Src) 98.1 F (36.7 C) (Oral)  Resp 16  Wt 153 lb (69.4 kg)  SpO2 98%  LMP 11/17/2010      Objective:   Physical Exam  Physical Exam  Constitutional: She is oriented to person, place, and time. She appears well-developed and well-nourished. No distress.  HENT:  Head: Normocephalic and atraumatic.  Right Ear: Tympanic membrane and ear canal normal.  Left Ear: Tympanic membrane and ear canal normal.  Mouth/Throat: Oropharynx is clear and moist.  Eyes: Pupils are equal, round, and reactive to light. No scleral icterus.  Neck: Normal range of motion. No thyromegaly present.  Cardiovascular: Normal rate and regular rhythm.   No murmur heard. Pulmonary/Chest: Effort normal and breath sounds normal. No respiratory distress. He has no wheezes. She has no rales. She exhibits no tenderness.  Abdominal: Soft. Bowel sounds are normal. He exhibits no distension and no mass. There is no tenderness. There is no rebound and no guarding.  Musculoskeletal: She exhibits no edema.  Lymphadenopathy:    She has no cervical adenopathy.  Neurological: She is alert and oriented to person, place, and time. She exhibits normal muscle tone. Coordination normal.  Skin: Skin is warm and dry.  Psychiatric: She has a normal mood and affect. Her behavior is normal. Judgment and thought content normal.  Breasts: Examined lying Right: Without masses, retractions, discharge or axillary adenopathy.  Left: Without masses, retractions, discharge or axillary adenopathy.  Inguinal/mons: Normal without inguinal adenopathy  External genitalia: Normal  BUS/Urethra/Skene's glands: Normal  Bladder: Normal  Vagina: Normal appearing (+ fishy odor noted) Cervix: Normal  Uterus: normal in size, shape and contour. Midline and mobile  Adnexa/parametria:  Rt: Without masses or tenderness.  Lt: Without masses or tenderness.  Anus and perineum: Normal           Assessment & Plan:         Assessment & Plan:   EKG tracing is personally reviewed.  EKG notes NSR (sinus Loletha Grayer).  No acute changes.

## 2015-08-07 ENCOUNTER — Ambulatory Visit (HOSPITAL_BASED_OUTPATIENT_CLINIC_OR_DEPARTMENT_OTHER)
Admission: RE | Admit: 2015-08-07 | Discharge: 2015-08-07 | Disposition: A | Discharge status: Home / Self Care | Payer: BLUE CROSS/BLUE SHIELD | Source: Ambulatory Visit | Attending: Family | Admitting: Family

## 2015-08-07 DIAGNOSIS — R928 Other abnormal and inconclusive findings on diagnostic imaging of breast: Secondary | ICD-10-CM | POA: Insufficient documentation

## 2015-08-07 DIAGNOSIS — Z1231 Encounter for screening mammogram for malignant neoplasm of breast: Secondary | ICD-10-CM | POA: Diagnosis present

## 2015-08-07 DIAGNOSIS — Z0000 Encounter for general adult medical examination without abnormal findings: Secondary | ICD-10-CM

## 2015-08-07 LAB — CYTOLOGY - PAP

## 2015-08-07 LAB — HEPATITIS C ANTIBODY: HCV Ab: NEGATIVE

## 2015-08-08 ENCOUNTER — Telehealth: Payer: Self-pay | Admitting: Family

## 2015-08-08 DIAGNOSIS — R87619 Unspecified abnormal cytological findings in specimens from cervix uteri: Secondary | ICD-10-CM | POA: Insufficient documentation

## 2015-08-08 DIAGNOSIS — R87612 Low grade squamous intraepithelial lesion on cytologic smear of cervix (LGSIL): Secondary | ICD-10-CM

## 2015-08-08 LAB — CERVICOVAGINAL ANCILLARY ONLY

## 2015-08-08 MED ORDER — METRONIDAZOLE 500 MG PO TABS: 500.0000 mg | ORAL_TABLET | Freq: Two times a day (BID) | ORAL | Status: DC

## 2015-08-08 NOTE — Telephone Encounter (Signed)
Also, testing is + for Bacterial vaginosis.  Rx with metronidazole- not ETOH while on metronidazole.

## 2015-08-08 NOTE — Telephone Encounter (Signed)
Also- triglycerides are mildly elevated. Please work on avoiding concentrated sweets, and limiting white carbs (rice/bread/pasta/potatoes). Instead substitute whole grain versions with reasonable portions.  Other lab work looks good.

## 2015-08-08 NOTE — Telephone Encounter (Signed)
Please let pt know that her pap is abnormal and her HPV testing is positive.  I would like her to meet with GYN for further evaluation.

## 2015-08-08 NOTE — Telephone Encounter (Signed)
Notified pt and she voices understanding and will let us know if she is not contacted within 1 week re: GYN referral.

## 2015-08-08 NOTE — Telephone Encounter (Signed)
Left message for pt to return my call.

## 2015-08-09 ENCOUNTER — Telehealth: Payer: Self-pay | Admitting: Family

## 2015-08-09 NOTE — Telephone Encounter (Signed)
Please let pt know that the radiologist would like her to complete some additional breast images for further evaluation. Let me know if she has not been contacted by them about a follow up appointment in 1 week.   

## 2015-08-10 ENCOUNTER — Other Ambulatory Visit: Payer: Self-pay | Admitting: Family

## 2015-08-10 DIAGNOSIS — R928 Other abnormal and inconclusive findings on diagnostic imaging of breast: Secondary | ICD-10-CM

## 2015-08-10 NOTE — Telephone Encounter (Signed)
Pt is returning your call.    CB#: [phone]

## 2015-08-10 NOTE — Telephone Encounter (Signed)
Left a message for call back.  

## 2015-08-10 NOTE — Telephone Encounter (Signed)
Pt notified and made aware.  She stated understanding and said that she would call them to schedule another appointment.

## 2015-08-17 ENCOUNTER — Ambulatory Visit
Admission: RE | Admit: 2015-08-17 | Discharge: 2015-08-17 | Disposition: A | Discharge status: Home / Self Care | Payer: BLUE CROSS/BLUE SHIELD | Source: Ambulatory Visit | Attending: Family | Admitting: Family

## 2015-08-17 ENCOUNTER — Telehealth: Payer: Self-pay | Admitting: Family

## 2015-08-17 DIAGNOSIS — R928 Other abnormal and inconclusive findings on diagnostic imaging of breast: Secondary | ICD-10-CM

## 2015-08-17 DIAGNOSIS — Z113 Encounter for screening for infections with a predominantly sexual mode of transmission: Secondary | ICD-10-CM

## 2015-08-17 NOTE — Telephone Encounter (Signed)
Spoke with [facility] at [place]. She states that they received orders for Hep C and HIV antibody but they came through on separate orders and the HIV was not run. Pt has scheduled lab appt on Monday at 1:30pm.  Pt wants to know what other lab tests she can have to check for screening STDs as she states she is not symptomatic?  Please advise.

## 2015-08-17 NOTE — Telephone Encounter (Signed)
Relation to pt: self Call back number:[phone]   Reason for call:  Patient inquiring about lab results taken 08/06/2015

## 2015-08-17 NOTE — Telephone Encounter (Signed)
Left message for pt to return my call.

## 2015-08-17 NOTE — Telephone Encounter (Signed)
Pt is returning your call.   CB: [phone]

## 2015-08-20 ENCOUNTER — Other Ambulatory Visit (INDEPENDENT_AMBULATORY_CARE_PROVIDER_SITE_OTHER): Payer: BLUE CROSS/BLUE SHIELD

## 2015-08-20 DIAGNOSIS — Z113 Encounter for screening for infections with a predominantly sexual mode of transmission: Secondary | ICD-10-CM

## 2015-08-20 LAB — HIV ANTIBODY (ROUTINE TESTING W REFLEX): HIV 1&2 Ab, 4th Generation: NONREACTIVE

## 2015-08-21 LAB — HSV 2 ANTIBODY, IGG: HSV 2 Glycoprotein G Ab, IgG: <0.1 IV

## 2015-08-21 LAB — GC/CHLAMYDIA PROBE AMP, URINE
Chlamydia, Swab/Urine, PCR: NEGATIVE
GC Probe Amp, Urine: POSITIVE — AB

## 2015-08-21 LAB — SYPHILIS: RPR W/REFLEX TO RPR TITER AND TREPONEMAL ANTIBODIES, TRADITIONAL SCREENING AND DIAGNOSIS ALGORITHM

## 2015-08-21 LAB — HSV 1 ANTIBODY, IGG: HSV 1 Glycoprotein G Ab, IgG: 4.05 IV — ABNORMAL HIGH

## 2015-08-21 LAB — HSV(HERPES SIMPLEX VRS) I + II AB-IGM: Herpes Simplex Vrs I&II-IgM Ab (EIA): 1.39 {index} — ABNORMAL HIGH

## 2015-08-22 ENCOUNTER — Telehealth: Payer: Self-pay | Admitting: Family

## 2015-08-22 DIAGNOSIS — A64 Unspecified sexually transmitted disease: Secondary | ICD-10-CM

## 2015-08-22 NOTE — Telephone Encounter (Signed)
Please contact patient and let her know that her lab work tested positive for gonorrhea.  I recommend that she schedule a nurse visit for the following:  Ceftriaxone 250mg  IM + zithromax 1 gm (1000mg ) PO x 1.  If we do not have zithromax available in the clinic, please send two 500mg  tabs to her pharmacy and have her bring with her to take in the office.  She will need to notify sexual partners and they will need to be treated as well.

## 2015-08-22 NOTE — Telephone Encounter (Signed)
Also, HIV, syphilis, negative.  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  HSV2 is negative, but I would recommend that she repeat HSV testing for type 2 in 6 weeks (HSV2 IgG) to confirm.

## 2015-08-24 ENCOUNTER — Telehealth: Payer: Self-pay

## 2015-08-24 ENCOUNTER — Ambulatory Visit (INDEPENDENT_AMBULATORY_CARE_PROVIDER_SITE_OTHER): Payer: BLUE CROSS/BLUE SHIELD

## 2015-08-24 DIAGNOSIS — A549 Gonococcal infection, unspecified: Secondary | ICD-10-CM | POA: Diagnosis not present

## 2015-08-24 DIAGNOSIS — A609 Anogenital herpesviral infection, unspecified: Secondary | ICD-10-CM | POA: Diagnosis not present

## 2015-08-24 MED ORDER — AZITHROMYCIN 1 G PO PACK: 1.0000 g | PACK | Freq: Once | ORAL | Status: DC

## 2015-08-24 MED ORDER — CEFTRIAXONE SODIUM 1 G IJ SOLR
250.0000 mg | Freq: Once | INTRAMUSCULAR | Status: AC
  Administered 2015-08-24: 250 mg via INTRAMUSCULAR

## 2015-08-24 NOTE — Telephone Encounter (Signed)
Patient came in office for medication administration. [facility]( [facility] Given Rocephn 250mg  IM and Azithromycin oral suspension 1 gm powder that was mixed with water per directions. Patient tolerated well. Advised patient per Elvera Maria, that she will need to notify sexual partners and they will need to be tested as well. Patient agreed.

## 2015-08-24 NOTE — Telephone Encounter (Signed)
Patient in for ABO administration. Tolerated well. See full telephone note.

## 2015-08-24 NOTE — Telephone Encounter (Signed)
Left message for CB 08/23/2015  Left additional message this morning. 08/24/2015

## 2015-08-24 NOTE — Telephone Encounter (Signed)
Spoke with patient and advised per recommendations. Sent oral med to [facility] for patient to bring with her to inj appt. Double booked 9am slot. Please treat whenever patient arrives today.

## 2015-08-24 NOTE — Progress Notes (Signed)
Reviewed.  Santiago Glad, could you please change diagnosis from HSV to Gonorrhea? thanks

## 2015-09-07 ENCOUNTER — Telehealth: Payer: Self-pay | Admitting: Family

## 2015-09-07 NOTE — Telephone Encounter (Signed)
Pt is stating that she wants to be retested for Gonorrhea. She said she was treated and thinks it was a false positive. She wants to come first thing Monday morning before her appt at the new GYN office across the hall. Please call pt at [phone] and enter for repeat specimen as needed.

## 2015-09-10 ENCOUNTER — Ambulatory Visit (INDEPENDENT_AMBULATORY_CARE_PROVIDER_SITE_OTHER): Payer: BLUE CROSS/BLUE SHIELD | Admitting: Family

## 2015-09-10 ENCOUNTER — Encounter: Payer: Self-pay | Admitting: Obstetrics & Gynecology

## 2015-09-10 ENCOUNTER — Other Ambulatory Visit (HOSPITAL_COMMUNITY)
Admission: RE | Admit: 2015-09-10 | Discharge: 2015-09-10 | Disposition: A | Discharge status: Home / Self Care | Payer: BLUE CROSS/BLUE SHIELD | Source: Ambulatory Visit | Attending: Obstetrics & Gynecology | Admitting: Obstetrics & Gynecology

## 2015-09-10 ENCOUNTER — Ambulatory Visit (INDEPENDENT_AMBULATORY_CARE_PROVIDER_SITE_OTHER): Payer: BLUE CROSS/BLUE SHIELD | Admitting: Obstetrics & Gynecology

## 2015-09-10 VITALS — BP 115/57 | HR 65 | Ht 63.0 in | Wt 150.0 lb

## 2015-09-10 DIAGNOSIS — R87810 Cervical high risk human papillomavirus (HPV) DNA test positive: Secondary | ICD-10-CM | POA: Diagnosis not present

## 2015-09-10 DIAGNOSIS — R87619 Unspecified abnormal cytological findings in specimens from cervix uteri: Secondary | ICD-10-CM | POA: Insufficient documentation

## 2015-09-10 DIAGNOSIS — IMO0002 Reserved for concepts with insufficient information to code with codable children: Secondary | ICD-10-CM

## 2015-09-10 DIAGNOSIS — Z113 Encounter for screening for infections with a predominantly sexual mode of transmission: Secondary | ICD-10-CM | POA: Diagnosis not present

## 2015-09-10 DIAGNOSIS — I10 Essential (primary) hypertension: Secondary | ICD-10-CM

## 2015-09-10 DIAGNOSIS — R87612 Low grade squamous intraepithelial lesion on cytologic smear of cervix (LGSIL): Secondary | ICD-10-CM | POA: Diagnosis not present

## 2015-09-10 NOTE — Progress Notes (Signed)
   Subjective:    Patient ID: Melissa Grimes, female    DOB: May 29, 1956, 59 y.o.   MRN: [id number]  HPI  59 yo MW woman here with LGSIL pap. She also was treated for +GC recently. Her husband and 2 other boyfriends tested negative reportedly but they were all treated.  Review of Systems     Objective:   Physical Exam [id number] UPT negative, consent signed, time out done Cervix prepped with acetic acid. Transformation zone seen in its entirety. Colpo adequate. ECC obtained. She tolerated the procedure well.         Assessment & Plan:  LGSIL pap, normal colpo Await ECC TOC for GC/CT done today

## 2015-09-10 NOTE — Progress Notes (Signed)
   Subjective:    Patient ID: Melissa Grimes, female    DOB: 14-Dec-1955, 59 y.o.   MRN: [id number]  HPI   Review of Systems    Objective:   Physical Exam        Assessment & Plan:  Pt not seen.

## 2015-09-10 NOTE — Telephone Encounter (Signed)
I see this was completed by her GYN.

## 2015-09-13 ENCOUNTER — Telehealth: Payer: Self-pay | Admitting: Family

## 2015-09-13 LAB — GC/CHLAMYDIA PROBE AMP
CT Probe RNA: NEGATIVE
GC Probe RNA: NEGATIVE

## 2015-09-13 NOTE — Telephone Encounter (Signed)
Caller name:Stephanie from [facility] back number: [phone]   Reason for call:  [facility] inquiring if patient has been treated for STD. Please advise

## 2015-09-13 NOTE — Telephone Encounter (Signed)
Spoke with Colletta [place] and advised her that pt was treated at GYN. She will try to get in touch with GYN to confirm antibiotic given.

## 2015-09-13 NOTE — Telephone Encounter (Signed)
Left message for Stephanie to call back. 

## 2015-10-23 ENCOUNTER — Encounter: Payer: Self-pay | Admitting: Obstetrics & Gynecology

## 2015-11-21 ENCOUNTER — Ambulatory Visit (INDEPENDENT_AMBULATORY_CARE_PROVIDER_SITE_OTHER): Payer: BLUE CROSS/BLUE SHIELD | Admitting: Internal Medicine

## 2015-11-21 ENCOUNTER — Encounter: Payer: Self-pay | Admitting: Internal Medicine

## 2015-11-21 VITALS — BP 104/68 | HR 65 | Temp 97.8°F | Ht 63.0 in | Wt 148.4 lb

## 2015-11-21 DIAGNOSIS — J069 Acute upper respiratory infection, unspecified: Secondary | ICD-10-CM | POA: Diagnosis not present

## 2015-11-21 DIAGNOSIS — E041 Nontoxic single thyroid nodule: Secondary | ICD-10-CM | POA: Diagnosis not present

## 2015-11-21 MED ORDER — AZELASTINE HCL 0.1 % NA SOLN: 2.0000 | Freq: Every evening | NASAL | Status: DC | PRN

## 2015-11-21 MED ORDER — AMOXICILLIN 500 MG PO CAPS: 1000.0000 mg | ORAL_CAPSULE | Freq: Two times a day (BID) | ORAL | Status: DC

## 2015-11-21 NOTE — Patient Instructions (Signed)
Rest, fluids , tylenol  For cough:  Take Mucinex DM twice a day as needed until better  For nasal congestion: Use OTC Nasocort or Flonase : 2 nasal sprays on each side of the nose in the morning until you feel better Use ASTELIN a prescribed spray : 2 nasal sprays on each side of the nose at night until you feel better   Avoid decongestants such as  Pseudoephedrine or phenylephrine    Take the antibiotic as prescribed  (Amoxicillin)  Call if not gradually better over the next  10 days  Call anytime if the symptoms are severe  ---- Also, wil schedule ultrasound of thyroid. Please come back in 1 months to see your primary doctor   for a checkup of the thyroid problem

## 2015-11-21 NOTE — Progress Notes (Signed)
Pre visit review using our clinic review tool, if applicable. No additional management support is needed unless otherwise documented below in the visit note. 

## 2015-11-21 NOTE — Progress Notes (Signed)
Subjective:    Patient ID: Melissa Grimes, female    DOB: [birth year], 60 y.o.   MRN: [id number]  DOS:  11/21/2015 Type of visit - description :  Acute visit  Interval history: Sx started ~ 11-18-15: Developed a sinus congestion, pain, postnasal dripping. Symptoms have decreased to some extent but interestingly has developed a severe pain at the right anterior area of the neck. The pain is more when she moves certain ways and does not change when she swallows.   Review of Systems Denies chills, + subjective fever No nausea or vomiting. Had some myalgias with the onset of symptoms. Minimal cough, no chest congestion.  Past Medical History  Diagnosis Date  . Arthritis   . Hyperlipidemia   . Hypertension   . Complication of anesthesia     difficulty urinating after    Past Surgical History  Procedure Laterality Date  . Osteotomy  1974 & 1975  . Cesarean section      x 2  . Bunionectomy    . Total hip arthroplasty Right 01/18/2013    Procedure: Right TOTAL HIP ARTHROPLASTY ANTERIOR APPROACH;  Surgeon: Mcarthur Rossetti, MD;  Location: [facility];  Service: Orthopedics;  Laterality: Right;  . Hernia repair Left 1977    inguinal  . Total hip arthroplasty Left 08/09/2013    Procedure: LEFT TOTAL HIP ARTHROPLASTY ANTERIOR APPROACH;  Surgeon: Mcarthur Rossetti, MD;  Location: [facility];  Service: Orthopedics;  Laterality: Left;    Social History   Social History  . Marital Status: Married    Spouse Name: N/A  . Number of Children: 2  . Years of Education: N/A   Occupational History  . Not on file.   Social History Main Topics  . Smoking status: Never Smoker   . Smokeless tobacco: Never Used  . Alcohol Use: 1.8 oz/week    3 Glasses of wine per week  . Drug Use: No  . Sexual Activity: Yes   Other Topics Concern  . Not on file   Social History Narrative   Corporate treasurer   Completed 2 yrs college   Married- from [place]   2 children- grown both in [place]   Enjoys writing christian ed material   1 cat        Medication List       This list is accurate as of: 11/21/15  6:41 PM.  Always use your most recent med list.               Acidophilus 100 MG Caps  Take 100 mg by mouth daily.     amoxicillin 500 MG capsule  Commonly known as:  AMOXIL  Take 2 capsules (1,000 mg total) by mouth 2 (two) times daily.     aspirin EC 81 MG tablet  Take 81 mg by mouth daily.     azelastine 0.1 % nasal spray  Commonly known as:  ASTELIN  Place 2 sprays into both nostrils at bedtime as needed for rhinitis. Use in each nostril as directed     calcium citrate-vitamin D 500-400 MG-UNIT chewable tablet  Chew 2 tablets by mouth 2 (two) times daily.     lisinopril 20 MG tablet  Commonly known as:  PRINIVIL,ZESTRIL  TAKE 1 TABLET BY MOUTH EVERY DAY     metoprolol succinate 25 MG 24 hr tablet  Commonly known as:  TOPROL-XL  Take 0.5 tablets (12.5 mg total) by mouth daily.     pravastatin 20 MG tablet  Commonly known as:  PRAVACHOL  Take 1 tablet (20 mg total) by mouth daily.     PRESERVISION AREDS 2 PO  Take 1 tablet by mouth 2 (two) times daily.           Objective:   Physical Exam  Neck:     BP 104/68 mmHg  Pulse 65  Temp(Src) 97.8 F (36.6 C) (Oral)  Ht 5\' 3"  (1.6 m)  Wt 148 lb 6.4 oz (67.314 kg)  BMI 26.29 kg/m2  SpO2 98%  LMP 11/17/2010 General:   Well developed, well nourished . NAD.  HEENT:  Normocephalic . Face symmetric, atraumatic. Nose: Congested. Sinuses no TTP Throat: No red, tonsils normal, uvula midline, no discharge TMs: L  Normal, R TM: Red, slightly bulge, + fluid level. Lungs:  CTA B Normal respiratory effort, no intercostal retractions, no accessory muscle use. Heart: RRR,  no murmur.  No pretibial edema bilaterally  Skin: Not pale. Not jaundice Neurologic:  alert & oriented X3.  Speech normal, gait appropriate for age and unassisted Psych--  Cognition and judgment appear intact.  Cooperative with  normal attention span and concentration.  Behavior appropriate. No anxious or depressed appearing.      Assessment & Plan:   URI, otitis media: Treat with amoxicillin, Flonase and Astelin. Thyroid nodule: C/o  pain at the anterior neck, physical exam and consistent with a thyroid nodule, I'm not sure if this is related with the URI. Will get a ultrasound, consider TFTs and sedimentation rate. Further advice would results Recommend to see primary doctor in one month

## 2015-11-22 ENCOUNTER — Ambulatory Visit (HOSPITAL_BASED_OUTPATIENT_CLINIC_OR_DEPARTMENT_OTHER)
Admission: RE | Admit: 2015-11-22 | Discharge: 2015-11-22 | Disposition: A | Discharge status: Home / Self Care | Payer: BLUE CROSS/BLUE SHIELD | Source: Ambulatory Visit | Attending: Internal Medicine | Admitting: Internal Medicine

## 2015-11-22 DIAGNOSIS — E042 Nontoxic multinodular goiter: Secondary | ICD-10-CM | POA: Insufficient documentation

## 2015-11-22 DIAGNOSIS — E041 Nontoxic single thyroid nodule: Secondary | ICD-10-CM | POA: Diagnosis present

## 2015-11-26 NOTE — Addendum Note (Signed)
Addended byDamita Dunnings D on: 11/26/2015 12:25 PM   Modules accepted: Orders

## 2015-12-12 ENCOUNTER — Ambulatory Visit
Admission: RE | Admit: 2015-12-12 | Discharge: 2015-12-12 | Disposition: A | Discharge status: Home / Self Care | Payer: BLUE CROSS/BLUE SHIELD | Source: Ambulatory Visit | Attending: Internal Medicine | Admitting: Internal Medicine

## 2015-12-12 ENCOUNTER — Other Ambulatory Visit (HOSPITAL_COMMUNITY)
Admission: RE | Admit: 2015-12-12 | Discharge: 2015-12-12 | Disposition: A | Discharge status: Home / Self Care | Payer: BLUE CROSS/BLUE SHIELD | Source: Ambulatory Visit | Attending: Interventional Radiology | Admitting: Interventional Radiology

## 2015-12-12 DIAGNOSIS — E041 Nontoxic single thyroid nodule: Secondary | ICD-10-CM | POA: Insufficient documentation

## 2015-12-26 ENCOUNTER — Telehealth: Payer: Self-pay | Admitting: Family

## 2015-12-26 ENCOUNTER — Ambulatory Visit (INDEPENDENT_AMBULATORY_CARE_PROVIDER_SITE_OTHER): Payer: BLUE CROSS/BLUE SHIELD | Admitting: Family

## 2015-12-26 ENCOUNTER — Encounter: Payer: Self-pay | Admitting: Family

## 2015-12-26 VITALS — BP 129/82 | HR 59 | Temp 97.6°F | Resp 16 | Ht 63.0 in | Wt 146.6 lb

## 2015-12-26 DIAGNOSIS — I10 Essential (primary) hypertension: Secondary | ICD-10-CM

## 2015-12-26 DIAGNOSIS — E042 Nontoxic multinodular goiter: Secondary | ICD-10-CM | POA: Diagnosis not present

## 2015-12-26 DIAGNOSIS — E785 Hyperlipidemia, unspecified: Secondary | ICD-10-CM | POA: Diagnosis not present

## 2015-12-26 NOTE — Progress Notes (Signed)
Pre visit review using our clinic review tool, if applicable. No additional management support is needed unless otherwise documented below in the visit note. 

## 2015-12-26 NOTE — Assessment & Plan Note (Signed)
Maintained on pravastatin.  Continue same. Trigs were elevated, she will return to the lab fasting for lab draw.

## 2015-12-26 NOTE — Patient Instructions (Addendum)
Please schedule a fasting lab appointment at the front desk.   Follow up in 6 months.

## 2015-12-26 NOTE — Progress Notes (Signed)
Subjective:    Patient ID: Melissa Grimes, female    DOB: 1955-12-03, 60 y.o.   MRN: [id number]  HPI  Melissa Grimes is a 60 yr old female who presents today for routine follow up.  1) HTN- currently maintained on lisinopril 20mg .   BP Readings from Last 3 Encounters:  12/26/15 129/82  11/21/15 104/68  09/10/15 115/57   2) Hyperlipidemia- maintained on pravastatin.   Lab Results  Component Value Date   CHOL 174 08/06/2015   HDL 60.80 08/06/2015   LDLCALC 103* 08/01/2014   LDLDIRECT 102.0 08/06/2015   TRIG 210.0* 08/06/2015   CHOLHDL 3 08/06/2015   3) Multinodular goiter- saw Dr. Larose Kells last month and c/o pain anterior neck.  She had thyroid US which noted multinodular goiter. She was subsequently referred for   Review of Systems  Respiratory: Negative for shortness of breath.   Cardiovascular: Negative for chest pain and leg swelling.  Musculoskeletal: Negative for myalgias.      see HPI  Past Medical History  Diagnosis Date  . Arthritis   . Hyperlipidemia   . Hypertension   . Complication of anesthesia     difficulty urinating after    Social History   Social History  . Marital Status: Married    Spouse Name: N/A  . Number of Children: 2  . Years of Education: N/A   Occupational History  . Not on file.   Social History Main Topics  . Smoking status: Never Smoker   . Smokeless tobacco: Never Used  . Alcohol Use: 1.8 oz/week    3 Glasses of wine per week  . Drug Use: No  . Sexual Activity: Yes   Other Topics Concern  . 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 Osteotomy  1974 & 1975  . Cesarean section      x 2  . Bunionectomy    . Total hip arthroplasty Right 01/18/2013    Procedure: Right TOTAL HIP ARTHROPLASTY ANTERIOR APPROACH;   Surgeon: Mcarthur Rossetti, MD;  Location: [facility];  Service: Orthopedics;  Laterality: Right;  . Hernia repair Left 1977    inguinal  . Total hip arthroplasty Left 08/09/2013    Procedure: LEFT TOTAL HIP ARTHROPLASTY ANTERIOR APPROACH;  Surgeon: Mcarthur Rossetti, MD;  Location: [facility];  Service: Orthopedics;  Laterality: Left;    Family History  Problem Relation Age of Onset  . Arthritis Mother     osateoarthritis  . Hyperlipidemia Mother   . Heart disease Mother   . Hypertension Mother   . Hyperlipidemia Father   . Heart disease Father   . Stroke Father   . Parkinson's disease Father   . Cancer Maternal Grandfather     lung  . Cancer Cousin     breast    Allergies  Allergen Reactions  . Achromycin [Tetracycline] Hives    Current Outpatient Prescriptions on File Prior to Visit  Medication Sig Dispense Refill  . aspirin EC 81 MG tablet Take 81 mg by mouth daily.    . Calcium Citrate-Vitamin D 500-400 MG-UNIT CHEW Chew 2 tablets by mouth 2 (two) times daily.    . Lactobacillus (ACIDOPHILUS) 100 MG CAPS Take 100 mg by mouth daily.    Marland Kitchen  lisinopril (PRINIVIL,ZESTRIL) 20 MG tablet TAKE 1 TABLET BY MOUTH EVERY DAY 90 tablet 1  . metoprolol succinate (TOPROL-XL) 25 MG 24 hr tablet Take 0.5 tablets (12.5 mg total) by mouth daily. 45 tablet 1  . Multiple Vitamins-Minerals (PRESERVISION AREDS 2 PO) Take 1 tablet by mouth 2 (two) times daily.    . pravastatin (PRAVACHOL) 20 MG tablet Take 1 tablet (20 mg total) by mouth daily. 90 tablet 1   No current facility-administered medications on file prior to visit.    BP 129/82 mmHg  Pulse 59  Temp(Src) 97.6 F (36.4 C) (Oral)  Resp 16  Ht 5\' 3"  (1.6 m)  Wt 146 lb 9.6 oz (66.497 kg)  BMI 25.98 kg/m2  SpO2 100%  LMP 11/17/2010    Objective:   Physical Exam  Constitutional: She is oriented to person, place, and time. She appears well-developed and well-nourished.  Cardiovascular: Normal rate, regular rhythm and normal  heart sounds.   No murmur heard. Pulmonary/Chest: Effort normal and breath sounds normal. No respiratory distress. She has no wheezes.  Musculoskeletal: She exhibits no edema.  Neurological: She is alert and oriented to person, place, and time.  Psychiatric: She has a normal mood and affect. Her behavior is normal. Judgment and thought content normal.          Assessment & Plan:

## 2015-12-26 NOTE — Assessment & Plan Note (Signed)
BP is stable on current medication.

## 2015-12-26 NOTE — Telephone Encounter (Signed)
See mychart.  

## 2015-12-26 NOTE — Assessment & Plan Note (Signed)
Obtain TFT's.  Cytology of right nodule benign. Will refer to endo for further evaluation.  Not certain if other nodules should be biopsied- will defer to endo.

## 2015-12-31 ENCOUNTER — Telehealth: Payer: Self-pay | Admitting: Family

## 2015-12-31 NOTE — Telephone Encounter (Signed)
See mychart.  

## 2016-01-09 ENCOUNTER — Telehealth: Payer: Self-pay | Admitting: Family

## 2016-01-09 NOTE — Telephone Encounter (Signed)
Could you please contact pt re: unread mychart message and advise her to contact endo to schedule an appointment?

## 2016-01-09 NOTE — Telephone Encounter (Signed)
Left detailed message on cell# re: 2/8 and 2/13 mychart messages and to call and schedule lab appt for pending labs from 12/26/15. Also left endocrinology phone # for her to contact them to arrange appt as they have left her a couple of messages to schedule consultation and to call if any quesitons.

## 2016-01-11 ENCOUNTER — Other Ambulatory Visit: Payer: BLUE CROSS/BLUE SHIELD

## 2016-01-11 LAB — BASIC METABOLIC PANEL
BUN: 20 mg/dL (ref 6–23)
CO2: 29 mEq/L (ref 19–32)
Calcium: 9 mg/dL (ref 8.4–10.5)
Chloride: 104 mEq/L (ref 96–112)
Creatinine, Ser: 0.66 mg/dL (ref 0.40–1.20)
GFR: 96.71 mL/min (ref >60.00)
Glucose, Bld: 96 mg/dL (ref 70–99)
Potassium: 4 mEq/L (ref 3.5–5.1)
Sodium: 139 mEq/L (ref 135–145)

## 2016-01-11 LAB — LIPID PANEL
Cholesterol: 204 mg/dL — ABNORMAL HIGH (ref 0–200)
HDL: 71.7 mg/dL (ref >39.00)
LDL Cholesterol: 113 mg/dL — ABNORMAL HIGH (ref 0–99)
NonHDL: 132.58
Total CHOL/HDL Ratio: 3
Triglycerides: 99 mg/dL (ref 0.0–149.0)
VLDL: 19.8 mg/dL (ref 0.0–40.0)

## 2016-01-11 LAB — TSH: TSH: 2 u[IU]/mL (ref 0.35–4.50)

## 2016-01-11 LAB — T4, FREE: Free T4: 0.78 ng/dL (ref 0.60–1.60)

## 2016-01-11 LAB — T3, FREE: T3, Free: 3.1 pg/mL (ref 2.3–4.2)

## 2016-01-16 ENCOUNTER — Telehealth: Payer: Self-pay | Admitting: Family

## 2016-01-16 NOTE — Telephone Encounter (Signed)
Relation to [id number] Call back number:[phone]   Reason for call:  Patient states she cancel endocrinologist appointment due to specialist not being able to explain what the initial new patient appointment would consist of patient would like to know

## 2016-01-16 NOTE — Telephone Encounter (Signed)
Spoke with pt, and advised her per 12/26/15 office note that PCP was uncertain if additional thyroid nodules should be biposied and she wants recommendation from endocrinologist. Pt states she will keep appt that she has arranged with them.

## 2016-01-23 ENCOUNTER — Other Ambulatory Visit: Payer: Self-pay | Admitting: Family

## 2016-01-23 NOTE — Telephone Encounter (Signed)
Request refill on lisinopril (PRINIVIL,ZESTRIL) 20 MG tablet [id number] be sent to  [id number] - [place], [place] [phone] (Phone) [phone] (Fax)       So that she can have it transferred over to [facility] when she gets there. States that she will not have enough medication to last her during her trip but it is too early for a refill. [facility] [place] [facility] it

## 2016-01-23 NOTE — Telephone Encounter (Signed)
Caller name: Self  Can be [phone]  Pharmacy:  Reason for call:

## 2016-01-24 MED ORDER — LISINOPRIL 20 MG PO TABS: 20.0000 mg | ORAL_TABLET | Freq: Every day | ORAL | Status: DC

## 2016-01-24 NOTE — Telephone Encounter (Signed)
Rx sent 

## 2016-01-28 ENCOUNTER — Ambulatory Visit: Payer: BLUE CROSS/BLUE SHIELD | Admitting: Endocrinology

## 2016-01-28 ENCOUNTER — Other Ambulatory Visit: Payer: Self-pay | Admitting: Family

## 2016-01-28 NOTE — Telephone Encounter (Signed)
Medication filled to pharmacy as requested.   

## 2016-01-31 ENCOUNTER — Encounter (INDEPENDENT_AMBULATORY_CARE_PROVIDER_SITE_OTHER): Payer: Self-pay | Admitting: Ophthalmology

## 2016-02-04 ENCOUNTER — Ambulatory Visit: Payer: BLUE CROSS/BLUE SHIELD | Admitting: Family

## 2016-04-02 ENCOUNTER — Encounter: Payer: Self-pay | Admitting: Family

## 2016-04-02 ENCOUNTER — Ambulatory Visit (INDEPENDENT_AMBULATORY_CARE_PROVIDER_SITE_OTHER): Payer: BLUE CROSS/BLUE SHIELD | Admitting: Family

## 2016-04-02 ENCOUNTER — Other Ambulatory Visit: Payer: Self-pay | Admitting: Family

## 2016-04-02 VITALS — BP 141/77 | HR 59 | Temp 97.7°F | Resp 20 | Ht 63.0 in | Wt 144.8 lb

## 2016-04-02 DIAGNOSIS — Z113 Encounter for screening for infections with a predominantly sexual mode of transmission: Secondary | ICD-10-CM

## 2016-04-02 DIAGNOSIS — E042 Nontoxic multinodular goiter: Secondary | ICD-10-CM

## 2016-04-02 DIAGNOSIS — E785 Hyperlipidemia, unspecified: Secondary | ICD-10-CM

## 2016-04-02 DIAGNOSIS — I10 Essential (primary) hypertension: Secondary | ICD-10-CM | POA: Diagnosis not present

## 2016-04-02 DIAGNOSIS — J309 Allergic rhinitis, unspecified: Secondary | ICD-10-CM

## 2016-04-02 MED ORDER — FLUTICASONE PROPIONATE 50 MCG/ACT NA SUSP: 2.0000 | Freq: Every day | NASAL | Status: DC

## 2016-04-02 NOTE — Progress Notes (Signed)
Pre visit review using our clinic review tool, if applicable. No additional management support is needed unless otherwise documented below in the visit note. 

## 2016-04-02 NOTE — Assessment & Plan Note (Signed)
BP mildly elevated.  Continue current meds.

## 2016-04-02 NOTE — Assessment & Plan Note (Signed)
Tolerating statin,  Obtain follow up lipid panel.

## 2016-04-02 NOTE — Assessment & Plan Note (Signed)
Advised pt to reschedule endocrinology consult.

## 2016-04-02 NOTE — Progress Notes (Signed)
Subjective:    Patient ID: Melissa Grimes, female    DOB: 1955/12/24, 60 y.o.   MRN: [id number]  HPI  [name] is a 60 yr old female who presents today for follow up.  1) HTN-  Pt is maintained on lisinopril, metoprolol. BP Readings from Last 3 Encounters:  04/02/16 141/77  12/26/15 129/82  11/21/15 104/68   2) Thyroid nodule-  Pt did not follow up with endocrinology.  3) Rhinorrhea- reports ongoing post-nasal drip, worst in the AM.  Takes claritin and mucinex.    4) hyperlipidemia- on statin, denies myalgia   Review of Systems See HPI  Past Medical History  Diagnosis Date  . Arthritis   . Hyperlipidemia   . Hypertension   . Complication of anesthesia     difficulty urinating after     Social History   Social History  . Marital Status: Married    Spouse Name: N/A  . Number of Children: 2  . Years of Education: N/A   Occupational History  . Not on file.   Social History Main Topics  . Smoking status: Never Smoker   . Smokeless tobacco: Never Used  . Alcohol Use: 1.8 oz/week    3 Glasses of wine per week  . Drug Use: No  . Sexual Activity: Yes   Other Topics Concern  . 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 Osteotomy  1974 & 1975  . Cesarean section      x 2  . Bunionectomy    . Total hip arthroplasty Right 01/18/2013    Procedure: Right TOTAL HIP ARTHROPLASTY ANTERIOR APPROACH;  Surgeon: Mcarthur Rossetti, MD;  Location: [facility];  Service: Orthopedics;  Laterality: Right;  . Hernia repair Left 1977    inguinal  . Total hip arthroplasty Left 08/09/2013    Procedure: LEFT TOTAL HIP ARTHROPLASTY ANTERIOR APPROACH;  Surgeon: Mcarthur Rossetti, MD;  Location: [facility];  Service: Orthopedics;  Laterality: Left;    Family History    Problem Relation Age of Onset  . Arthritis Mother     osateoarthritis  . Hyperlipidemia Mother   . Heart disease Mother   . Hypertension Mother   . Hyperlipidemia Father   . Heart disease Father   . Stroke Father   . Parkinson's disease Father   . Cancer Maternal Grandfather     lung  . Cancer Cousin     breast    Allergies  Allergen Reactions  . Achromycin [Tetracycline] Hives    Current Outpatient Prescriptions on File Prior to Visit  Medication Sig Dispense Refill  . aspirin EC 81 MG tablet Take 81 mg by mouth daily.    . Calcium Citrate-Vitamin D 500-400 MG-UNIT CHEW Chew 2 tablets by mouth 2 (two) times daily.    . Lactobacillus (ACIDOPHILUS) 100 MG CAPS Take 100 mg by mouth daily.    Marland Kitchen lisinopril (PRINIVIL,ZESTRIL) 20 MG tablet Take 1 tablet (20 mg total) by mouth daily. 90 tablet 0  . metoprolol succinate (TOPROL-XL) 25 MG 24 hr tablet TAKE 0.5 TABLETS (12.5 MG TOTAL) BY MOUTH DAILY. 45 tablet 1  . Multiple Vitamins-Minerals (PRESERVISION AREDS 2 PO) Take 1 tablet by mouth 2 (two) times daily.    Marland Kitchen  Omega-3 Fatty Acids (FISH OIL) 1000 MG CAPS Take 1 capsule by mouth daily.    . pravastatin (PRAVACHOL) 20 MG tablet TAKE 1 TABLET (20 MG TOTAL) BY MOUTH DAILY. 90 tablet 1   No current facility-administered medications on file prior to visit.    BP 141/77 mmHg  Pulse 59  Temp(Src) 97.7 F (36.5 C) (Oral)  Resp 20  Ht 5\' 3"  (1.6 m)  Wt 144 lb 12.8 oz (65.681 kg)  BMI 25.66 kg/m2  SpO2 100%  LMP 11/17/2010       Objective:   Physical Exam  Constitutional: She appears well-developed and well-nourished.  HENT:  Head: Normocephalic and atraumatic.  Right Ear: Tympanic membrane and ear canal normal.  Left Ear: Tympanic membrane and ear canal normal.  Cardiovascular: Normal rate, regular rhythm and normal heart sounds.   No murmur heard. Pulmonary/Chest: Effort normal and breath sounds normal. No respiratory distress. She has no wheezes.  Psychiatric: She has a  normal mood and affect. Her behavior is normal. Judgment and thought content normal.          Assessment & Plan:

## 2016-04-02 NOTE — Assessment & Plan Note (Signed)
Uncontrolled. Continue claritin, add flonase.

## 2016-04-02 NOTE — Patient Instructions (Addendum)
Please call endocrinology to reschedule visit. [id number]. Complete lab work prior to leaving.

## 2016-04-03 ENCOUNTER — Encounter: Payer: Self-pay | Admitting: Family

## 2016-04-03 LAB — LIPID PANEL
Cholesterol: 193 mg/dL (ref 0–200)
HDL: 64.5 mg/dL (ref >39.00)
LDL Cholesterol: 108 mg/dL — ABNORMAL HIGH (ref 0–99)
NonHDL: 128.67
Total CHOL/HDL Ratio: 3
Triglycerides: 105 mg/dL (ref 0.0–149.0)
VLDL: 21 mg/dL (ref 0.0–40.0)

## 2016-04-03 LAB — RPR

## 2016-04-03 LAB — HSV(HERPES SIMPLEX VRS) I + II AB-IGM: Herpes Simplex Vrs I&II-IgM Ab (EIA): 0.84 INDEX

## 2016-04-03 LAB — HIV ANTIBODY (ROUTINE TESTING W REFLEX): HIV 1&2 Ab, 4th Generation: NONREACTIVE

## 2016-04-03 LAB — GC/CHLAMYDIA PROBE AMP
CT Probe RNA: NOT DETECTED
GC Probe RNA: NOT DETECTED

## 2016-04-03 LAB — HSV 2 ANTIBODY, IGG: HSV 2 Glycoprotein G Ab, IgG: <0.9 Index (ref <0.90)

## 2016-04-03 LAB — HSV 1 ANTIBODY, IGG: HSV 1 Glycoprotein G Ab, IgG: 17.3 Index — ABNORMAL HIGH (ref <0.90)

## 2016-05-11 ENCOUNTER — Other Ambulatory Visit: Payer: Self-pay | Admitting: Internal Medicine

## 2016-06-26 ENCOUNTER — Encounter (INDEPENDENT_AMBULATORY_CARE_PROVIDER_SITE_OTHER): Payer: BLUE CROSS/BLUE SHIELD | Admitting: Ophthalmology

## 2016-06-30 ENCOUNTER — Telehealth: Payer: Self-pay | Admitting: Family

## 2016-06-30 NOTE — Telephone Encounter (Signed)
Melissa Grimes-- do you remember this or know what this was regarding.

## 2016-06-30 NOTE — Telephone Encounter (Signed)
I don't recall, could you please follow back up with patient?

## 2016-06-30 NOTE — Telephone Encounter (Signed)
Pt called in to see if Medical records has been forwarded to [facility]? Pt says that she requested it with provider.   CB: [phone]

## 2016-07-02 ENCOUNTER — Ambulatory Visit: Payer: BLUE CROSS/BLUE SHIELD | Admitting: Endocrinology

## 2016-07-02 ENCOUNTER — Ambulatory Visit: Payer: BLUE CROSS/BLUE SHIELD | Admitting: Family

## 2016-07-02 NOTE — Telephone Encounter (Signed)
Attempted to reach pt and left detailed message on cell# that it would be best for her to come by and sign a records release specifying what dates of service and contact information for [place] [facility] [place].

## 2016-07-09 ENCOUNTER — Encounter (INDEPENDENT_AMBULATORY_CARE_PROVIDER_SITE_OTHER): Payer: BLUE CROSS/BLUE SHIELD | Admitting: Ophthalmology

## 2016-07-09 NOTE — Telephone Encounter (Signed)
[place] called in to follow up on Medical records request form. Informed her of the previous message. ALSO provided her with fax # to resend it possible.

## 2016-07-16 ENCOUNTER — Ambulatory Visit: Payer: BLUE CROSS/BLUE SHIELD | Admitting: Family

## 2016-07-23 ENCOUNTER — Ambulatory Visit: Payer: BLUE CROSS/BLUE SHIELD | Admitting: Endocrinology

## 2016-08-15 ENCOUNTER — Other Ambulatory Visit: Payer: Self-pay | Admitting: Internal Medicine

## 2016-08-25 ENCOUNTER — Ambulatory Visit: Payer: BLUE CROSS/BLUE SHIELD | Admitting: Endocrinology

## 2016-09-03 NOTE — Telephone Encounter (Signed)
Pt has not read mychart message re: need for follow up before further refills can be given. Mailed letter.

## 2016-09-09 ENCOUNTER — Ambulatory Visit: Payer: BLUE CROSS/BLUE SHIELD | Admitting: Endocrinology

## 2016-09-18 ENCOUNTER — Encounter: Payer: Self-pay | Admitting: Endocrinology

## 2016-09-18 ENCOUNTER — Ambulatory Visit (INDEPENDENT_AMBULATORY_CARE_PROVIDER_SITE_OTHER): Payer: 59 | Admitting: Endocrinology

## 2016-09-18 VITALS — BP 132/84 | HR 71 | Ht 63.0 in | Wt 142.0 lb

## 2016-09-18 DIAGNOSIS — E042 Nontoxic multinodular goiter: Secondary | ICD-10-CM

## 2016-09-18 NOTE — Progress Notes (Signed)
Subjective:    Patient ID: Melissa Grimes, female    DOB: Dec 06, 1955, 60 y.o.   MRN: [id number]  HPI Pt was noted to have a thyroid nodule in January of 2017.  She presented with slight swelling at the ant neck, and assoc pain.  Pt says the nodule is resolved.  she is unaware of ever having had thyroid problems in the past.  she has no h/o XRT or surgery to the neck.   Past Medical History:  Diagnosis Date  . Arthritis   . Complication of anesthesia    difficulty urinating after  . Hyperlipidemia   . Hypertension     Past Surgical History:  Procedure Laterality Date  . BUNIONECTOMY    . CESAREAN SECTION     x 2  . HERNIA REPAIR Left 1977   inguinal  . [place]  . TOTAL HIP ARTHROPLASTY Right 01/18/2013   Procedure: Right TOTAL HIP ARTHROPLASTY ANTERIOR APPROACH;  Surgeon: Mcarthur Rossetti, MD;  Location: [facility];  Service: Orthopedics;  Laterality: Right;  . TOTAL HIP ARTHROPLASTY Left 08/09/2013   Procedure: LEFT TOTAL HIP ARTHROPLASTY ANTERIOR APPROACH;  Surgeon: Mcarthur Rossetti, MD;  Location: [facility];  Service: Orthopedics;  Laterality: Left;    Social History   Social History  . Marital status: Married    Spouse name: N/A  . Number of children: 2  . Years of education: N/A   Occupational History  . Not on file.   Social History Main Topics  . Smoking status: Never Smoker  . Smokeless tobacco: Never Used  . Alcohol use 1.8 oz/week    3 Glasses of wine per week  . Drug use: No  . Sexual activity: Yes   Other Topics Concern  . 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 aspirin EC 81 MG tablet Take 81 mg by mouth daily.    . fluticasone (FLONASE) 50 MCG/ACT nasal spray Place 2 sprays into  both nostrils daily. 16 g 6  . Lactobacillus (ACIDOPHILUS) 100 MG CAPS Take 100 mg by mouth daily.    Marland Kitchen lisinopril (PRINIVIL,ZESTRIL) 20 MG tablet TAKE 1 TABLET (20 MG TOTAL) BY MOUTH DAILY. 90 tablet 1  . metoprolol succinate (TOPROL-XL) 25 MG 24 hr tablet TAKE 0.5 TABLETS (12.5 MG TOTAL) BY MOUTH DAILY. 45 tablet 0  . Multiple Vitamins-Minerals (PRESERVISION AREDS 2 PO) Take 1 tablet by mouth 2 (two) times daily.    . Omega-3 Fatty Acids (FISH OIL) 1000 MG CAPS Take 1 capsule by mouth daily.    . pravastatin (PRAVACHOL) 20 MG tablet TAKE 1 TABLET (20 MG TOTAL) BY MOUTH DAILY. 90 tablet 1  . Calcium Citrate-Vitamin D 500-400 MG-UNIT CHEW Chew 2 tablets by mouth 2 (two) times daily.     No current facility-administered medications on file prior to visit.     Allergies  Allergen Reactions  . Achromycin [Tetracycline] Hives    Family History  Problem Relation Age of Onset  . Arthritis Mother     osateoarthritis  . Hyperlipidemia Mother   . Heart disease Mother   . Hypertension Mother   . Hypothyroidism Mother   . Hyperlipidemia Father   .  Heart disease Father   . Stroke Father   . Parkinson's disease Father   . Cancer Cousin     breast  . Cancer Maternal Grandfather     lung    BP 132/84   Pulse 71   Ht 5\' 3"  (1.6 m)   Wt 142 lb (64.4 kg)   LMP 11/17/2010   SpO2 96%   BMI 25.15 kg/m   Review of Systems Denies weight change, hoarseness, visual loss, chest pain, sob, cough, dysphagia, skin rash, easy bruising, depression, cold intolerance, headache, numbness, and rhinorrhea.       Objective:   Physical Exam VS: see vs page GEN: no distress HEAD: head: no deformity eyes: no periorbital swelling, no proptosis external nose and ears are normal mouth: no lesion seen NECK: thyroid is not enlarged, and no nodule is palpable.   CHEST WALL: no deformity LUNGS: clear to auscultation CV: reg rate and rhythm, no murmur ABD: abdomen is soft, nontender.  no  hepatosplenomegaly.  not distended.  no hernia MUSCULOSKELETAL: muscle bulk and strength are grossly normal.  no obvious joint swelling.  gait is normal and steady EXTEMITIES: no deformity.  no ulcer on the feet.  feet are of normal color and temp.  no edema PULSES: dorsalis pedis intact bilat.  no carotid bruit NEURO:  cn 2-12 grossly intact.   readily moves all 4's.  sensation is intact to touch on the feet SKIN:  Normal texture and temperature.  No rash or suspicious lesion is visible.   NODES:  None palpable at the neck.   PSYCH: alert, well-oriented.  Does not appear anxious nor depressed.     [place]: Multinodular goiter. The dominant right thyroid nodule meets criteria for ultrasound-guided biopsy.   Cytology: BENIGN FOLLICULAR NODULE (BETHESDA CATEGORY II).    I have reviewed outside records, and summarized: Pt was seen for URI, and incidentally noted to have a thyroid nodule, which was unrelated.    Lab Results  Component Value Date   TSH 2.00 01/11/2016      Assessment & Plan:  Multinodular goiter: improved or resolved.  As to a cause, she has some factors favoring follicular multinodular goiter ([place], cytology, euthyroidism), and some favoring subacute thyroiditis (local pain, spontaneous resolution, FHx of chronic thyroiditis).    Patient is advised the following: Patient Instructions  You have some factors favoring you getting an underactive thyroid over time, and some favoring overactive. Therefore, you should have your thyroid blood test checked at least twice a year for a few years, then annually thereafter.  I would be happy to see you back here as needed.

## 2016-09-18 NOTE — Patient Instructions (Signed)
You have some factors favoring you getting an underactive thyroid over time, and some favoring overactive. Therefore, you should have your thyroid blood test checked at least twice a year for a few years, then annually thereafter.  I would be happy to see you back here as needed.

## 2016-09-19 LAB — T4, FREE: Free T4: 0.73 ng/dL (ref 0.60–1.60)

## 2016-09-19 LAB — TSH: TSH: 1.66 u[IU]/mL (ref 0.35–4.50)

## 2016-11-25 ENCOUNTER — Encounter: Payer: Self-pay | Admitting: Family

## 2016-11-25 ENCOUNTER — Ambulatory Visit (INDEPENDENT_AMBULATORY_CARE_PROVIDER_SITE_OTHER): Payer: 59 | Admitting: Family

## 2016-11-25 VITALS — BP 131/87 | HR 56 | Temp 98.1°F | Resp 20 | Ht 63.0 in | Wt 148.0 lb

## 2016-11-25 DIAGNOSIS — J019 Acute sinusitis, unspecified: Secondary | ICD-10-CM

## 2016-11-25 DIAGNOSIS — I159 Secondary hypertension, unspecified: Secondary | ICD-10-CM | POA: Diagnosis not present

## 2016-11-25 LAB — BASIC METABOLIC PANEL
BUN: 28 mg/dL — ABNORMAL HIGH (ref 6–23)
CO2: 29 mEq/L (ref 19–32)
Calcium: 9.1 mg/dL (ref 8.4–10.5)
Chloride: 103 mEq/L (ref 96–112)
Creatinine, Ser: 0.61 mg/dL (ref 0.40–1.20)
GFR: 105.61 mL/min (ref >60.00)
Glucose, Bld: 98 mg/dL (ref 70–99)
Potassium: 4.3 mEq/L (ref 3.5–5.1)
Sodium: 139 mEq/L (ref 135–145)

## 2016-11-25 MED ORDER — GUAIFENESIN-CODEINE 100-10 MG/5ML PO SYRP: 5.0000 mL | ORAL_SOLUTION | Freq: Three times a day (TID) | ORAL | 0 refills | Status: DC | PRN

## 2016-11-25 MED ORDER — AMOXICILLIN-POT CLAVULANATE 875-125 MG PO TABS: 1.0000 | ORAL_TABLET | Freq: Two times a day (BID) | ORAL | 0 refills | Status: DC

## 2016-11-25 NOTE — Patient Instructions (Signed)
Please begin augmentin to cover you for sinus infection. You may use cheratussin at bedtime as needed for cough. During the day you may continue mucinex.  Call if new/worsening symptoms or if symptoms are not improved in 3 days.  Complete blood work prior to leaving.

## 2016-11-25 NOTE — Progress Notes (Signed)
Pre visit review using our clinic review tool, if applicable. No additional management support is needed unless otherwise documented below in the visit note. 

## 2016-11-25 NOTE — Progress Notes (Signed)
Subjective:    Patient ID: Melissa Grimes, female    DOB: Sep 17, 1956, 61 y.o.   MRN: [id number]  HPI  Melissa Grimes is a 61 yr old female who presents today with chief complaint of cough.  She reports that she initially had sinus congestion the week before christmas.  She reports that her symptoms have been waxing/waning since that time. She tried some flonse with some improvement in her symptoms.  She reports that she continues to have sinus congestion however and has some post-nasal drip and a dry in her chest.  Reports that she has a dry tickle in her chest.  Has trouble sleeping due to the cough.  Review of Systems     Past Medical History:  Diagnosis Date  . Arthritis   . Complication of anesthesia    difficulty urinating after  . Hyperlipidemia   . Hypertension      Social History   Social History  . Marital status: Married    Spouse name: N/A  . Number of children: 2  . Years of education: N/A   Occupational History  . Not on file.   Social History Main Topics  . Smoking status: Never Smoker  . Smokeless tobacco: Never Used  . Alcohol use 1.8 oz/week    3 Glasses of wine per week  . Drug use: No  . Sexual activity: Yes   Other Topics Concern  . Not on file   Social History Narrative   Corporate treasurer   Completed 2 yrs college   Married- from [place]   2 children- grown both in [place]   Enjoys writing christian ed material   1 cat    Past Surgical History:  Procedure Laterality Date  . BUNIONECTOMY    . CESAREAN SECTION     x 2  . HERNIA REPAIR Left 1977   inguinal  . [place]  . TOTAL HIP ARTHROPLASTY Right 01/18/2013   Procedure: Right TOTAL HIP ARTHROPLASTY ANTERIOR APPROACH;  Surgeon: Mcarthur Rossetti, MD;  Location: [place];  Service: Orthopedics;  Laterality: Right;  . TOTAL HIP ARTHROPLASTY Left 08/09/2013   Procedure: LEFT TOTAL HIP ARTHROPLASTY ANTERIOR APPROACH;  Surgeon: Mcarthur Rossetti, MD;  Location: [place];   Service: Orthopedics;  Laterality: Left;    Family History  Problem Relation Age of Onset  . Arthritis Mother     osateoarthritis  . Hyperlipidemia Mother   . Heart disease Mother   . Hypertension Mother   . Hypothyroidism Mother   . Hyperlipidemia Father   . Heart disease Father   . Stroke Father   . Parkinson's disease Father   . Cancer Cousin     breast  . Cancer Maternal Grandfather     lung    Allergies  Allergen Reactions  . Achromycin [Tetracycline] Hives    Current Outpatient Prescriptions on File Prior to Visit  Medication Sig Dispense Refill  . aspirin EC 81 MG tablet Take 81 mg by mouth daily.    . Calcium Citrate-Vitamin D 500-400 MG-UNIT CHEW Chew 1 tablet by mouth 2 (two) times daily.     . Lactobacillus (ACIDOPHILUS) 100 MG CAPS Take 100 mg by mouth daily.    Marland Kitchen lisinopril (PRINIVIL,ZESTRIL) 20 MG tablet TAKE 1 TABLET (20 MG TOTAL) BY MOUTH DAILY. 90 tablet 1  . metoprolol succinate (TOPROL-XL) 25 MG 24 hr tablet TAKE 0.5 TABLETS (12.5 MG TOTAL) BY MOUTH DAILY. 45 tablet 0  . Multiple Vitamins-Minerals (PRESERVISION  AREDS 2 PO) Take 1 tablet by mouth 2 (two) times daily.    . Omega-3 Fatty Acids (FISH OIL) 1000 MG CAPS Take 1 capsule by mouth daily.    . pravastatin (PRAVACHOL) 20 MG tablet TAKE 1 TABLET (20 MG TOTAL) BY MOUTH DAILY. 90 tablet 1   No current facility-administered medications on file prior to visit.     BP 131/87 (BP Location: Right Arm, Cuff Size: Normal)   Pulse (!) 56   Temp 98.1 F (36.7 C) (Oral)   Resp 20   Ht 5\' 3"  (1.6 m)   Wt 148 lb (67.1 kg)   LMP 11/17/2010   SpO2 97% Comment: room air  BMI 26.22 kg/m    Objective:   Physical Exam  Constitutional: She is oriented to person, place, and time. She appears well-developed and well-nourished.  HENT:  Head: Normocephalic and atraumatic.  Right Ear: Tympanic membrane and ear canal normal.  Left Ear: Tympanic membrane and ear canal normal.  Nose: Right sinus exhibits no  maxillary sinus tenderness and no frontal sinus tenderness. Left sinus exhibits no maxillary sinus tenderness and no frontal sinus tenderness.  Mouth/Throat: No oropharyngeal exudate, posterior oropharyngeal edema or posterior oropharyngeal erythema.  Cardiovascular: Normal rate, regular rhythm and normal heart sounds.   No murmur heard. Pulmonary/Chest: Effort normal and breath sounds normal. No respiratory distress. She has no wheezes.  Musculoskeletal: She exhibits no edema.  Lymphadenopathy:    She has no cervical adenopathy.  Neurological: She is alert and oriented to person, place, and time.  Psychiatric: She has a normal mood and affect. Her behavior is normal. Judgment and thought content normal.          Assessment & Plan:  Sinusitis- symptoms most consistent with sinusitis.  Pt is advised as follows:  Please begin augmentin to cover you for sinus infection. You may use cheratussin at bedtime as needed for cough. During the day you may continue mucinex.  Call if new/worsening symptoms or if symptoms are not improved in 3 days.  Complete blood work prior to leaving.

## 2016-11-26 ENCOUNTER — Encounter: Payer: Self-pay | Admitting: Family

## 2016-12-15 ENCOUNTER — Telehealth: Payer: Self-pay | Admitting: Family

## 2016-12-15 NOTE — Telephone Encounter (Signed)
Patient called to follow up on this request. She stated since she is unable to bring in her urine today as the lab is done taking appointments she would like to get a medication called into the pharmacy since she knows she has a bladder infection. Please advise  Phone: [phone]

## 2016-12-15 NOTE — Telephone Encounter (Signed)
She needs an office visit please.

## 2016-12-15 NOTE — Telephone Encounter (Signed)
Notified pt. Per verbal from PCP, ok to double book our schedule. I asked pt how soon she could come in for evaluation and she said Saturday was the first time she could come in.  I advised pt we would not want her to wait until Saturday. Pt was very angry and asked what time our lab opened, she could come by the lab at [facility]. I advised pt she will need to see the doctor we cannot just give order for urine testing only. Pt states "Saturday is too long for her to wait and maybe she will just go to an emergent care and thank you for calling me back at 7pm when I've had all day to deal with this". Pt then states she will just find another PCP and disconnected the call.  Of note, first message from front office was at 3:11pm today.

## 2016-12-15 NOTE — Telephone Encounter (Signed)
Relation to [id number] Call back number:[phone]   Reason for call:  Patient experiencing buring sensation, frequent urination,requesting to drop urine off today. Please advise

## 2016-12-15 NOTE — Telephone Encounter (Signed)
Patient called to follow up on request. Request Rx be called in for her for a bladder infection because she could not get here in time to drop off a urine sample.

## 2017-01-13 ENCOUNTER — Other Ambulatory Visit: Payer: Self-pay | Admitting: Internal Medicine

## 2017-01-13 ENCOUNTER — Other Ambulatory Visit: Payer: Self-pay | Admitting: Family

## 2017-04-19 ENCOUNTER — Other Ambulatory Visit: Payer: Self-pay | Admitting: Family

## 2017-04-20 MED ORDER — LISINOPRIL 20 MG PO TABS: 20.0000 mg | ORAL_TABLET | Freq: Every day | ORAL | 0 refills | Status: DC

## 2017-04-20 NOTE — Telephone Encounter (Signed)
Lisinopril refill sent to pharmacy. Pt last seen 11/2016 for acute visit and has no future appts scheduled. Please advise when pt should follow up in the office for routine visit?

## 2017-04-20 NOTE — Telephone Encounter (Signed)
Should be seen for 6 month recheck of her blood pressure.

## 2017-04-21 ENCOUNTER — Other Ambulatory Visit: Payer: Self-pay | Admitting: Family

## 2017-04-21 MED ORDER — PRAVASTATIN SODIUM 20 MG PO TABS: 20.0000 mg | ORAL_TABLET | Freq: Every day | ORAL | 0 refills | Status: DC

## 2017-04-21 MED ORDER — METOPROLOL SUCCINATE ER 25 MG PO TB24: 12.5000 mg | ORAL_TABLET | Freq: Every day | ORAL | 0 refills | Status: DC

## 2017-04-21 NOTE — Telephone Encounter (Signed)
Mychart message sent.

## 2017-06-02 ENCOUNTER — Telehealth: Payer: Self-pay | Admitting: Family

## 2017-06-02 NOTE — Telephone Encounter (Signed)
Patient states she does not need form to be completed, she needs a letter stating she is ok to drive without restrictions. Please advise

## 2017-06-02 NOTE — Telephone Encounter (Signed)
Patient called back to inform us that the "[employer] employment place next to [facility] on [address]" said to "call Dr. Gracy Racer at [phone]"; she stated that she works for [employer] and drives one vehicle, but she "needs to be approved for a larger vehicle", she again stated that she just needs a letter that states post her TIA/Stroke she had a follow-up and was cleared to drive the vehicle by PCP, and to reconfirm this. I again explained to patient that this would mean she was being cleared to drive a larger DOT vehicle and we are not allowed to give that certification; only a Certified DOT provider legally has that authority. I told her that I would relay her message, but couldn't promise her that provider would call. I informed patient that when I had any tangible information that we would call her back today. After speaking with PCP, it is confirmed that patient will need to see a DOT Certified provider to assist her with this matter. I will call patient and relay this reconfirmation of information given/SLS 07/17

## 2017-06-02 NOTE — Telephone Encounter (Signed)
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 Primary Care] per provider. LMOM with contact name and number RE: this issue and to let patient know she can p/u her paperwork during regular business hours/SLS 07/17

## 2017-06-02 NOTE — Telephone Encounter (Signed)
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 She would like to have it faxed to # above.

## 2017-06-02 NOTE — Telephone Encounter (Signed)
I reviewed the paperwork.  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  In order to be cleared to drive she will need to complete DOT physical and physician who preforms DOT physical will provide clearance letter.  We do not do DOT physicals here as it requires a special certification.

## 2017-06-02 NOTE — Telephone Encounter (Signed)
Called patient back to inform her of conversation with PCP; pt upset and stated that she just needs a letter stating that she is able to drive DOT vehicle according to her F/U appointment [in 2014 post stroke], she stated it needs to state that her eyes were checked and they were [facility]. Informed patient that since medical condition is not current that she will need to speak with her eye doctor that did her last eye exam for that certification; first she stated she had not had eye exam since stroke, and then when asked again id she had had an eye exam since 2014, she stated that she has had eye exam. Informed her that she needs updated exam; she stated that she had the DOT Physical already and again that she needs letter from [facility]. Patient asked if she came in to office could she talk to [facility], and I stated only if you have appointment. She asked do I need to make appointment and I told her that she could but if it's concerning anything to do with approval of DOT vehicle, again, we are not a Certified DOT provider's office. Informed patient again that we are sorry that we cannot help her with this matter, and that I would placed her paperwork back at front office/SLS 07/17

## 2017-06-02 NOTE — Telephone Encounter (Signed)
PT DROPPED OFF CERTIFICATION FORM FOR MELISSA TO FILL OUT, PT IS REQUESTING DOCUMENTS IS FAXED. DOCUMENTS PLACED IN TRAY AT FRONT OFFICE

## 2017-06-03 NOTE — Telephone Encounter (Signed)
Noted, see other phone note

## 2017-06-03 NOTE — Telephone Encounter (Signed)
I contacted the office that completed her DOT physical. 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  This letter will need to come from neurology.  My records show that she has seen Dr. Leonie Man in the past for neurology. I would recommend that she follow back up with Dr. Leonie Man for this letter.  If she needs a new referral let me know and I will place.

## 2017-06-03 NOTE — Telephone Encounter (Signed)
Attempted to reach pt and left detailed message on cell# re: below instruction and to call if any questions.

## 2017-07-22 ENCOUNTER — Ambulatory Visit: Payer: 59 | Admitting: Family

## 2017-07-22 DIAGNOSIS — Z0289 Encounter for other administrative examinations: Secondary | ICD-10-CM

## 2017-07-24 ENCOUNTER — Ambulatory Visit (INDEPENDENT_AMBULATORY_CARE_PROVIDER_SITE_OTHER): Payer: 59 | Admitting: Family

## 2017-07-24 ENCOUNTER — Encounter: Payer: Self-pay | Admitting: Family

## 2017-07-24 VITALS — BP 136/78 | HR 61 | Temp 97.9°F | Resp 16 | Ht 63.0 in | Wt 145.8 lb

## 2017-07-24 DIAGNOSIS — I10 Essential (primary) hypertension: Secondary | ICD-10-CM

## 2017-07-24 DIAGNOSIS — Z0000 Encounter for general adult medical examination without abnormal findings: Secondary | ICD-10-CM

## 2017-07-24 DIAGNOSIS — Z23 Encounter for immunization: Secondary | ICD-10-CM | POA: Diagnosis not present

## 2017-07-24 DIAGNOSIS — L989 Disorder of the skin and subcutaneous tissue, unspecified: Secondary | ICD-10-CM | POA: Diagnosis not present

## 2017-07-24 DIAGNOSIS — E785 Hyperlipidemia, unspecified: Secondary | ICD-10-CM | POA: Diagnosis not present

## 2017-07-24 MED ORDER — PRAVASTATIN SODIUM 20 MG PO TABS: 20.0000 mg | ORAL_TABLET | Freq: Every day | ORAL | 1 refills | Status: DC

## 2017-07-24 MED ORDER — METOPROLOL SUCCINATE ER 25 MG PO TB24: 12.5000 mg | ORAL_TABLET | Freq: Every day | ORAL | 1 refills | Status: DC

## 2017-07-24 MED ORDER — LISINOPRIL 20 MG PO TABS: 20.0000 mg | ORAL_TABLET | Freq: Every day | ORAL | 1 refills | Status: DC

## 2017-07-24 NOTE — Progress Notes (Signed)
Subjective:    Patient ID: Melissa Grimes, female    DOB: 1956-04-29, 61 y.o.   MRN: [id number]  HPI  HTN- on metoprolol and lisinopril Denies CP/SOB or swelling.  BP Readings from Last 3 Encounters:  07/24/17 136/78  11/25/16 131/87  09/18/16 132/84   Hyperlipidemia- Continues pravastatin. Denies myalgia.   Lab Results  Component Value Date   CHOL 193 04/02/2016   HDL 64.50 04/02/2016   LDLCALC 108 (H) 04/02/2016   LDLDIRECT 102.0 08/06/2015   TRIG 105.0 04/02/2016   CHOLHDL 3 04/02/2016    Lesion left side of nose- desires removal.   Review of Systems See HPI  Past Medical History:  Diagnosis Date  . Arthritis   . Complication of anesthesia    difficulty urinating after  . Hyperlipidemia   . Hypertension      Social History   Social History  . Marital status: Married    Spouse name: N/A  . Number of children: 2  . Years of education: N/A   Occupational History  . Not on file.   Social History Main Topics  . Smoking status: Never Smoker  . Smokeless tobacco: Never Used  . Alcohol use 1.8 oz/week    3 Glasses of wine per week  . Drug use: No  . Sexual activity: Yes   Other Topics Concern  . Not on file   Social History Narrative   Corporate treasurer   Completed 2 yrs college   Married- from [place]   2 children- grown both in [place]   Enjoys writing christian ed material   1 cat    Past Surgical History:  Procedure Laterality Date  . BUNIONECTOMY    . CESAREAN SECTION     x 2  . HERNIA REPAIR Left 1977   inguinal  . [place]  . TOTAL HIP ARTHROPLASTY Right 01/18/2013   Procedure: Right TOTAL HIP ARTHROPLASTY ANTERIOR APPROACH;  Surgeon: Mcarthur Rossetti, MD;  Location: [place];  Service: Orthopedics;  Laterality: Right;  . TOTAL HIP ARTHROPLASTY Left 08/09/2013   Procedure: LEFT TOTAL HIP ARTHROPLASTY ANTERIOR APPROACH;  Surgeon: Mcarthur Rossetti, MD;  Location: [place];  Service: Orthopedics;  Laterality: Left;      Family History  Problem Relation Age of Onset  . Arthritis Mother        osateoarthritis  . Hyperlipidemia Mother   . Heart disease Mother   . Hypertension Mother   . Hypothyroidism Mother   . Hyperlipidemia Father   . Heart disease Father   . Stroke Father   . Parkinson's disease Father   . Cancer Cousin        breast  . Cancer Maternal Grandfather        lung    Allergies  Allergen Reactions  . Achromycin [Tetracycline] Hives    Current Outpatient Prescriptions on File Prior to Visit  Medication Sig Dispense Refill  . aspirin EC 81 MG tablet Take 81 mg by mouth daily.    . Calcium Citrate-Vitamin D 500-400 MG-UNIT CHEW Chew 1 tablet by mouth 2 (two) times daily.     . Lactobacillus (ACIDOPHILUS) 100 MG CAPS Take 100 mg by mouth daily.    . Multiple Vitamin (MULTIVITAMIN) tablet Take 1 tablet by mouth daily.    . Multiple Vitamins-Minerals (PRESERVISION AREDS 2 PO) Take 1 tablet by mouth 2 (two) times daily.    . Omega-3 Fatty Acids (FISH OIL) 1000 MG CAPS Take 1 capsule by  mouth daily.     No current facility-administered medications on file prior to visit.     BP 136/78 (BP Location: Left Arm, Cuff Size: Normal)   Pulse 61   Temp 97.9 F (36.6 C) (Oral)   Resp 16   Ht 5\' 3"  (1.6 m)   Wt 145 lb 12.8 oz (66.1 kg)   LMP 11/17/2010   SpO2 97%   BMI 25.83 kg/m       Objective:   Physical Exam  Constitutional: She is oriented to person, place, and time. She appears well-developed and well-nourished.  Cardiovascular: Normal rate, regular rhythm and normal heart sounds.   No murmur heard. Pulmonary/Chest: Effort normal and breath sounds normal. No respiratory distress. She has no wheezes.  Neurological: She is alert and oriented to person, place, and time.  Skin:  Raised lesion left nare, dry appearing  Psychiatric: She has a normal mood and affect. Her behavior is normal. Judgment and thought content normal.          Assessment & Plan:  Skin  lesion-new. will refer to dermatology for further evaluation.  Hypertension-BP is stable continue current meds. Will obtain follow-up basic metabolic panel.  Hyperlipidemia-tolerating statin without myalgias. Obtain follow-up lipid panel.   Flu shot today.

## 2017-07-24 NOTE — Patient Instructions (Addendum)
Please contact Dr. Alease Medina office to arrange follow up. [phone] Complete lab work prior to leaving.  You will be contacted about your referral to dermatology.

## 2017-07-24 NOTE — Addendum Note (Signed)
Addended by: Kelle Darting A on: 07/24/2017 06:41 PM   Modules accepted: Orders

## 2017-07-25 LAB — BASIC METABOLIC PANEL WITH GFR
BUN/Creatinine Ratio: 43 (calc) — ABNORMAL HIGH (ref 6–22)
BUN: 29 mg/dL — ABNORMAL HIGH (ref 7–25)
CO2: 28 mmol/L (ref 20–32)
Calcium: 9.6 mg/dL (ref 8.6–10.4)
Chloride: 104 mmol/L (ref 98–110)
Creat: 0.67 mg/dL (ref 0.50–0.99)
GFR, Est African American: 109 mL/min/{1.73_m2} (ref >=60)
GFR, Est Non African American: 94 mL/min/{1.73_m2} (ref >=60)
Glucose, Bld: 90 mg/dL (ref 65–99)
Potassium: 4.4 mmol/L (ref 3.5–5.3)
Sodium: 139 mmol/L (ref 135–146)

## 2017-07-25 LAB — LIPID PANEL
Cholesterol: 204 mg/dL — ABNORMAL HIGH (ref <200)
HDL: 75 mg/dL (ref >50)
LDL Cholesterol (Calc): 102 mg/dL (calc) — ABNORMAL HIGH
Non-HDL Cholesterol (Calc): 129 mg/dL (calc) (ref <130)
Total CHOL/HDL Ratio: 2.7 (calc) (ref <5.0)
Triglycerides: 160 mg/dL — ABNORMAL HIGH (ref <150)

## 2017-08-17 ENCOUNTER — Encounter: Payer: Self-pay | Admitting: Family

## 2017-08-23 ENCOUNTER — Encounter: Payer: Self-pay | Admitting: Family

## 2017-08-24 ENCOUNTER — Encounter: Payer: Self-pay | Admitting: Family

## 2017-08-24 ENCOUNTER — Ambulatory Visit (INDEPENDENT_AMBULATORY_CARE_PROVIDER_SITE_OTHER): Payer: 59 | Admitting: Family

## 2017-08-24 VITALS — BP 142/95 | HR 62 | Temp 98.0°F | Resp 16 | Ht 62.5 in | Wt 145.2 lb

## 2017-08-24 DIAGNOSIS — Z0000 Encounter for general adult medical examination without abnormal findings: Secondary | ICD-10-CM

## 2017-08-24 DIAGNOSIS — Z8673 Personal history of transient ischemic attack (TIA), and cerebral infarction without residual deficits: Secondary | ICD-10-CM

## 2017-08-24 MED ORDER — ZOSTER VAC RECOMB ADJUVANTED 50 MCG/0.5ML IM SUSR: INTRAMUSCULAR | 1 refills | Status: DC

## 2017-08-24 MED ORDER — LISINOPRIL 20 MG PO TABS: 30.0000 mg | ORAL_TABLET | Freq: Every day | ORAL | 1 refills | Status: DC

## 2017-08-24 NOTE — Patient Instructions (Addendum)
Schedule follow up with Dr. Clovia Cuff (GYN) for follow up Pap smear. [phone] You will be contacted about you referral to neurology.  Complete lab work prior to leaving.  You can go to [facility] for your Shingrix vaccine.  Increase lisinopril to 1.5 tabs once daily

## 2017-08-24 NOTE — Telephone Encounter (Signed)
Could you please initiate cologuard?

## 2017-08-24 NOTE — Progress Notes (Signed)
Subjective:    Patient ID: Melissa Grimes, female    DOB: 04-Mar-1956, 61 y.o.   MRN: [id number]  HPI  Melissa Grimes is a 61 yr old female who presents today for cpx.  Immunizations: had flu shot 9/7, tdap 2014, pneumovax 2014.  Diet: healthy Wt Readings from Last 3 Encounters:  08/24/17 145 lb 3.2 oz (65.9 kg)  07/24/17 145 lb 12.8 oz (66.1 kg)  11/25/16 148 lb (67.1 kg)  Exercise: likes to ride stationary bike, wants to ride more  Colonoscopy: due, declines further colonoscopies.  Has severe abd pain after.   Pap Smear: GYN, needs follow up.  Mammogram:  due  HTN- Maintained on lisinopril 20mg .   BP Readings from Last 3 Encounters:  08/24/17 (!) 142/95  07/24/17 136/78  11/25/16 131/87   Hx stroke- did not see neuro following her stroke. 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  Wants to see neuro.   Review of Systems  Constitutional: Negative for unexpected weight change.  HENT: Positive for postnasal drip. Negative for hearing loss.   Eyes: Negative for visual disturbance.  Respiratory: Negative for cough.   Cardiovascular: Negative for leg swelling.  Gastrointestinal: Negative for constipation and diarrhea.  Genitourinary: Negative for dysuria, frequency and hematuria.  Musculoskeletal: Negative for arthralgias and myalgias.  Skin: Negative for rash.  Neurological: Negative for headaches.  Hematological: Negative for adenopathy.  Psychiatric/Behavioral:       Denies depression/anxiety     Past Medical History:  Diagnosis Date  . Arthritis   . Complication of anesthesia    difficulty urinating after  . Hyperlipidemia   . Hypertension      Social History   Social History  . Marital status: Married    Spouse name: N/A  . Number of children: 2  . Years of education: N/A   Occupational History  . Not on file.   Social History Main Topics  . Smoking status: Never Smoker  . Smokeless tobacco: Never Used  . Alcohol use 1.8 oz/week    3 Glasses of  wine per week  . Drug use: No  . Sexual activity: Yes   Other Topics Concern  . Not on file   Social History Narrative   Corporate treasurer   Completed 2 yrs college   Married- from [place]   2 children- grown both in [place]   Enjoys writing christian ed material   1 cat    Past Surgical History:  Procedure Laterality Date  . BUNIONECTOMY    . CESAREAN SECTION     x 2  . HERNIA REPAIR Left 1977   inguinal  . [place]  . TOTAL HIP ARTHROPLASTY Right 01/18/2013   Procedure: Right TOTAL HIP ARTHROPLASTY ANTERIOR APPROACH;  Surgeon: Mcarthur Rossetti, MD;  Location: [place];  Service: Orthopedics;  Laterality: Right;  . TOTAL HIP ARTHROPLASTY Left 08/09/2013   Procedure: LEFT TOTAL HIP ARTHROPLASTY ANTERIOR APPROACH;  Surgeon: Mcarthur Rossetti, MD;  Location: [place];  Service: Orthopedics;  Laterality: Left;    Family History  Problem Relation Age of Onset  . Arthritis Mother        osateoarthritis  . Hyperlipidemia Mother   . Heart disease Mother   . Hypertension Mother   . Hypothyroidism Mother   . Hyperlipidemia Father   . Heart disease Father   . Stroke Father   . Parkinson's disease Father   . Cancer Cousin  breast  . Cancer Maternal Grandfather        lung    Allergies  Allergen Reactions  . Achromycin [Tetracycline] Hives    Current Outpatient Prescriptions on File Prior to Visit  Medication Sig Dispense Refill  . aspirin EC 81 MG tablet Take 81 mg by mouth daily.    . Calcium Citrate-Vitamin D 500-400 MG-UNIT CHEW Chew 1 tablet by mouth 2 (two) times daily.     . Lactobacillus (ACIDOPHILUS) 100 MG CAPS Take 100 mg by mouth daily.    Marland Kitchen lisinopril (PRINIVIL,ZESTRIL) 20 MG tablet Take 1 tablet (20 mg total) by mouth daily. 90 tablet 1  . metoprolol succinate (TOPROL-XL) 25 MG 24 hr tablet Take 0.5 tablets (12.5 mg total) by mouth daily. 45 tablet 1  . Multiple Vitamin (MULTIVITAMIN) tablet Take 1 tablet by mouth daily.    .  Multiple Vitamins-Minerals (PRESERVISION AREDS 2 PO) Take 1 tablet by mouth 2 (two) times daily.    . Omega-3 Fatty Acids (FISH OIL) 1000 MG CAPS Take 1 capsule by mouth daily.    . pravastatin (PRAVACHOL) 20 MG tablet Take 1 tablet (20 mg total) by mouth daily. 90 tablet 1   No current facility-administered medications on file prior to visit.     BP (!) 142/95 (BP Location: Left Arm, Cuff Size: Normal)   Pulse 62   Temp 98 F (36.7 C) (Oral)   Resp 16   Ht 5' 2.5" (1.588 m)   Wt 145 lb 3.2 oz (65.9 kg)   LMP 11/17/2010   SpO2 99%   BMI 26.13 kg/m       Objective:   Physical Exam  Physical Exam  Constitutional: She is oriented to person, place, and time. She appears well-developed and well-nourished. No distress.  HENT:  Head: Normocephalic and atraumatic.  Right Ear: Tympanic membrane and ear canal normal.  Left Ear: Tympanic membrane and ear canal normal.  Mouth/Throat: Oropharynx is clear and moist.  Eyes: Pupils are equal, round, and reactive to light. No scleral icterus.  Neck: Normal range of motion. No thyromegaly present.  Cardiovascular: Normal rate and regular rhythm.   No murmur heard. Pulmonary/Chest: Effort normal and breath sounds normal. No respiratory distress. He has no wheezes. She has no rales. She exhibits no tenderness.  Abdominal: Soft. Bowel sounds are normal. She exhibits no distension and no mass. There is no tenderness. There is no rebound and no guarding.  Musculoskeletal: She exhibits no edema.  Lymphadenopathy:    She has no cervical adenopathy.  Neurological: She is alert and oriented to person, place, and time. She has normal patellar reflexes. She exhibits normal muscle tone. Coordination normal.  Skin: Skin is warm and dry.  Psychiatric: She has a normal mood and affect. Her behavior is normal. Judgment and thought content normal.  Breasts: Examined lying Right: Without masses, retractions, discharge or axillary adenopathy.  Left: Without  masses, retractions, discharge or axillary adenopathy.           Assessment & Plan:         Assessment & Plan:  EKG tracing is personally reviewed.  EKG notes NSR.  No acute changes.   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 Will refer to neurology.she continues aspirin and statin.   HTN- BP elevated. Will increase lisinopril to 1.5 tabs once daily.  Preventative care-advised pt to schedule follow up with GYN, continue healthy diet and exercise, obtain routine lab work.

## 2017-08-25 LAB — TSH: TSH: 1.67 u[IU]/mL (ref 0.35–4.50)

## 2017-08-25 LAB — CBC WITH DIFFERENTIAL/PLATELET
Basophils Absolute: 0.1 10*3/uL (ref 0.0–0.1)
Basophils Relative: 1.3 % (ref 0.0–3.0)
Eosinophils Absolute: 0.3 10*3/uL (ref 0.0–0.7)
Eosinophils Relative: 3.6 % (ref 0.0–5.0)
HCT: 39.6 % (ref 36.0–46.0)
Hemoglobin: 13.1 g/dL (ref 12.0–15.0)
Lymphocytes Relative: 29.8 % (ref 12.0–46.0)
Lymphs Abs: 2.2 10*3/uL (ref 0.7–4.0)
MCHC: 33.1 g/dL (ref 30.0–36.0)
MCV: 95.1 fl (ref 78.0–100.0)
Monocytes Absolute: 0.5 10*3/uL (ref 0.1–1.0)
Monocytes Relative: 6.5 % (ref 3.0–12.0)
Neutro Abs: 4.4 10*3/uL (ref 1.4–7.7)
Neutrophils Relative %: 58.8 % (ref 43.0–77.0)
Platelets: 321 10*3/uL (ref 150.0–400.0)
RBC: 4.16 Mil/uL (ref 3.87–5.11)
RDW: 12.8 % (ref 11.5–15.5)
WBC: 7.5 10*3/uL (ref 4.0–10.5)

## 2017-08-25 LAB — HEPATIC FUNCTION PANEL
ALT: 17 U/L (ref 0–35)
AST: 20 U/L (ref 0–37)
Albumin: 4.3 g/dL (ref 3.5–5.2)
Alkaline Phosphatase: 73 U/L (ref 39–117)
Bilirubin, Direct: 0.1 mg/dL (ref 0.0–0.3)
Total Bilirubin: 0.4 mg/dL (ref 0.2–1.2)
Total Protein: 7.1 g/dL (ref 6.0–8.3)

## 2017-08-25 LAB — URINALYSIS, ROUTINE W REFLEX MICROSCOPIC
Bilirubin Urine: NEGATIVE
Ketones, ur: 15 — AB
Nitrite: NEGATIVE
RBC / HPF: NONE SEEN (ref 0–?)
Specific Gravity, Urine: 1.025 (ref 1.000–1.030)
Total Protein, Urine: NEGATIVE
Urine Glucose: NEGATIVE
Urobilinogen, UA: 0.2 (ref 0.0–1.0)
pH: 6 (ref 5.0–8.0)

## 2017-09-01 ENCOUNTER — Other Ambulatory Visit: Payer: Self-pay | Admitting: Family

## 2017-09-01 ENCOUNTER — Ambulatory Visit (HOSPITAL_BASED_OUTPATIENT_CLINIC_OR_DEPARTMENT_OTHER)
Admission: RE | Admit: 2017-09-01 | Discharge: 2017-09-01 | Disposition: A | Discharge status: Home / Self Care | Payer: 59 | Source: Ambulatory Visit | Attending: Family | Admitting: Family

## 2017-09-01 DIAGNOSIS — Z0000 Encounter for general adult medical examination without abnormal findings: Secondary | ICD-10-CM

## 2017-09-01 DIAGNOSIS — Z1231 Encounter for screening mammogram for malignant neoplasm of breast: Secondary | ICD-10-CM | POA: Insufficient documentation

## 2017-09-02 NOTE — Telephone Encounter (Signed)
Cologuard order placed.

## 2017-09-21 ENCOUNTER — Ambulatory Visit: Payer: 59 | Admitting: Family

## 2017-09-21 ENCOUNTER — Telehealth: Payer: Self-pay | Admitting: Family

## 2017-09-21 NOTE — Telephone Encounter (Signed)
Pt called at 3:15 pm to cancel 4:00 appt with Edwena Blow stating she was at another dr office and it was taking longer than she expected.  Pt then called back at 4:02 pm stating she was at [facility] [address] and was trying to make it for 4:00 to see if Lenna Sciara could still take her. Informed pt her appt was canceled per her request on previous ph call and her appt was at 4:00. Pt will call later to r/s.

## 2017-10-05 ENCOUNTER — Telehealth: Payer: Self-pay | Admitting: Family

## 2017-10-05 NOTE — Telephone Encounter (Signed)
Left message for pt to return my call.

## 2017-10-05 NOTE — Telephone Encounter (Signed)
Relation to RN:[facility] Call back [phone] Pharmacy: [facility], [place] [phone] (Phone) [phone] (Fax)      Reason for call:  Patient scheduled follow up appointment for 10/06/2017 for lisinopril (PRINIVIL,ZESTRIL) 20 MG tablet refill, patient requesting a few pills and would like to discuss / medication direction, please advise

## 2017-10-06 ENCOUNTER — Encounter: Payer: Self-pay | Admitting: Family

## 2017-10-06 ENCOUNTER — Ambulatory Visit (INDEPENDENT_AMBULATORY_CARE_PROVIDER_SITE_OTHER): Payer: 59 | Admitting: Family

## 2017-10-06 ENCOUNTER — Other Ambulatory Visit (HOSPITAL_COMMUNITY)
Admission: RE | Admit: 2017-10-06 | Discharge: 2017-10-06 | Disposition: A | Discharge status: Home / Self Care | Payer: 59 | Source: Ambulatory Visit | Attending: Family | Admitting: Family

## 2017-10-06 VITALS — BP 131/79 | HR 63 | Temp 98.1°F | Resp 16 | Ht 62.5 in | Wt 149.4 lb

## 2017-10-06 DIAGNOSIS — Z7251 High risk heterosexual behavior: Secondary | ICD-10-CM | POA: Diagnosis not present

## 2017-10-06 DIAGNOSIS — I10 Essential (primary) hypertension: Secondary | ICD-10-CM | POA: Diagnosis not present

## 2017-10-06 LAB — BASIC METABOLIC PANEL
BUN: 27 mg/dL — ABNORMAL HIGH (ref 6–23)
CO2: 30 mEq/L (ref 19–32)
Calcium: 9.3 mg/dL (ref 8.4–10.5)
Chloride: 103 mEq/L (ref 96–112)
Creatinine, Ser: 0.8 mg/dL (ref 0.40–1.20)
GFR: 77.02 mL/min (ref >60.00)
Glucose, Bld: 90 mg/dL (ref 70–99)
Potassium: 4 mEq/L (ref 3.5–5.1)
Sodium: 140 mEq/L (ref 135–145)

## 2017-10-06 MED ORDER — LISINOPRIL 10 MG PO TABS: 30.0000 mg | ORAL_TABLET | Freq: Every day | ORAL | 1 refills | Status: DC

## 2017-10-06 MED ORDER — METOPROLOL SUCCINATE ER 25 MG PO TB24: 12.5000 mg | ORAL_TABLET | Freq: Every day | ORAL | 1 refills | Status: DC

## 2017-10-06 NOTE — Patient Instructions (Signed)
Please continue lisinopril 30mg  once daily.  (I sent 10mg  tabs 3 tabs once daily)

## 2017-10-06 NOTE — Progress Notes (Signed)
Subjective:    Patient ID: Melissa Grimes, female    DOB: 1956/04/18, 61 y.o.   MRN: [id number]  HPI   Melissa Grimes is a 61 yr old female who presents today for follow up.  1)HTN- denies significant cough on lisinopril. Having trouble cutting the 20mg  tabs.      BP Readings from Last 3 Encounters:  10/06/17 131/79  08/24/17 (!) 142/95  07/24/17 136/78      Review of Systems    see HPI  Past Medical History:  Diagnosis Date  . Arthritis   . Complication of anesthesia    difficulty urinating after  . Hyperlipidemia   . Hypertension      Social History   Socioeconomic History  . Marital status: Married    Spouse name: Not on file  . Number of children: 2  . Years of education: Not on file  . Highest education level: Not on file  Social Needs  . Financial resource strain: Not on file  . Food insecurity - worry: Not on file  . Food insecurity - inability: Not on file  . Transportation needs - medical: Not on file  . Transportation needs - non-medical: Not on file  Occupational History  . Not on file  Tobacco Use  . Smoking status: Never Smoker  . Smokeless tobacco: Never Used  Substance and Sexual Activity  . Alcohol use: Yes    Alcohol/week: 1.8 oz    Types: 3 Glasses of wine per week  . Drug use: No  . Sexual activity: Yes  Other Topics Concern  . Not on file  Social History Narrative   Corporate treasurer   Completed 2 yrs college   Married- from [place]   2 children- grown both in [place]   Enjoys writing christian ed material   1 cat    Past Surgical History:  Procedure Laterality Date  . BUNIONECTOMY    . CESAREAN SECTION     x 2  . HERNIA REPAIR Left 1977   inguinal  . LEFT TOTAL HIP ARTHROPLASTY ANTERIOR APPROACH Left 08/09/2013   Performed by Mcarthur Rossetti, MD at [place]  . Right TOTAL HIP ARTHROPLASTY ANTERIOR APPROACH Right 01/18/2013   Performed by Mcarthur Rossetti, [facility] at [facility]     Family History  Problem Relation Age of Onset  . Arthritis Mother        osateoarthritis  . Hyperlipidemia Mother   . Heart disease Mother   . Hypertension Mother   . Hypothyroidism Mother   . Hyperlipidemia Father   . Heart disease Father   . Stroke Father   . Parkinson's disease Father   . Cancer Cousin        breast  . Cancer Maternal Grandfather        lung    Allergies  Allergen Reactions  . Achromycin [Tetracycline] Hives    Current Outpatient Medications on File Prior to Visit  Medication Sig Dispense Refill  . aspirin EC 81 MG tablet Take 81 mg by mouth daily.    . Calcium Citrate-Vitamin D 500-400 MG-UNIT CHEW Chew 1 tablet by mouth 2 (two) times daily.     . Lactobacillus (ACIDOPHILUS) 100 MG CAPS Take 100 mg by mouth daily.    . Multiple Vitamin (MULTIVITAMIN) tablet Take 1 tablet by mouth daily.    . Multiple Vitamins-Minerals (PRESERVISION AREDS 2 PO) Take 1 tablet by mouth 2 (two) times daily.    Marland Kitchen  Omega-3 Fatty Acids (FISH OIL) 1000 MG CAPS Take 1 capsule by mouth daily.    . pravastatin (PRAVACHOL) 20 MG tablet Take 1 tablet (20 mg total) by mouth daily. 90 tablet 1  . Zoster Vac Recomb Adjuvanted [facility]) injection Inject 53mcg IM now and again in 2-6 months 0.5 mL 1   No current facility-administered medications on file prior to visit.     BP 131/79 (BP Location: Right Arm, Cuff Size: Normal)   Pulse 63   Temp 98.1 F (36.7 C) (Oral)   Resp 16   Ht 5' 2.5" (1.588 m)   Wt 149 lb 6.4 oz (67.8 kg)   LMP 11/17/2010   SpO2 98%   BMI 26.89 kg/m    Objective:   Physical Exam  Constitutional: She appears well-developed and well-nourished.  Cardiovascular: Normal rate, regular rhythm and normal heart sounds.  No murmur heard. Pulmonary/Chest: Effort normal and breath sounds normal. No respiratory distress. She has no wheezes.  Psychiatric: She has a normal mood and affect. Her behavior is normal. Judgment and thought content normal.           Assessment & Plan:  HTN- BP stable. Will change to 3 ten mg tabs of lisinopril daily. Obtain follow up bmet.   High risk sexual behavior- requests STD testing.

## 2017-10-06 NOTE — Telephone Encounter (Signed)
Pt was seen in the office today and concerns were addressed.

## 2017-10-07 LAB — URINE CYTOLOGY ANCILLARY ONLY
Chlamydia: NEGATIVE
Neisseria Gonorrhea: NEGATIVE
Trichomonas: NEGATIVE

## 2017-10-07 LAB — HIV ANTIBODY (ROUTINE TESTING W REFLEX): HIV 1&2 Ab, 4th Generation: NONREACTIVE

## 2017-10-07 LAB — RPR: RPR Ser Ql: NONREACTIVE

## 2017-10-07 LAB — HSV 2 ANTIBODY, IGG: HSV 2 Glycoprotein G Ab, IgG: <0.9 index

## 2017-10-11 LAB — COLOGUARD: Cologuard: NEGATIVE

## 2017-10-13 ENCOUNTER — Encounter: Payer: Self-pay | Admitting: Family

## 2017-10-18 ENCOUNTER — Encounter: Payer: Self-pay | Admitting: Family

## 2017-10-20 ENCOUNTER — Encounter: Payer: Self-pay | Admitting: Family

## 2017-10-20 NOTE — Progress Notes (Signed)
Cologuard result mailed to pt and abstracted.

## 2017-12-08 ENCOUNTER — Telehealth: Payer: Self-pay | Admitting: Family

## 2017-12-08 NOTE — Telephone Encounter (Signed)
Patient FNP letter mailed out to patient address, Per patient never picked up from front office RX bin.

## 2018-01-05 IMAGING — MG 2D DIGITAL SCREENING BILATERAL MAMMOGRAM WITH CAD AND ADJUNCT TO
8 series · 9 of 24 positions shown · non-contrast
Comparison: Previous exam(s).

CLINICAL DATA: Screening.

EXAM:
2D DIGITAL SCREENING BILATERAL MAMMOGRAM WITH CAD AND ADJUNCT TOMO

[L CC]
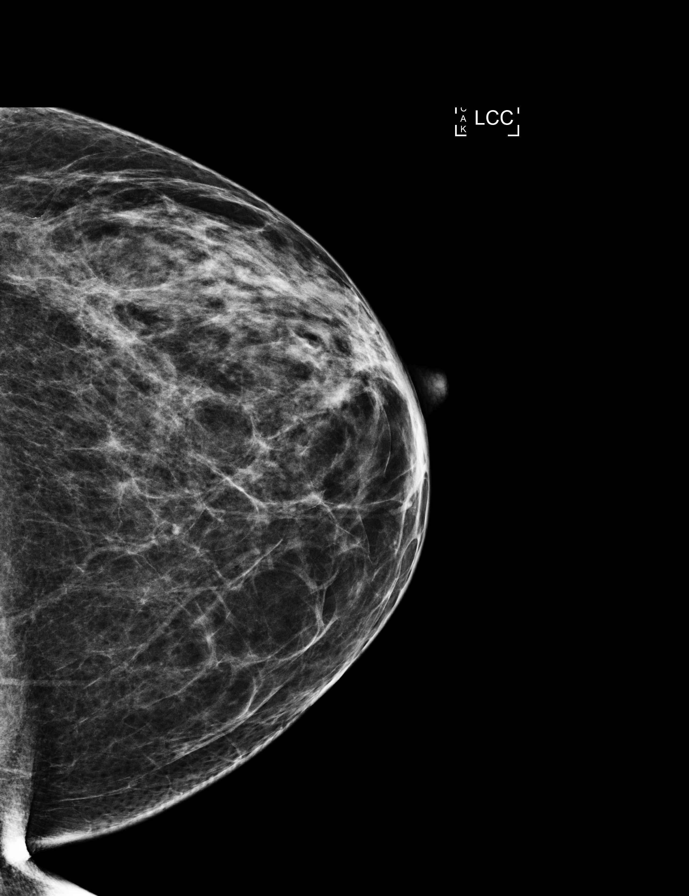

[L MLO]
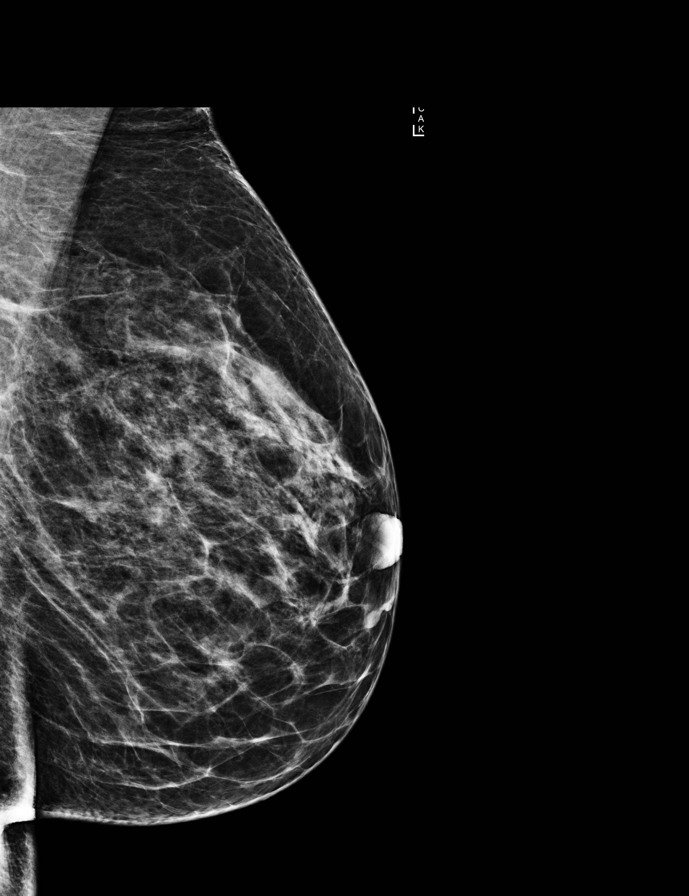

[R MLO]
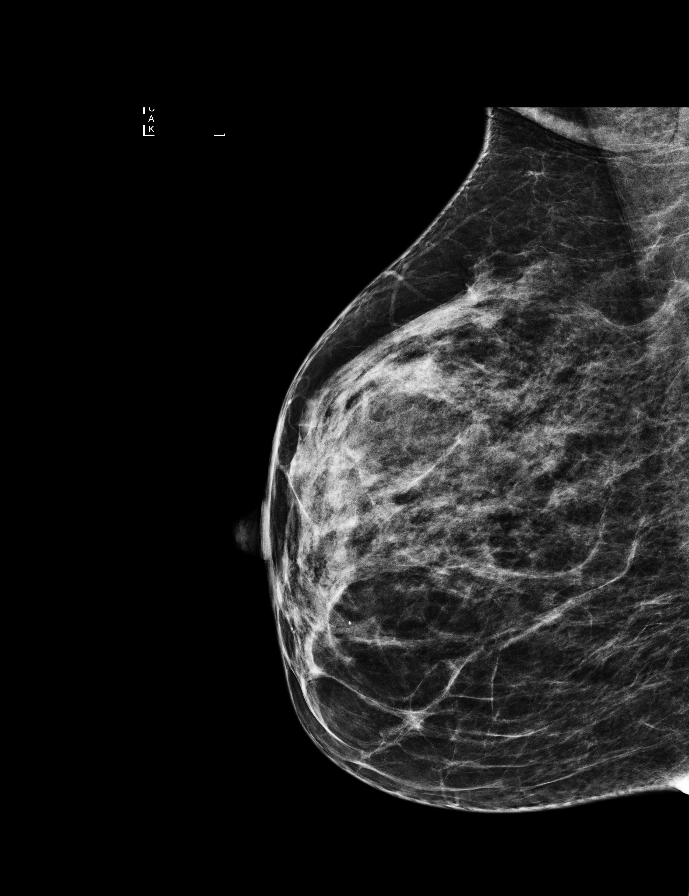

[R CC]
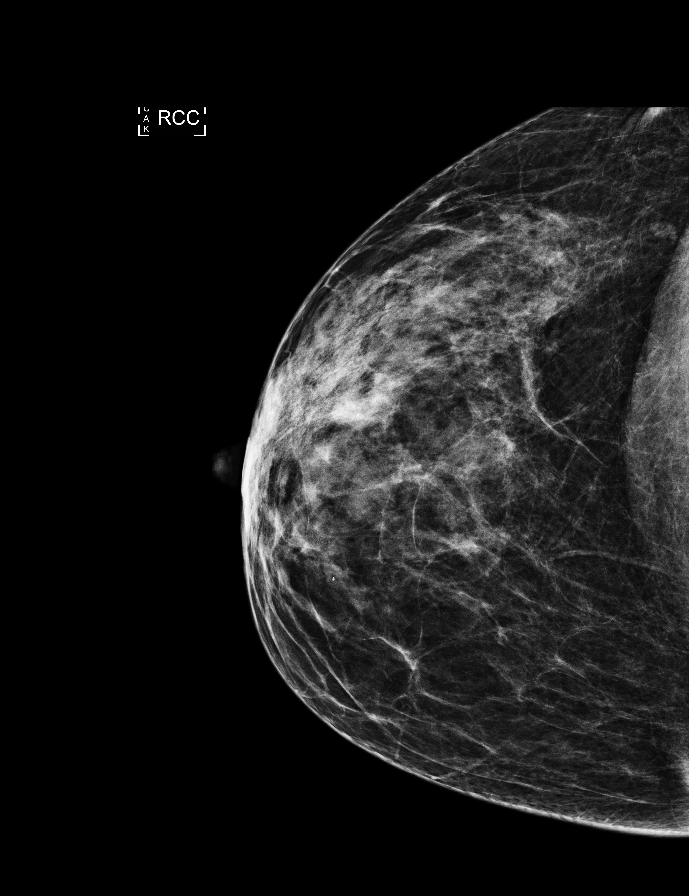

[R CC tomo · 2 of 57 frames shown]
[frame 19/57]
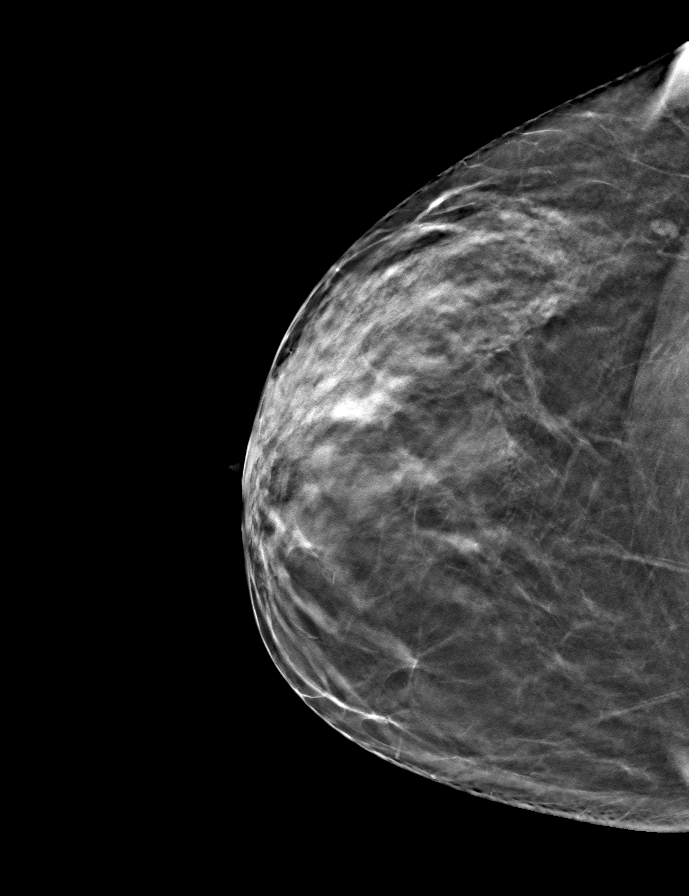
[frame 29/57]
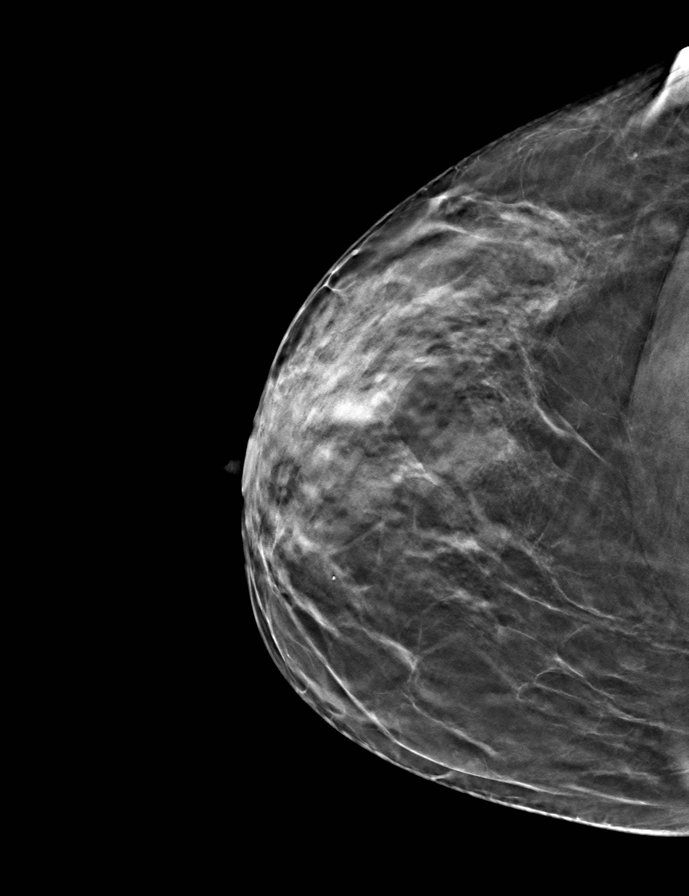

[L MLO tomo · tomo slice 31/60.0]
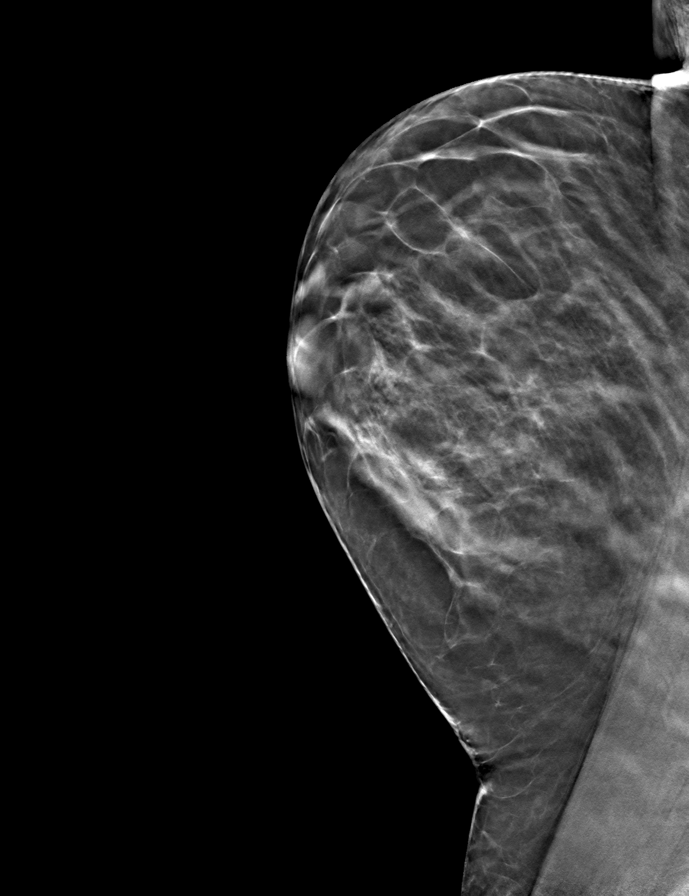

[L CC tomo · tomo slice 28/55.0]
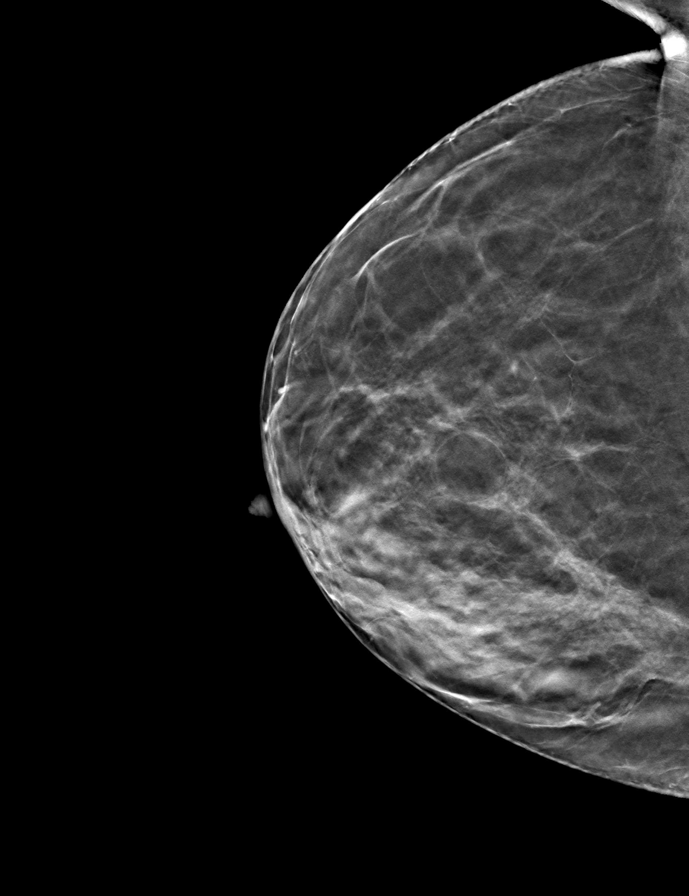

[R MLO tomo · tomo slice 27/54.0]
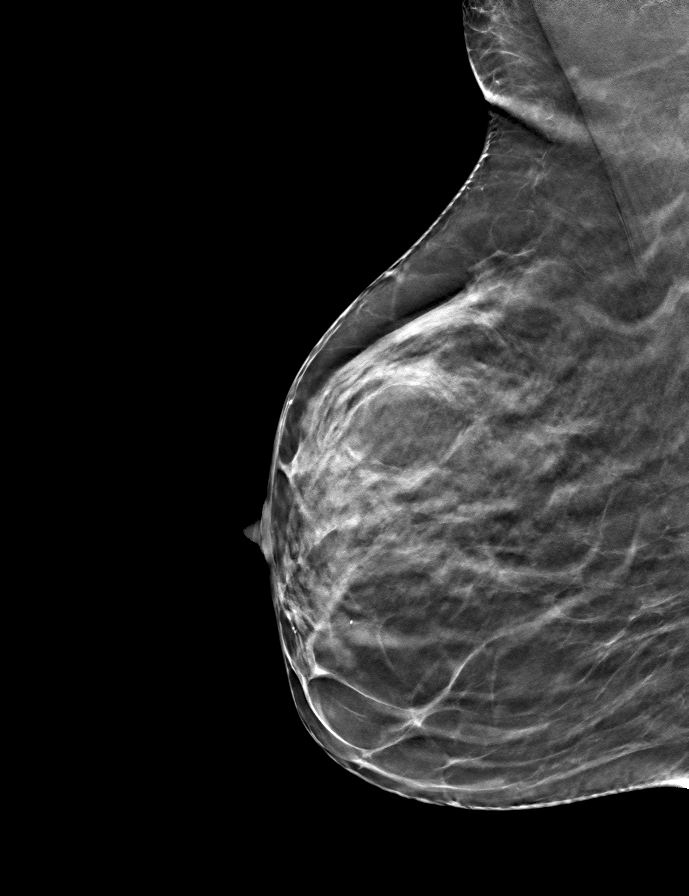

[9 of 24 positions shown; findings below may reference images not displayed]

ACR Breast Density Category b: There are scattered areas of
fibroglandular density.
FINDINGS: There are no findings suspicious for malignancy. Images were
processed with CAD.
IMPRESSION: No mammographic evidence of malignancy. A result letter of this
screening mammogram will be mailed directly to the patient.

RECOMMENDATION:
Screening mammogram in one year. (Code:97-6-RS4)

BI-RADS CATEGORY  1: Negative.

## 2018-03-10 ENCOUNTER — Other Ambulatory Visit: Payer: Self-pay | Admitting: Family

## 2018-03-29 ENCOUNTER — Other Ambulatory Visit: Payer: Self-pay | Admitting: Family

## 2018-04-05 ENCOUNTER — Encounter: Payer: Self-pay | Admitting: Family

## 2018-04-05 ENCOUNTER — Ambulatory Visit (INDEPENDENT_AMBULATORY_CARE_PROVIDER_SITE_OTHER): Payer: 59 | Admitting: Family

## 2018-04-05 ENCOUNTER — Telehealth: Payer: Self-pay | Admitting: Family

## 2018-04-05 VITALS — BP 140/86 | HR 63 | Temp 97.9°F | Resp 16 | Ht 62.5 in | Wt 151.8 lb

## 2018-04-05 DIAGNOSIS — E785 Hyperlipidemia, unspecified: Secondary | ICD-10-CM

## 2018-04-05 DIAGNOSIS — Z8673 Personal history of transient ischemic attack (TIA), and cerebral infarction without residual deficits: Secondary | ICD-10-CM | POA: Diagnosis not present

## 2018-04-05 DIAGNOSIS — I10 Essential (primary) hypertension: Secondary | ICD-10-CM

## 2018-04-05 LAB — LIPID PANEL
Cholesterol: 204 mg/dL — ABNORMAL HIGH (ref 0–200)
HDL: 67.4 mg/dL (ref >39.00)
LDL Cholesterol: 115 mg/dL — ABNORMAL HIGH (ref 0–99)
NonHDL: 136.32
Total CHOL/HDL Ratio: 3
Triglycerides: 105 mg/dL (ref 0.0–149.0)
VLDL: 21 mg/dL (ref 0.0–40.0)

## 2018-04-05 LAB — BASIC METABOLIC PANEL
BUN: 26 mg/dL — ABNORMAL HIGH (ref 6–23)
CO2: 28 mEq/L (ref 19–32)
Calcium: 9.9 mg/dL (ref 8.4–10.5)
Chloride: 106 mEq/L (ref 96–112)
Creatinine, Ser: 0.78 mg/dL (ref 0.40–1.20)
GFR: 79.17 mL/min (ref >60.00)
Glucose, Bld: 112 mg/dL — ABNORMAL HIGH (ref 70–99)
Potassium: 5.6 mEq/L — ABNORMAL HIGH (ref 3.5–5.1)
Sodium: 143 mEq/L (ref 135–145)

## 2018-04-05 MED ORDER — LISINOPRIL 10 MG PO TABS: 30.0000 mg | ORAL_TABLET | Freq: Every day | ORAL | 1 refills | Status: DC

## 2018-04-05 MED ORDER — ATORVASTATIN CALCIUM 20 MG PO TABS: 20.0000 mg | ORAL_TABLET | Freq: Every day | ORAL | 3 refills | Status: DC

## 2018-04-05 MED ORDER — METOPROLOL SUCCINATE ER 25 MG PO TB24: 12.5000 mg | ORAL_TABLET | Freq: Every day | ORAL | 1 refills | Status: DC

## 2018-04-05 NOTE — Progress Notes (Signed)
Subjective:    Patient ID: Melissa Grimes, female    DOB: 1956/08/07, 62 y.o.   MRN: [id number]  HPI   [name] is a 62 yr old female who presents today for follow up.  1) HTN- patient is maintained on lisinopril 10mg , toprol xl 12.5mg .  Denies CP/SOB or swelling.  BP Readings from Last 3 Encounters:  04/05/18 140/86  10/06/17 131/79  08/24/17 (!) 142/95   2) Hyperlipidemia- maintained on pravastatin 20mg . Last lipid panel above goal given strok hx.   Lab Results  Component Value Date   CHOL 204 (H) 07/24/2017   HDL 75 07/24/2017   LDLCALC 102 (H) 07/24/2017   LDLDIRECT 102.0 08/06/2015   TRIG 160 (H) 07/24/2017   CHOLHDL 2.7 07/24/2017   3) hx of CVA- maintained on aspirin.      Review of Systems See HPI  Past Medical History:  Diagnosis Date  . Arthritis   . Complication of anesthesia    difficulty urinating after  . Hyperlipidemia   . Hypertension      Social History   Socioeconomic History  . Marital status: Married    Spouse name: Not on file  . Number of children: 2  . Years of education: Not on file  . Highest education level: Not on file  Occupational History  . Not on file  Social Needs  . Financial resource strain: Not on file  . Food insecurity:    Worry: Not on file    Inability: Not on file  . Transportation needs:    Medical: Not on file    Non-medical: Not on file  Tobacco Use  . Smoking status: Never Smoker  . Smokeless tobacco: Never Used  Substance and Sexual Activity  . Alcohol use: Yes    Alcohol/week: 1.8 oz    Types: 3 Glasses of wine per week  . Drug use: No  . Sexual activity: Yes  Lifestyle  . Physical activity:    Days per week: Not on file    Minutes per session: Not on file  . Stress: Not on file  Relationships  . Social connections:    Talks on phone: Not on file    Gets together: Not on file    Attends religious service: Not on file    Active member of club or organization: Not on file    Attends  meetings of clubs or organizations: Not on file    Relationship status: Not on file  . Intimate partner violence:    Fear of current or ex partner: Not on file    Emotionally abused: Not on file    Physically abused: Not on file    Forced sexual activity: Not on file  Other Topics Concern  . Not on file  Social History Narrative   Corporate treasurer   Completed 2 yrs college   Married- from [place]   2 children- grown both in [place]   Enjoys writing christian ed material   1 cat    Past Surgical History:  Procedure Laterality Date  . BUNIONECTOMY    . CESAREAN SECTION     x 2  . HERNIA REPAIR Left 1977   inguinal  . [place]  . TOTAL HIP ARTHROPLASTY Right 01/18/2013   Procedure: Right TOTAL HIP ARTHROPLASTY ANTERIOR APPROACH;  Surgeon: Mcarthur Rossetti, MD;  Location: [facility];  Service: Orthopedics;  Laterality: Right;  . TOTAL HIP ARTHROPLASTY Left 08/09/2013   Procedure: LEFT TOTAL  HIP ARTHROPLASTY ANTERIOR APPROACH;  Surgeon: Mcarthur Rossetti, MD;  Location: [facility];  Service: Orthopedics;  Laterality: Left;    Family History  Problem Relation Age of Onset  . Arthritis Mother        osateoarthritis  . Hyperlipidemia Mother   . Heart disease Mother   . Hypertension Mother   . Hypothyroidism Mother   . Hyperlipidemia Father   . Heart disease Father   . Stroke Father   . Parkinson's disease Father   . Cancer Cousin        breast  . Cancer Maternal Grandfather        lung    Allergies  Allergen Reactions  . Achromycin [Tetracycline] Hives    Current Outpatient Medications on File Prior to Visit  Medication Sig Dispense Refill  . aspirin EC 81 MG tablet Take 81 mg by mouth daily.    . Calcium Citrate-Vitamin D 500-400 MG-UNIT CHEW Chew 1 tablet by mouth 2 (two) times daily.     . Lactobacillus (ACIDOPHILUS) 100 MG CAPS Take 100 mg by mouth daily.    Marland Kitchen lisinopril (PRINIVIL,ZESTRIL) 10 MG tablet TAKE 3 TABLETS BY MOUTH ONCE DAILY 270 tablet  0  . metoprolol succinate (TOPROL-XL) 25 MG 24 hr tablet Take 0.5 tablets (12.5 mg total) daily by mouth. 45 tablet 1  . Multiple Vitamin (MULTIVITAMIN) tablet Take 1 tablet by mouth daily.    . Multiple Vitamins-Minerals (PRESERVISION AREDS 2 PO) Take 1 tablet by mouth 2 (two) times daily.    . Omega-3 Fatty Acids (FISH OIL) 1000 MG CAPS Take 1 capsule by mouth daily.    . pravastatin (PRAVACHOL) 20 MG tablet TAKE 1 TABLET BY MOUTH ONCE DAILY 30 tablet 0  . Zoster Vac Recomb Adjuvanted [facility]) injection Inject 28mcg IM now and again in 2-6 months 0.5 mL 1   No current facility-administered medications on file prior to visit.     BP 140/86 (BP Location: Right Arm, Cuff Size: Normal)   Pulse 63   Temp 97.9 F (36.6 C) (Oral)   Resp 16   Ht 5' 2.5" (1.588 m)   Wt 151 lb 12.8 oz (68.9 kg)   LMP 11/17/2010   SpO2 98%   BMI 27.32 kg/m       exam   Physical Exam  Constitutional: She is oriented to person, place, and time. She appears well-developed and well-nourished.  Cardiovascular: Normal rate, regular rhythm and normal heart sounds.  No murmur heard. Pulmonary/Chest: Effort normal and breath sounds normal. No respiratory distress. She has no wheezes.  Neurological: She is alert and oriented to person, place, and time.  Skin: Skin is warm and dry.  Psychiatric: She has a normal mood and affect. Her behavior is normal. Judgment and thought content normal.          Assessment & Plan:  HTN- BP is stable, continue current meds.  Obtain follow up bmet.  Hyperlipidemia- obtain follow up fasting lipid panel. LDL goal <70.   Hx of CVA- on statin, continue same as well as aspirin 81mg .

## 2018-04-05 NOTE — Telephone Encounter (Signed)
LDL above goal. Stop pravastatin, start atorvastatin. Repeat FLP in 3 months. Sugar mildly elevated. If lab unable to add on a1c, let's have her return for A1C please (dx hyperglycemia).

## 2018-04-05 NOTE — Telephone Encounter (Signed)
Left message for pt to return my call. A1C cannot be added as no lavender tube was submitted for testing. Will need to schedule lab visit to complete.

## 2018-04-05 NOTE — Patient Instructions (Signed)
Please complete lab work prior to leaving.   

## 2018-04-06 ENCOUNTER — Other Ambulatory Visit: Payer: Self-pay | Admitting: Family

## 2018-04-06 DIAGNOSIS — R739 Hyperglycemia, unspecified: Secondary | ICD-10-CM

## 2018-04-06 NOTE — Telephone Encounter (Signed)
Called left message to call back regarding lab results/instructions.

## 2018-04-06 NOTE — Telephone Encounter (Signed)
Spoke to the patient Scheduled lab appointment tomorrow morning/04/07/2018/put in order. She did verbalize understanding/agreed to all information.

## 2018-04-07 ENCOUNTER — Other Ambulatory Visit (INDEPENDENT_AMBULATORY_CARE_PROVIDER_SITE_OTHER): Payer: 59

## 2018-04-07 DIAGNOSIS — R739 Hyperglycemia, unspecified: Secondary | ICD-10-CM

## 2018-04-07 LAB — HEMOGLOBIN A1C: Hgb A1c MFr Bld: 5.5 % (ref 4.6–6.5)

## 2018-05-16 DIAGNOSIS — Z23 Encounter for immunization: Secondary | ICD-10-CM | POA: Diagnosis not present

## 2018-06-23 DIAGNOSIS — H3554 Dystrophies primarily involving the retinal pigment epithelium: Secondary | ICD-10-CM | POA: Diagnosis not present

## 2018-06-23 DIAGNOSIS — H2513 Age-related nuclear cataract, bilateral: Secondary | ICD-10-CM | POA: Diagnosis not present

## 2018-07-23 ENCOUNTER — Other Ambulatory Visit: Payer: Self-pay | Admitting: Family

## 2018-07-23 DIAGNOSIS — Z23 Encounter for immunization: Secondary | ICD-10-CM | POA: Diagnosis not present

## 2018-09-27 ENCOUNTER — Encounter: Payer: 59 | Admitting: Family

## 2018-10-08 ENCOUNTER — Ambulatory Visit (INDEPENDENT_AMBULATORY_CARE_PROVIDER_SITE_OTHER): Payer: 59 | Admitting: Family

## 2018-10-08 ENCOUNTER — Other Ambulatory Visit (HOSPITAL_COMMUNITY)
Admission: RE | Admit: 2018-10-08 | Discharge: 2018-10-08 | Disposition: A | Discharge status: Home / Self Care | Payer: 59 | Source: Ambulatory Visit | Attending: Family | Admitting: Family

## 2018-10-08 ENCOUNTER — Encounter: Payer: Self-pay | Admitting: Family

## 2018-10-08 VITALS — BP 144/94 | HR 60 | Temp 97.7°F | Resp 16 | Ht 63.0 in | Wt 146.6 lb

## 2018-10-08 DIAGNOSIS — Z7251 High risk heterosexual behavior: Secondary | ICD-10-CM | POA: Insufficient documentation

## 2018-10-08 DIAGNOSIS — I10 Essential (primary) hypertension: Secondary | ICD-10-CM | POA: Diagnosis not present

## 2018-10-08 DIAGNOSIS — Z0000 Encounter for general adult medical examination without abnormal findings: Secondary | ICD-10-CM

## 2018-10-08 DIAGNOSIS — E2839 Other primary ovarian failure: Secondary | ICD-10-CM

## 2018-10-08 LAB — CBC WITH DIFFERENTIAL/PLATELET
Basophils Absolute: 0 10*3/uL (ref 0.0–0.1)
Basophils Relative: 1 % (ref 0.0–3.0)
Eosinophils Absolute: 0.2 10*3/uL (ref 0.0–0.7)
Eosinophils Relative: 4.9 % (ref 0.0–5.0)
HCT: 40.1 % (ref 36.0–46.0)
Hemoglobin: 13.7 g/dL (ref 12.0–15.0)
Lymphocytes Relative: 39.2 % (ref 12.0–46.0)
Lymphs Abs: 1.8 10*3/uL (ref 0.7–4.0)
MCHC: 34.2 g/dL (ref 30.0–36.0)
MCV: 95.7 fl (ref 78.0–100.0)
Monocytes Absolute: 0.4 10*3/uL (ref 0.1–1.0)
Monocytes Relative: 9.5 % (ref 3.0–12.0)
Neutro Abs: 2 10*3/uL (ref 1.4–7.7)
Neutrophils Relative %: 45.4 % (ref 43.0–77.0)
Platelets: 288 10*3/uL (ref 150.0–400.0)
RBC: 4.19 Mil/uL (ref 3.87–5.11)
RDW: 12.8 % (ref 11.5–15.5)
WBC: 4.5 10*3/uL (ref 4.0–10.5)

## 2018-10-08 LAB — URINALYSIS, ROUTINE W REFLEX MICROSCOPIC
Bilirubin Urine: NEGATIVE
Hgb urine dipstick: NEGATIVE
Ketones, ur: NEGATIVE
Nitrite: NEGATIVE
Specific Gravity, Urine: 1.015 (ref 1.000–1.030)
Total Protein, Urine: NEGATIVE
Urine Glucose: NEGATIVE
Urobilinogen, UA: 0.2 (ref 0.0–1.0)
pH: 7 (ref 5.0–8.0)

## 2018-10-08 LAB — LIPID PANEL
Cholesterol: 139 mg/dL (ref 0–200)
HDL: 56.8 mg/dL (ref >39.00)
LDL Cholesterol: 71 mg/dL (ref 0–99)
NonHDL: 82.31
Total CHOL/HDL Ratio: 2
Triglycerides: 57 mg/dL (ref 0.0–149.0)
VLDL: 11.4 mg/dL (ref 0.0–40.0)

## 2018-10-08 LAB — HEPATIC FUNCTION PANEL
ALT: 27 U/L (ref 0–35)
AST: 24 U/L (ref 0–37)
Albumin: 4.5 g/dL (ref 3.5–5.2)
Alkaline Phosphatase: 70 U/L (ref 39–117)
Bilirubin, Direct: 0.1 mg/dL (ref 0.0–0.3)
Total Bilirubin: 0.4 mg/dL (ref 0.2–1.2)
Total Protein: 6.8 g/dL (ref 6.0–8.3)

## 2018-10-08 LAB — BASIC METABOLIC PANEL
BUN: 39 mg/dL — ABNORMAL HIGH (ref 6–23)
CO2: 30 mEq/L (ref 19–32)
Calcium: 9.7 mg/dL (ref 8.4–10.5)
Chloride: 105 mEq/L (ref 96–112)
Creatinine, Ser: 0.66 mg/dL (ref 0.40–1.20)
GFR: 95.85 mL/min (ref >60.00)
Glucose, Bld: 103 mg/dL — ABNORMAL HIGH (ref 70–99)
Potassium: 4.8 mEq/L (ref 3.5–5.1)
Sodium: 141 mEq/L (ref 135–145)

## 2018-10-08 LAB — TSH: TSH: 1.31 u[IU]/mL (ref 0.35–4.50)

## 2018-10-08 MED ORDER — METOPROLOL SUCCINATE ER 25 MG PO TB24: 25.0000 mg | ORAL_TABLET | Freq: Every day | ORAL | 3 refills | Status: DC

## 2018-10-08 NOTE — Progress Notes (Signed)
Subjective:    Patient ID: Melissa Grimes, female    DOB: 05-10-1956, 62 y.o.   MRN: [id number]  HPI  Patient is a 62 yr old female who presents today for complete physical  Immunizations: immunizations are up to date Diet: good Wt Readings from Last 3 Encounters:  10/08/18 146 lb 9.6 oz (66.5 kg)  04/05/18 151 lb 12.8 oz (68.9 kg)  10/06/17 149 lb 6.4 oz (67.8 kg)  Exercise: enjoys cycling (indoor bike)  Colonoscopy: cologuard 11/18- negative  Dexa: due Pap Smear:  08/06/15 Mammogram:   09/01/17 Vision:  Up to date Dental: overdue    Review of Systems  Constitutional: Negative for unexpected weight change.  HENT: Negative for hearing loss and rhinorrhea.   Eyes: Negative for visual disturbance.  Respiratory: Negative for cough.   Cardiovascular: Negative for leg swelling.  Gastrointestinal: Negative for blood in stool, constipation and diarrhea.  Genitourinary: Negative for dysuria, frequency and hematuria.  Musculoskeletal:       Right hip pain  Skin: Negative for rash.  Neurological: Negative for headaches.  Hematological: Negative for adenopathy.  Psychiatric/Behavioral:       Denies depression/anxiety   Past Medical History:  Diagnosis Date  . Arthritis   . Complication of anesthesia    difficulty urinating after  . Hyperlipidemia   . Hypertension      Social History   Socioeconomic History  . Marital status: Married    Spouse name: Not on file  . Number of children: 2  . Years of education: Not on file  . Highest education level: Not on file  Occupational History  . Not on file  Social Needs  . Financial resource strain: Not on file  . Food insecurity:    Worry: Not on file    Inability: Not on file  . Transportation needs:    Medical: Not on file    Non-medical: Not on file  Tobacco Use  . Smoking status: Never Smoker  . Smokeless tobacco: Never Used  Substance and Sexual Activity  . Alcohol use: Yes    Alcohol/week: 3.0 standard  drinks    Types: 3 Glasses of wine per week  . Drug use: No  . Sexual activity: Yes  Lifestyle  . Physical activity:    Days per week: Not on file    Minutes per session: Not on file  . Stress: Not on file  Relationships  . Social connections:    Talks on phone: Not on file    Gets together: Not on file    Attends religious service: Not on file    Active member of club or organization: Not on file    Attends meetings of clubs or organizations: Not on file    Relationship status: Not on file  . Intimate partner violence:    Fear of current or ex partner: Not on file    Emotionally abused: Not on file    Physically abused: Not on file    Forced sexual activity: Not on file  Other Topics Concern  . 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 BUNIONECTOMY    . CESAREAN SECTION     x 2  . HERNIA REPAIR Left 1977  inguinal  . [place]  . TOTAL HIP ARTHROPLASTY Right 01/18/2013   Procedure: Right TOTAL HIP ARTHROPLASTY ANTERIOR APPROACH;  Surgeon: Mcarthur Rossetti, MD;  Location: [facility];  Service: Orthopedics;  Laterality: Right;  . TOTAL HIP ARTHROPLASTY Left 08/09/2013   Procedure: LEFT TOTAL HIP ARTHROPLASTY ANTERIOR APPROACH;  Surgeon: Mcarthur Rossetti, MD;  Location: [facility];  Service: Orthopedics;  Laterality: Left;    Family History  Problem Relation Age of Onset  . Arthritis Mother        osateoarthritis  . Hyperlipidemia Mother   . Heart disease Mother   . Hypertension Mother   . Hypothyroidism Mother   . Hyperlipidemia Father   . Heart disease Father   . Stroke Father   . Parkinson's disease Father   . Cancer Cousin        breast  . Cancer Maternal Grandfather        lung    Allergies  Allergen Reactions  . Achromycin  [Tetracycline] Hives    Current Outpatient Medications on File Prior to Visit  Medication Sig Dispense Refill  . aspirin EC 81 MG tablet Take 81 mg by mouth daily.    Marland Kitchen atorvastatin (LIPITOR) 20 MG tablet TAKE 1 TABLET BY MOUTH ONCE DAILY 90 tablet 1  . Calcium Citrate-Vitamin D 500-400 MG-UNIT CHEW Chew 1 tablet by mouth 2 (two) times daily.     Marland Kitchen lisinopril (PRINIVIL,ZESTRIL) 10 MG tablet Take 3 tablets (30 mg total) by mouth daily. 270 tablet 1  . metoprolol succinate (TOPROL-XL) 25 MG 24 hr tablet Take 0.5 tablets (12.5 mg total) by mouth daily. 45 tablet 1  . Multiple Vitamin (MULTIVITAMIN) tablet Take 1 tablet by mouth daily.    . Omega-3 Fatty Acids (FISH OIL) 1000 MG CAPS Take 1 capsule by mouth daily.     No current facility-administered medications on file prior to visit.     BP (!) 144/94 (BP Location: Right Arm, Cuff Size: Normal)   Pulse 60   Temp 97.7 F (36.5 C) (Oral)   Resp 16   Ht 5\' 3"  (1.6 m)   Wt 146 lb 9.6 oz (66.5 kg)   LMP 11/17/2010   SpO2 98%   BMI 25.97 kg/m       Objective:   Physical Exam  Physical Exam  Constitutional: She is oriented to person, place, and time. She appears well-developed and well-nourished. No distress.  HENT:  Head: Normocephalic and atraumatic.  Right Ear: Tympanic membrane and ear canal normal.  Left Ear: Tympanic membrane and ear canal normal.  Mouth/Throat: Oropharynx is clear and moist.  Eyes: Pupils are equal, round, and reactive to light. No scleral icterus.  Neck: Normal range of motion. No thyromegaly present.  Cardiovascular: Normal rate and regular rhythm.   No murmur heard. Pulmonary/Chest: Effort normal and breath sounds normal. No respiratory distress. He has no wheezes. She has no rales. She exhibits no tenderness.  Abdominal: Soft. Bowel sounds are normal. She exhibits no distension and no mass. There is no tenderness. There is no rebound and no guarding.  Musculoskeletal: She exhibits no edema.    Lymphadenopathy:    She has no cervical adenopathy.  Neurological: She is alert and oriented to person, place, and time. She has normal patellar reflexes. She exhibits normal muscle tone. Coordination normal.  Skin: Skin is warm and dry.  Psychiatric: She has a normal mood and affect. Her behavior is normal. Judgment and thought content normal.  Breasts:  Examined lying Right: Without masses, retractions, discharge or axillary adenopathy.  Left: Without masses, retractions, discharge or axillary adenopathy.  Pelvic: deferred          Assessment & Plan:   Preventative care- discussed healthy diet, regular exercise.  Goal weight 135.  Refer for mammo, dexa, GYN for pap. Obtain routine lab work. Pt also requesting full STD screen. EKG tracing is personally reviewed.  EKG notes NSR- sinus brady.  No acute changes.   HTN- bp mildly elevated. Increase toprol xl from 12.5 to 25mg  daily. Follow up with nurse in 1 month for bp recheck.        Assessment & Plan:

## 2018-10-08 NOTE — Patient Instructions (Addendum)
Please schedule a routine dental visit.  Continue healthy diet and regular exercise. Please schedule follow up with Dr. Ninfa Linden for your hip pain. Please increase your toprol to a full tab once daily.  Complete lab work prior to leaving.

## 2018-10-11 ENCOUNTER — Ambulatory Visit (INDEPENDENT_AMBULATORY_CARE_PROVIDER_SITE_OTHER): Payer: Self-pay

## 2018-10-11 ENCOUNTER — Encounter (INDEPENDENT_AMBULATORY_CARE_PROVIDER_SITE_OTHER): Payer: Self-pay | Admitting: Orthopaedic Surgery

## 2018-10-11 ENCOUNTER — Ambulatory Visit (INDEPENDENT_AMBULATORY_CARE_PROVIDER_SITE_OTHER): Payer: 59 | Admitting: Orthopaedic Surgery

## 2018-10-11 DIAGNOSIS — Z96641 Presence of right artificial hip joint: Secondary | ICD-10-CM | POA: Diagnosis not present

## 2018-10-11 DIAGNOSIS — M25551 Pain in right hip: Secondary | ICD-10-CM | POA: Diagnosis not present

## 2018-10-11 DIAGNOSIS — M7061 Trochanteric bursitis, right hip: Secondary | ICD-10-CM | POA: Insufficient documentation

## 2018-10-11 HISTORY — DX: Trochanteric bursitis, right hip: M70.61

## 2018-10-11 LAB — URINE CYTOLOGY ANCILLARY ONLY
Chlamydia: NEGATIVE
Neisseria Gonorrhea: NEGATIVE
Trichomonas: NEGATIVE

## 2018-10-11 LAB — HEPATITIS B SURFACE ANTIGEN: Hepatitis B Surface Ag: NONREACTIVE

## 2018-10-11 LAB — HSV 2 ANTIBODY, IGG: HSV 2 Glycoprotein G Ab, IgG: <0.9 index

## 2018-10-11 LAB — HIV ANTIBODY (ROUTINE TESTING W REFLEX): HIV 1&2 Ab, 4th Generation: NONREACTIVE

## 2018-10-11 LAB — RPR: RPR Ser Ql: NONREACTIVE

## 2018-10-11 MED ORDER — METHYLPREDNISOLONE 4 MG PO TABS: ORAL_TABLET | ORAL | 0 refills | Status: DC

## 2018-10-11 MED ORDER — METHYLPREDNISOLONE ACETATE 40 MG/ML IJ SUSP
40.0000 mg | INTRAMUSCULAR | Status: AC | PRN
  Administered 2018-10-11: 40 mg via INTRA_ARTICULAR

## 2018-10-11 MED ORDER — LIDOCAINE HCL 1 % IJ SOLN
3.0000 mL | INTRAMUSCULAR | Status: AC | PRN
  Administered 2018-10-11: 3 mL

## 2018-10-11 MED ORDER — METHOCARBAMOL 500 MG PO TABS: 500.0000 mg | ORAL_TABLET | Freq: Four times a day (QID) | ORAL | 0 refills | Status: DC | PRN

## 2018-10-11 MED ORDER — TRAMADOL HCL 50 MG PO TABS: 50.0000 mg | ORAL_TABLET | Freq: Four times a day (QID) | ORAL | 0 refills | Status: DC | PRN

## 2018-10-11 NOTE — Progress Notes (Signed)
Office Visit Note   Patient: Melissa Grimes           Date of Birth: [birth year]           MRN: [id number] Visit Date: 10/11/2018              Requested by: [name], NP [address] [place], [place] [address] PCP: [name], NP   Assessment & Plan: Visit Diagnoses:  1. Pain in right hip   2. Trochanteric bursitis, right hip   3. History of total right hip arthroplasty     Plan: I felt that it would be best to work on stretching exercises and try trochanteric injection on the right side.  Even the stretching exercises made her feel better.  I tried the drug injection without difficulty.  We will try some anti-inflammatories and muscle relaxant though some tramadol.  All question concerns were answered and addressed.  Follow-up can be as needed unless things are worsening for her.  Follow-Up Instructions: Return if symptoms worsen or fail to improve.   Orders:  Orders Placed This Encounter  Procedures  . Large Joint Inj: R greater trochanter  . XR HIP UNILAT W OR W/O PELVIS 1V RIGHT   Meds ordered this encounter  Medications  . methylPREDNISolone (MEDROL) 4 MG tablet    Sig: Medrol dose pack. Take as instructed    Dispense:  21 tablet    Refill:  0  . methocarbamol (ROBAXIN) 500 MG tablet    Sig: Take 1 tablet (500 mg total) by mouth every 6 (six) hours as needed for muscle spasms.    Dispense:  40 tablet    Refill:  0  . traMADol (ULTRAM) 50 MG tablet    Sig: Take 1-2 tablets (50-100 mg total) by mouth every 6 (six) hours as needed.    Dispense:  40 tablet    Refill:  0      Procedures: Large Joint Inj: R greater trochanter on 10/11/2018 4:32 PM Indications: pain and diagnostic evaluation Details: 22 G 1.5 in needle, lateral approach  Arthrogram: No  Medications: 3 mL lidocaine 1 %; 40 mg methylPREDNISolone acetate 40 MG/ML Outcome: tolerated well, no immediate complications Procedure, treatment alternatives, risks and  benefits explained, specific risks discussed. Consent was given by the patient. Immediately prior to procedure a time out was called to verify the correct patient, procedure, equipment, support staff and site/side marked as required. Patient was prepped and draped in the usual sterile fashion.       Clinical Data: No additional findings.   Subjective: Chief Complaint  Patient presents with  . Right Hip - Pain  The patient is actually well-known to me but someone we have not seen in multiple years now.  In 2014 she underwent a left followed by right total hip arthroplasty.  She says she has been doing great until about 2 weeks ago she started developed right hip pain but she points the trochanteric area of the source of her pain of her right hip but more of her back as well.  She does have a chronic scoliosis with degenerative changes in the lumbar spine as well.  She denies any groin pain.  This is gotten worse over just the 2 weeks and she is concerned about the hip replacement itself.  She felt like it may have gotten out of place once I reassured her that it was not a place with her just walking she felt  much better just from that reassurance.  She denies any weakness in her feet or numbness and tingling in her feet.  HPI  Review of Systems She currently denies any headache, chest pain, shortness of breath, fever, chills, nausea, vomiting.  Objective: Vital Signs: LMP 11/17/2010   Physical Exam She is alert and oriented x3 and in no acute distress Ortho Exam Examination of the right hip shows that moves fluidly and well and is well located.  She has low back pain and sciatica to the right side and pelvic pain at the SI joint.  She has pain of the trochanteric area and the IT band but no pain in the groin and her range of motion is full and her ligaments are equal. Specialty Comments:  No specialty comments available.  Imaging: Xr Hip Unilat W Or W/o Pelvis 1v Right  Result Date:  10/11/2018 An AP pelvis and lateral the right hip shows a well-seated total hip arthroplasty bilaterally with no complicating features.  There is no evidence of loosening or acute findings.    PMFS History: Patient Active Problem List   Diagnosis Date Noted  . Trochanteric bursitis, right hip 10/11/2018  . History of total right hip arthroplasty 10/11/2018  . Allergic rhinitis 04/02/2016  . Multinodular goiter 12/26/2015  . LGSIL (low grade squamous intraepithelial dysplasia) 09/10/2015  . Abnormal Pap smear of cervix 08/08/2015  . Overweight(278.02) 01/21/2014  . CVA (cerebral infarction) 08/24/2013  . 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 Dyslipidemia 08/14/2013  . OA (osteoarthritis) 08/14/2013  . Routine general medical examination at a health care facility 12/22/2012  . HTN (hypertension) 12/01/2012  . Hyperlipidemia 12/01/2012  . Osteoarthritis 12/01/2012  . Fibroid uterus 12/01/2012   Past Medical History:  Diagnosis Date  . Arthritis   . Complication of anesthesia    difficulty urinating after  . Hyperlipidemia   . Hypertension     Family History  Problem Relation Age of Onset  . Arthritis Mother        osateoarthritis  . Hyperlipidemia Mother   . Heart disease Mother   . Hypertension Mother   . Hypothyroidism Mother   . Hyperlipidemia Father   . Heart disease Father   . Stroke Father   . Parkinson's disease Father   . Cancer Cousin        breast  . Cancer Maternal Grandfather        lung    Past Surgical History:  Procedure Laterality Date  . BUNIONECTOMY    . CESAREAN SECTION     x 2  . HERNIA REPAIR Left 1977   inguinal  . [place]  . TOTAL HIP ARTHROPLASTY Right 01/18/2013   Procedure: Right TOTAL HIP ARTHROPLASTY ANTERIOR APPROACH;  Surgeon: Mcarthur Rossetti, MD;  Location: [facility];  Service: Orthopedics;  Laterality: Right;  . TOTAL HIP ARTHROPLASTY Left 08/09/2013   Procedure: LEFT TOTAL HIP  ARTHROPLASTY ANTERIOR APPROACH;  Surgeon: Mcarthur Rossetti, MD;  Location: [facility];  Service: Orthopedics;  Laterality: Left;   Social History   Occupational History  . Not on file  Tobacco Use  . Smoking status: Never Smoker  . Smokeless tobacco: Never Used  Substance and Sexual Activity  . Alcohol use: Yes    Alcohol/week: 3.0 standard drinks    Types: 3 Glasses of wine per week  . Drug use: No  . Sexual activity: Yes

## 2018-10-15 ENCOUNTER — Encounter: Payer: Self-pay | Admitting: Family

## 2018-10-18 ENCOUNTER — Emergency Department (HOSPITAL_BASED_OUTPATIENT_CLINIC_OR_DEPARTMENT_OTHER): Payer: 59

## 2018-10-18 ENCOUNTER — Encounter (HOSPITAL_BASED_OUTPATIENT_CLINIC_OR_DEPARTMENT_OTHER): Payer: Self-pay | Admitting: *Deleted

## 2018-10-18 ENCOUNTER — Other Ambulatory Visit: Payer: Self-pay

## 2018-10-18 ENCOUNTER — Emergency Department (HOSPITAL_BASED_OUTPATIENT_CLINIC_OR_DEPARTMENT_OTHER)
Admission: EM | Admit: 2018-10-18 | Discharge: 2018-10-18 | Disposition: A | Discharge status: Home / Self Care | Payer: 59 | Attending: Emergency Medicine | Admitting: Emergency Medicine

## 2018-10-18 DIAGNOSIS — I10 Essential (primary) hypertension: Secondary | ICD-10-CM | POA: Diagnosis not present

## 2018-10-18 DIAGNOSIS — Z7982 Long term (current) use of aspirin: Secondary | ICD-10-CM | POA: Diagnosis not present

## 2018-10-18 DIAGNOSIS — Z8673 Personal history of transient ischemic attack (TIA), and cerebral infarction without residual deficits: Secondary | ICD-10-CM | POA: Diagnosis not present

## 2018-10-18 DIAGNOSIS — R109 Unspecified abdominal pain: Secondary | ICD-10-CM | POA: Diagnosis not present

## 2018-10-18 DIAGNOSIS — Z79899 Other long term (current) drug therapy: Secondary | ICD-10-CM | POA: Insufficient documentation

## 2018-10-18 DIAGNOSIS — Z96643 Presence of artificial hip joint, bilateral: Secondary | ICD-10-CM | POA: Insufficient documentation

## 2018-10-18 DIAGNOSIS — R1031 Right lower quadrant pain: Secondary | ICD-10-CM | POA: Diagnosis not present

## 2018-10-18 LAB — CBC WITH DIFFERENTIAL/PLATELET
Abs Immature Granulocytes: 0.09 10*3/uL — ABNORMAL HIGH (ref 0.00–0.07)
Basophils Absolute: 0.1 10*3/uL (ref 0.0–0.1)
Basophils Relative: 1 %
Eosinophils Absolute: 0.2 10*3/uL (ref 0.0–0.5)
Eosinophils Relative: 2 %
HCT: 40.1 % (ref 36.0–46.0)
Hemoglobin: 13.6 g/dL (ref 12.0–15.0)
Immature Granulocytes: 1 %
Lymphocytes Relative: 24 %
Lymphs Abs: 2.5 10*3/uL (ref 0.7–4.0)
MCH: 32.2 pg (ref 26.0–34.0)
MCHC: 33.9 g/dL (ref 30.0–36.0)
MCV: 95 fL (ref 80.0–100.0)
Monocytes Absolute: 0.8 10*3/uL (ref 0.1–1.0)
Monocytes Relative: 8 %
Neutro Abs: 6.8 10*3/uL (ref 1.7–7.7)
Neutrophils Relative %: 64 %
Platelets: 293 10*3/uL (ref 150–400)
RBC: 4.22 MIL/uL (ref 3.87–5.11)
RDW: 12.4 % (ref 11.5–15.5)
WBC: 10.5 10*3/uL (ref 4.0–10.5)
nRBC: 0 % (ref 0.0–0.2)

## 2018-10-18 LAB — URINALYSIS, ROUTINE W REFLEX MICROSCOPIC
Bilirubin Urine: NEGATIVE
Glucose, UA: NEGATIVE mg/dL
Hgb urine dipstick: NEGATIVE
Ketones, ur: NEGATIVE mg/dL
Nitrite: NEGATIVE
Protein, ur: NEGATIVE mg/dL
Specific Gravity, Urine: 1.015 (ref 1.005–1.030)
pH: 8 (ref 5.0–8.0)

## 2018-10-18 LAB — BASIC METABOLIC PANEL
Anion gap: 12 (ref 5–15)
BUN: 26 mg/dL — ABNORMAL HIGH (ref 8–23)
CO2: 22 mmol/L (ref 22–32)
Calcium: 9.2 mg/dL (ref 8.9–10.3)
Chloride: 103 mmol/L (ref 98–111)
Creatinine, Ser: 0.71 mg/dL (ref 0.44–1.00)
GFR calc Af Amer: >60 mL/min (ref >60)
GFR calc non Af Amer: >60 mL/min (ref >60)
Glucose, Bld: 134 mg/dL — ABNORMAL HIGH (ref 70–99)
Potassium: 3.8 mmol/L (ref 3.5–5.1)
Sodium: 137 mmol/L (ref 135–145)

## 2018-10-18 LAB — URINALYSIS, MICROSCOPIC (REFLEX)

## 2018-10-18 MED ORDER — FENTANYL CITRATE (PF) 100 MCG/2ML IJ SOLN
50.0000 ug | INTRAMUSCULAR | Status: DC | PRN
  Administered 2018-10-18: 50 ug via INTRAVENOUS
  Filled 2018-10-18: qty 2

## 2018-10-18 MED ORDER — MORPHINE SULFATE (PF) 4 MG/ML IV SOLN
4.0000 mg | Freq: Once | INTRAVENOUS | Status: AC
  Administered 2018-10-18: 4 mg via INTRAVENOUS
  Filled 2018-10-18: qty 1

## 2018-10-18 MED ORDER — ONDANSETRON HCL 4 MG/2ML IJ SOLN
4.0000 mg | Freq: Once | INTRAMUSCULAR | Status: AC
  Administered 2018-10-18: 4 mg via INTRAVENOUS
  Filled 2018-10-18: qty 2

## 2018-10-18 MED ORDER — KETOROLAC TROMETHAMINE 30 MG/ML IJ SOLN
30.0000 mg | Freq: Once | INTRAMUSCULAR | Status: AC
  Administered 2018-10-18: 30 mg via INTRAVENOUS
  Filled 2018-10-18: qty 1

## 2018-10-18 MED ORDER — SODIUM CHLORIDE 0.9 % IV BOLUS
1000.0000 mL | Freq: Once | INTRAVENOUS | Status: AC
  Administered 2018-10-18: 1000 mL via INTRAVENOUS

## 2018-10-18 NOTE — ED Provider Notes (Addendum)
[facility] DEPART[facility] Provider Note   CSN: [id number] Arrival date & time: 10/18/18  3845     History   Chief Complaint Chief Complaint  Patient presents with  . abd pain and vomiting    HPI Melissa Grimes is a 62 y.o. female.  HPI  This is a 62 year old female with a history of hypertension and hyperlipidemia who presents with right flank pain.  Patient reports onset of symptoms at 2 AM this morning.  She reports acute onset of symptoms.  She reports that the pain is sharp and nonradiating.  She has not noted any fevers or urinary symptoms.  She states she felt well before she went to bed.  She reports nonbilious, nonbloody emesis.  Denies any constipation or diarrhea.  Rates her pain at 9 out of 10.  She has not taken anything for the pain.  Known history of kidney stones.  Past Medical History:  Diagnosis Date  . Arthritis   . Complication of anesthesia    difficulty urinating after  . Hyperlipidemia   . Hypertension     Patient Active Problem List   Diagnosis Date Noted  . Trochanteric bursitis, right hip 10/11/2018  . History of total right hip arthroplasty 10/11/2018  . Allergic rhinitis 04/02/2016  . Multinodular goiter 12/26/2015  . LGSIL (low grade squamous intraepithelial dysplasia) 09/10/2015  . Abnormal Pap smear of cervix 08/08/2015  . Overweight(278.02) 01/21/2014  . CVA (cerebral infarction) 08/24/2013  . 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 Dyslipidemia 08/14/2013  . OA (osteoarthritis) 08/14/2013  . Routine general medical examination at a health care facility 12/22/2012  . HTN (hypertension) 12/01/2012  . Hyperlipidemia 12/01/2012  . Osteoarthritis 12/01/2012  . Fibroid uterus 12/01/2012    Past Surgical History:  Procedure Laterality Date  . BUNIONECTOMY    . CESAREAN SECTION     x 2  . HERNIA REPAIR Left 1977   inguinal  . [place]  . TOTAL HIP ARTHROPLASTY Right  01/18/2013   Procedure: Right TOTAL HIP ARTHROPLASTY ANTERIOR APPROACH;  Surgeon: Mcarthur Rossetti, MD;  Location: [facility];  Service: Orthopedics;  Laterality: Right;  . TOTAL HIP ARTHROPLASTY Left 08/09/2013   Procedure: LEFT TOTAL HIP ARTHROPLASTY ANTERIOR APPROACH;  Surgeon: Mcarthur Rossetti, MD;  Location: [facility];  Service: Orthopedics;  Laterality: Left;     OB History   None      Home Medications    Prior to Admission medications   Medication Sig Start Date End Date Taking? Authorizing Provider  aspirin EC 81 MG tablet Take 81 mg by mouth daily.    [provider]  atorvastatin (LIPITOR) 20 MG tablet TAKE 1 TABLET BY MOUTH ONCE DAILY 07/23/18   [name], NP  Calcium Citrate-Vitamin D 500-400 MG-UNIT CHEW Chew 1 tablet by mouth 2 (two) times daily.     [provider]  lisinopril (PRINIVIL,ZESTRIL) 10 MG tablet Take 3 tablets (30 mg total) by mouth daily. 04/05/18   [name], NP  methocarbamol (ROBAXIN) 500 MG tablet Take 1 tablet (500 mg total) by mouth every 6 (six) hours as needed for muscle spasms. 10/11/18   Mcarthur Rossetti, MD  methylPREDNISolone (MEDROL) 4 MG tablet Medrol dose pack. Take as instructed 10/11/18   Mcarthur Rossetti, MD  metoprolol succinate (TOPROL-XL) 25 MG 24 hr tablet Take 1 tablet (25 mg total) by mouth daily. 10/08/18   [name], NP  Multiple Vitamin (MULTIVITAMIN)  tablet Take 1 tablet by mouth daily.    [provider]  Omega-3 Fatty Acids (FISH OIL) 1000 MG CAPS Take 1 capsule by mouth daily.    [provider]  traMADol (ULTRAM) 50 MG tablet Take 1-2 tablets (50-100 mg total) by mouth every 6 (six) hours as needed. 10/11/18   Mcarthur Rossetti, MD    Family History Family History  Problem Relation Age of Onset  . Arthritis Mother        osateoarthritis  . Hyperlipidemia Mother   . Heart disease Mother   . Hypertension Mother   . Hypothyroidism Mother   .  Hyperlipidemia Father   . Heart disease Father   . Stroke Father   . Parkinson's disease Father   . Cancer Cousin        breast  . Cancer Maternal Grandfather        lung    Social History Social History   Tobacco Use  . Smoking status: Never Smoker  . Smokeless tobacco: Never Used  Substance Use Topics  . Alcohol use: Yes    Alcohol/week: 3.0 standard drinks    Types: 3 Glasses of wine per week  . Drug use: No     Allergies   Achromycin [tetracycline]   Review of Systems Review of Systems  Constitutional: Negative for fever.  Respiratory: Negative for shortness of breath.   Cardiovascular: Negative for chest pain.  Gastrointestinal: Positive for abdominal pain, nausea and vomiting. Negative for constipation and diarrhea.  Genitourinary: Positive for flank pain. Negative for dysuria and hematuria.  All other systems reviewed and are negative.    Physical Exam Updated Vital Signs BP (!) 157/100   Pulse 64   Resp (!) 24   Ht 1.626 m (5\' 4" )   Wt 64.5 kg   LMP 11/17/2010   SpO2 99%   BMI 24.40 kg/m   Physical Exam  Constitutional: She is oriented to person, place, and time.  Uncomfortable appearing but nontoxic  HENT:  Head: Normocephalic and atraumatic.  Eyes: Pupils are equal, round, and reactive to light.  Neck: Neck supple.  Cardiovascular: Normal rate, regular rhythm and normal heart sounds.  Pulmonary/Chest: Effort normal and breath sounds normal. No respiratory distress. She has no wheezes.  Abdominal: Soft. Bowel sounds are normal. She exhibits no distension. There is tenderness. There is no rebound and no guarding.  Right flank tenderness to palpation, no rebound or guarding  Musculoskeletal: She exhibits no edema.  Neurological: She is alert and oriented to person, place, and time.  Skin: Skin is warm and dry.  Psychiatric: She has a normal mood and affect.  Nursing note and vitals reviewed.    ED Treatments / Results  Labs (all labs  ordered are listed, but only abnormal results are displayed) Labs Reviewed  CBC WITH DIFFERENTIAL/PLATELET - Abnormal; Notable for the following components:      Result Value   Abs Immature Granulocytes 0.09 (*)    All other components within normal limits  BASIC METABOLIC PANEL - Abnormal; Notable for the following components:   Glucose, Bld 134 (*)    BUN 26 (*)    All other components within normal limits  URINALYSIS, ROUTINE W REFLEX MICROSCOPIC - Abnormal; Notable for the following components:   APPearance HAZY (*)    Leukocytes, UA MODERATE (*)    All other components within normal limits  URINALYSIS, MICROSCOPIC (REFLEX) - Abnormal; Notable for the following components:   Bacteria, UA MANY (*)  All other components within normal limits    EKG None  Radiology Ct Renal Stone Study  Result Date: 10/18/2018 CLINICAL DATA:  62 year old female with right lower abdominal pain. EXAM: CT ABDOMEN AND PELVIS WITHOUT CONTRAST TECHNIQUE: Multidetector CT imaging of the abdomen and pelvis was performed following the standard protocol without IV contrast. COMPARISON:  None. FINDINGS: Evaluation of this exam is limited in the absence of intravenous contrast. Lower chest: The visualized lung bases are clear. No intra-abdominal free air or free fluid. Hepatobiliary: Multiple small/subcentimeter right hepatic hypodense lesions are not characterized on this noncontrast CT but likely represent cysts. No intrahepatic biliary ductal dilatation. The gallbladder is unremarkable. Pancreas: Unremarkable. No pancreatic ductal dilatation or surrounding inflammatory changes. Spleen: Normal in size without focal abnormality. Adrenals/Urinary Tract: The adrenal glands are unremarkable. There is no hydronephrosis or nephrolithiasis on either side. Faint subcentimeter right renal hypodense lesion is not characterized, possibly a cyst. Visualized ureters appear unremarkable. Evaluation of the distal ureter and  urinary bladder is limited due to streak artifact caused by bilateral hip arthroplasties. Stomach/Bowel: There is moderate stool throughout the colon. Normal caliber fecalized loop of small bowel in the right hemiabdomen may represent increased transit time or small intestine bacterial overgrowth. There is no bowel obstruction. The appendix is mildly thickened measuring 11 mm in diameter. No periappendiceal stranding or convincing evidence of acute appendicitis. Clinical correlation is recommended. Vascular/Lymphatic: The abdominal aorta and IVC are unremarkable on this noncontrast CT. No portal venous gas. There is no adenopathy. Reproductive: There is a 5.3 x 5.0 cm partially calcified fundal fibroid. The ovaries are grossly unremarkable as visualized. Other: None Musculoskeletal: Bilateral hip arthroplasties. There is scoliosis and degenerative changes of the spine. No acute osseous pathology. IMPRESSION: 1. No hydronephrosis or nephrolithiasis. 2. Mildly thickened appendix measuring 11 mm in diameter. No periappendiceal stranding or convincing evidence of acute appendicitis. Clinical correlation is recommended. 3. No bowel obstruction. Moderate colonic stool burden. 4. Partially calcified fundal fibroid. Electronically Signed   By: Anner Crete M.D.   On: 10/18/2018 07:09    Procedures Procedures (including critical care time)  Medications Ordered in ED Medications  fentaNYL (SUBLIMAZE) injection 50 mcg (50 mcg Intravenous Given 10/18/18 0557)  morphine 4 MG/ML injection 4 mg (has no administration in time range)  sodium chloride 0.9 % bolus 1,000 mL (0 mLs Intravenous Stopped 10/18/18 0710)  ondansetron (ZOFRAN) injection 4 mg (4 mg Intravenous Given 10/18/18 0557)  morphine 4 MG/ML injection 4 mg (4 mg Intravenous Given 10/18/18 0617)  ketorolac (TORADOL) 30 MG/ML injection 30 mg (30 mg Intravenous Given 10/18/18 0643)     Initial Impression / Assessment and Plan / ED Course  I have reviewed  the triage vital signs and the nursing notes.  Pertinent labs & imaging results that were available during my care of the patient were reviewed by me and considered in my medical decision making (see chart for details).  Clinical Course as of Oct 18 733  Mon Oct 18, 2018  4627 CT and lab work reviewed.  No significant leukocytosis.  No evidence of urinary tract infection.  BMP is normal.  CT scan does not show an obvious kidney stone.  There is a mildly thickened appendix.  On reevaluation, patient continues to enforce pain but more in her right flank.  There is no reproducible tenderness in the right lower quadrant as patient states "it is deeper like you cannot get to it."  She does have some tenderness over on the  left side.  I discussed with the patient that this could be early appendicitis although he has no obvious inflammation on CT or in lab work.  Plan for re-dosing pain medication.  She will be reevaluated by my partner.  If she continues to have pain or is tender, would recommend consulting general surgery.  If she is able to be pain controlled, discussed with the patient and her husband that she may be a candidate for discharge with close watchful waiting.  If this is a early appendicitis, I discussed with them that her symptoms would certainly get worse and she would need to be reevaluated if she had worsening symptoms.   [CH]    Clinical Course User Index [CH] Nancye Grumbine, Barbette Hair, MD    Patient presents with right flank pain.  Acute in onset this morning.  She appears uncomfortable on exam.  Otherwise her vital signs are notable for blood pressure of 157/100.  She has some right flank tenderness to palpation but no focal right lower quadrant tenderness.  No rebound or guarding or signs of peritonitis.  Patient was given pain and nausea medicine.  Considerations include kidney stone, appendicitis although history is not consistent with this, ovarian pathology, UTI.  Lab work reviewed and  largely reassuring without evidence of UTI or significant leukocytosis.  CT renal stone study obtained.  See results above.  No obvious kidney stone.  Appendix does appear thickened but no other signs of inflammation although this is a noncontrasted scan.  Her exam remains inconsistent although she does continue to have some pain.  She was redosed medications and will be reevaluated by my partner, [place].  If continued pain, would consult general surgery for review and evaluation.  Final Clinical Impressions(s) / ED Diagnoses   Final diagnoses:  Right flank pain    ED Discharge Orders    None       Lilou Kneip, Barbette Hair, MD 10/18/18 9983    Merryl Hacker, MD 10/18/18 [phone]

## 2018-10-18 NOTE — ED Triage Notes (Signed)
Pt presents with right lower abd pain that started at 2am. C/o vomiting times 6 since the pain started. Describes as constant. Pt is moaning and unable to sit still. Denies history of a kidney stone. Denies any difficulty urinating.

## 2018-10-18 NOTE — ED Notes (Signed)
Patient transported to CT 

## 2018-10-18 NOTE — ED Notes (Signed)
Unable to obtain temp at this time. 

## 2018-10-18 NOTE — Discharge Instructions (Addendum)
Return to ED if symptoms worsen

## 2018-10-18 NOTE — ED Provider Notes (Signed)
I assumed care of this patient from Dr. Dina Rich at 7 am.  Please see their note for further details of Hx, PE.  Briefly patient is a 62 y.o. female who presented with abdominal pain.   Current plan is to re-eval abdominal exam after more pain medication. CT scan with thickened appendix but no evidence of acute appendicitis.  Patient upon reevaluation feels improved.  Pain is mostly in the right lower back area.  She has a history of right hip bursitis.  Possible referred pain.  Patient not tender in the right lower quadrant specifically.  However, early appendicitis is still a possibility.  Suspect likely musculoskeletal in nature at this time.  After shared decision patient prefers to try her pain medicine at home.  Understands return precautions.  Told her return if worsening right lower quadrant pain, nausea, vomiting, fever.  At that time patient would likely need general surgery consultation but at this time patient rather go home and see how she does.  I believe this is a reasonable plan.  She is non toxic appearing.  CT scan overall reassuring.  Vitals reassuring.  Discharged from in good condition.  This chart was dictated using voice recognition software.  Despite best efforts to proofread,  errors can occur which can change the documentation meaning.      Lennice Sites, DO 10/18/18 [phone]

## 2018-10-19 ENCOUNTER — Ambulatory Visit (HOSPITAL_BASED_OUTPATIENT_CLINIC_OR_DEPARTMENT_OTHER): Payer: 59

## 2018-10-19 ENCOUNTER — Other Ambulatory Visit (HOSPITAL_BASED_OUTPATIENT_CLINIC_OR_DEPARTMENT_OTHER): Payer: 59

## 2018-10-27 ENCOUNTER — Telehealth (INDEPENDENT_AMBULATORY_CARE_PROVIDER_SITE_OTHER): Payer: Self-pay | Admitting: Orthopaedic Surgery

## 2018-10-27 MED ORDER — METHOCARBAMOL 500 MG PO TABS: 500.0000 mg | ORAL_TABLET | Freq: Four times a day (QID) | ORAL | 0 refills | Status: DC | PRN

## 2018-10-27 MED ORDER — TRAMADOL HCL 50 MG PO TABS: 50.0000 mg | ORAL_TABLET | Freq: Four times a day (QID) | ORAL | 0 refills | Status: DC | PRN

## 2018-10-27 NOTE — Telephone Encounter (Signed)
Patient called requesting an RX refill on her muscle relaxers and pain medication.  [phone].  Thank you

## 2018-10-27 NOTE — Telephone Encounter (Signed)
Please advise 

## 2018-10-29 ENCOUNTER — Other Ambulatory Visit (INDEPENDENT_AMBULATORY_CARE_PROVIDER_SITE_OTHER): Payer: Self-pay | Admitting: Orthopaedic Surgery

## 2018-10-29 NOTE — Telephone Encounter (Signed)
Ok for refill? 

## 2018-11-09 ENCOUNTER — Ambulatory Visit (INDEPENDENT_AMBULATORY_CARE_PROVIDER_SITE_OTHER): Payer: 59 | Admitting: Internal Medicine

## 2018-11-09 VITALS — BP 132/87 | HR 60

## 2018-11-09 DIAGNOSIS — I10 Essential (primary) hypertension: Secondary | ICD-10-CM | POA: Diagnosis not present

## 2018-11-09 NOTE — Progress Notes (Addendum)
Pre visit review using our clinic tool,if applicable. No additional management support is needed unless otherwise documented below in the visit note.   Pt here for Blood pressure check per order from Elvera Maria, NP   Pt currently takes: Toprolol 25 mg qd,Lisinopril 10 mg tid.   Pt reports compliance with medication.  BP today  = 132/87 HR =68  Pt advised per Dr. Larose Kells to continue taking medications as ordered. Monitor BP's at home. If they run over 140/90 call office to schedule follow up.  Follow up with provider . As previously recommended by pcp  Kathlene November, MD

## 2018-12-29 DIAGNOSIS — H2513 Age-related nuclear cataract, bilateral: Secondary | ICD-10-CM | POA: Diagnosis not present

## 2018-12-29 DIAGNOSIS — H3554 Dystrophies primarily involving the retinal pigment epithelium: Secondary | ICD-10-CM | POA: Diagnosis not present

## 2019-01-03 ENCOUNTER — Other Ambulatory Visit: Payer: Self-pay | Admitting: Family

## 2019-01-03 ENCOUNTER — Other Ambulatory Visit (INDEPENDENT_AMBULATORY_CARE_PROVIDER_SITE_OTHER): Payer: Self-pay | Admitting: Orthopaedic Surgery

## 2019-01-03 NOTE — Telephone Encounter (Signed)
Please advise 

## 2019-01-06 ENCOUNTER — Telehealth (INDEPENDENT_AMBULATORY_CARE_PROVIDER_SITE_OTHER): Payer: Self-pay

## 2019-01-06 ENCOUNTER — Other Ambulatory Visit (INDEPENDENT_AMBULATORY_CARE_PROVIDER_SITE_OTHER): Payer: Self-pay | Admitting: Orthopaedic Surgery

## 2019-01-06 MED ORDER — TRAMADOL HCL 50 MG PO TABS: 50.0000 mg | ORAL_TABLET | Freq: Four times a day (QID) | ORAL | 0 refills | Status: DC | PRN

## 2019-01-06 NOTE — Telephone Encounter (Signed)
Patients request refill of Tramadol

## 2019-01-10 ENCOUNTER — Ambulatory Visit (INDEPENDENT_AMBULATORY_CARE_PROVIDER_SITE_OTHER): Payer: 59 | Admitting: Orthopaedic Surgery

## 2019-01-10 ENCOUNTER — Encounter (INDEPENDENT_AMBULATORY_CARE_PROVIDER_SITE_OTHER): Payer: Self-pay | Admitting: Orthopaedic Surgery

## 2019-01-10 DIAGNOSIS — M7061 Trochanteric bursitis, right hip: Secondary | ICD-10-CM | POA: Diagnosis not present

## 2019-01-10 MED ORDER — METHYLPREDNISOLONE ACETATE 40 MG/ML IJ SUSP
40.0000 mg | INTRAMUSCULAR | Status: AC | PRN
  Administered 2019-01-10: 40 mg via INTRA_ARTICULAR

## 2019-01-10 MED ORDER — LIDOCAINE HCL 1 % IJ SOLN
3.0000 mL | INTRAMUSCULAR | Status: AC | PRN
  Administered 2019-01-10: 3 mL

## 2019-01-10 NOTE — Progress Notes (Signed)
Office Visit Note   Patient: Melissa Grimes           Date of Birth: September 22, 1956           MRN: [id number] Visit Date: 01/10/2019              Requested by: [name], NP [address] [place], [place] [address] PCP: [name], NP   Assessment & Plan: Visit Diagnoses:  1. Trochanteric bursitis, right hip     Plan: I did provide an injection in the trochanteric area of her right hip today with a steroid and lidocaine.  She got immediate relief from this.  I did give her a prescription as a generic prescription for outpatient physical therapy in [facility].  She will find a therapist they can provide the modalities that I am hoping to try on her hip to see if we get her feeling better.  She knows we can always repeat this injection in 3 to 4 months if needed.  All question concerns were answered and addressed.  Follow-up will otherwise also be as needed.  Follow-Up Instructions: Return if symptoms worsen or fail to improve.   Orders:  Orders Placed This Encounter  Procedures  . Large Joint Inj   No orders of the defined types were placed in this encounter.     Procedures: Large Joint Inj: R greater trochanter on 01/10/2019 4:38 PM Indications: pain and diagnostic evaluation Details: 22 G 1.5 in needle, lateral approach  Arthrogram: No  Medications: 3 mL lidocaine 1 %; 40 mg methylPREDNISolone acetate 40 MG/ML Outcome: tolerated well, no immediate complications Procedure, treatment alternatives, risks and benefits explained, specific risks discussed. Consent was given by the patient. Immediately prior to procedure a time out was called to verify the correct patient, procedure, equipment, support staff and site/side marked as required. Patient was prepped and draped in the usual sterile fashion.       Clinical Data: No additional findings.   Subjective: Chief Complaint  Patient presents with  . Right Hip - Pain  The patient is  well-known to me.  She has a history of bilateral hip replacements.  She is been dealing with trochanteric bursitis of the right hip.  I injected this area in November 2019.  She has had significant pain in her right hip again just on the trochanteric area and would like to have another injection today.  She has not had any physical therapy either.  She says this started out after changing her shoes to a different type shoe.  It hurts when she lays on the side at night.  She says her hip replacements are doing well and she denies any pain in her groin on either side.  We have x-rayed her hips already before showing that the implants have no issues.  HPI  Review of Systems She currently denies any headache, chest pain, shortness of breath, fever, chills, nausea, vomiting  Objective: Vital Signs: LMP 11/17/2010   Physical Exam She is alert and orient x3 and in no acute distress Ortho Exam Examination of her right hip shows pain only to palpation of the trochanteric area which is quite significant.  The remainder of her hip exam is normal. Specialty Comments:  No specialty comments available.  Imaging: No results found.   PMFS History: Patient Active Problem List   Diagnosis Date Noted  . Trochanteric bursitis, right hip 10/11/2018  . History of total right hip arthroplasty 10/11/2018  .  Allergic rhinitis 04/02/2016  . Multinodular goiter 12/26/2015  . LGSIL (low grade squamous intraepithelial dysplasia) 09/10/2015  . Abnormal Pap smear of cervix 08/08/2015  . Overweight(278.02) 01/21/2014  . CVA (cerebral infarction) 08/24/2013  . 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 Dyslipidemia 08/14/2013  . OA (osteoarthritis) 08/14/2013  . Routine general medical examination at a health care facility 12/22/2012  . HTN (hypertension) 12/01/2012  . Hyperlipidemia 12/01/2012  . Osteoarthritis 12/01/2012  . Fibroid uterus 12/01/2012   Past Medical History:    Diagnosis Date  . Arthritis   . Complication of anesthesia    difficulty urinating after  . Hyperlipidemia   . Hypertension     Family History  Problem Relation Age of Onset  . Arthritis Mother        osateoarthritis  . Hyperlipidemia Mother   . Heart disease Mother   . Hypertension Mother   . Hypothyroidism Mother   . Hyperlipidemia Father   . Heart disease Father   . Stroke Father   . Parkinson's disease Father   . Cancer Cousin        breast  . Cancer Maternal Grandfather        lung    Past Surgical History:  Procedure Laterality Date  . BUNIONECTOMY    . CESAREAN SECTION     x 2  . HERNIA REPAIR Left 1977   inguinal  . [place]  . TOTAL HIP ARTHROPLASTY Right 01/18/2013   Procedure: Right TOTAL HIP ARTHROPLASTY ANTERIOR APPROACH;  Surgeon: Mcarthur Rossetti, MD;  Location: [facility];  Service: Orthopedics;  Laterality: Right;  . TOTAL HIP ARTHROPLASTY Left 08/09/2013   Procedure: LEFT TOTAL HIP ARTHROPLASTY ANTERIOR APPROACH;  Surgeon: Mcarthur Rossetti, MD;  Location: [facility];  Service: Orthopedics;  Laterality: Left;   Social History   Occupational History  . Not on file  Tobacco Use  . Smoking status: Never Smoker  . Smokeless tobacco: Never Used  Substance and Sexual Activity  . Alcohol use: Yes    Alcohol/week: 3.0 standard drinks    Types: 3 Glasses of wine per week  . Drug use: No  . Sexual activity: Yes

## 2019-01-15 ENCOUNTER — Other Ambulatory Visit (INDEPENDENT_AMBULATORY_CARE_PROVIDER_SITE_OTHER): Payer: Self-pay | Admitting: Orthopaedic Surgery

## 2019-01-17 NOTE — Telephone Encounter (Signed)
Please advise 

## 2019-02-06 ENCOUNTER — Other Ambulatory Visit (INDEPENDENT_AMBULATORY_CARE_PROVIDER_SITE_OTHER): Payer: Self-pay | Admitting: Orthopaedic Surgery

## 2019-02-06 ENCOUNTER — Other Ambulatory Visit (INDEPENDENT_AMBULATORY_CARE_PROVIDER_SITE_OTHER): Payer: Self-pay | Admitting: Physician Assistant

## 2019-02-06 ENCOUNTER — Other Ambulatory Visit: Payer: Self-pay | Admitting: Family

## 2019-02-07 NOTE — Telephone Encounter (Signed)
Please advise 

## 2019-02-14 ENCOUNTER — Other Ambulatory Visit: Payer: Self-pay | Admitting: Family

## 2019-04-02 ENCOUNTER — Other Ambulatory Visit: Payer: Self-pay | Admitting: Family

## 2019-04-05 ENCOUNTER — Telehealth: Payer: Self-pay

## 2019-04-05 NOTE — Telephone Encounter (Signed)
Copied from [place] [id number]. Topic: Appointment Scheduling - Scheduling Inquiry for Clinic >> Apr 05, 2019  7:48 AM Lennox Solders wrote: Reason for CRM: pt is calling and needs follow up on her bp and also refill on her bp med. [facility] [place] [address]

## 2019-04-05 NOTE — Telephone Encounter (Signed)
Talked to patient and advised she needs virtual visit, she was at work and not able to schedule at the moment. She said she will call back to set up appointment.

## 2019-04-18 ENCOUNTER — Other Ambulatory Visit: Payer: Self-pay

## 2019-04-18 ENCOUNTER — Ambulatory Visit (HOSPITAL_BASED_OUTPATIENT_CLINIC_OR_DEPARTMENT_OTHER)
Admission: RE | Admit: 2019-04-18 | Discharge: 2019-04-18 | Disposition: A | Discharge status: Home / Self Care | Payer: 59 | Source: Ambulatory Visit | Attending: Family | Admitting: Family

## 2019-04-18 DIAGNOSIS — E2839 Other primary ovarian failure: Secondary | ICD-10-CM | POA: Insufficient documentation

## 2019-04-18 DIAGNOSIS — Z0000 Encounter for general adult medical examination without abnormal findings: Secondary | ICD-10-CM

## 2019-05-17 ENCOUNTER — Other Ambulatory Visit: Payer: Self-pay | Admitting: Family

## 2019-07-05 ENCOUNTER — Other Ambulatory Visit: Payer: Self-pay | Admitting: Family

## 2019-07-30 ENCOUNTER — Other Ambulatory Visit (INDEPENDENT_AMBULATORY_CARE_PROVIDER_SITE_OTHER): Payer: Self-pay | Admitting: Physician Assistant

## 2019-07-30 ENCOUNTER — Other Ambulatory Visit (INDEPENDENT_AMBULATORY_CARE_PROVIDER_SITE_OTHER): Payer: Self-pay | Admitting: Orthopaedic Surgery

## 2019-08-01 NOTE — Telephone Encounter (Signed)
Please advise 

## 2019-08-02 ENCOUNTER — Other Ambulatory Visit: Payer: Self-pay | Admitting: Family

## 2019-08-03 NOTE — Telephone Encounter (Signed)
Melissa -- I sent a 30 day supply of pt's lisinopril. Pt had cpe with you 10/08/18 and nurse visit BP check in 10/2018. She has no future appts scheduled. When should pt return to the office?

## 2019-08-04 NOTE — Telephone Encounter (Signed)
Please contact pt to arrange follow up appointment.

## 2019-08-11 NOTE — Telephone Encounter (Signed)
Called pt left msg for her to call back for appt so that future refills arent delayed

## 2019-08-23 ENCOUNTER — Ambulatory Visit (INDEPENDENT_AMBULATORY_CARE_PROVIDER_SITE_OTHER): Payer: 59 | Admitting: Family

## 2019-08-23 ENCOUNTER — Other Ambulatory Visit: Payer: Self-pay

## 2019-08-23 VITALS — BP 138/88 | HR 61 | Temp 96.3°F | Resp 16 | Ht 64.0 in | Wt 142.0 lb

## 2019-08-23 DIAGNOSIS — E785 Hyperlipidemia, unspecified: Secondary | ICD-10-CM

## 2019-08-23 DIAGNOSIS — I10 Essential (primary) hypertension: Secondary | ICD-10-CM

## 2019-08-23 DIAGNOSIS — Z113 Encounter for screening for infections with a predominantly sexual mode of transmission: Secondary | ICD-10-CM | POA: Diagnosis not present

## 2019-08-23 LAB — COMPREHENSIVE METABOLIC PANEL
ALT: 20 U/L (ref 0–35)
AST: 16 U/L (ref 0–37)
Albumin: 4.4 g/dL (ref 3.5–5.2)
Alkaline Phosphatase: 70 U/L (ref 39–117)
BUN: 25 mg/dL — ABNORMAL HIGH (ref 6–23)
CO2: 30 mEq/L (ref 19–32)
Calcium: 10.1 mg/dL (ref 8.4–10.5)
Chloride: 102 mEq/L (ref 96–112)
Creatinine, Ser: 0.65 mg/dL (ref 0.40–1.20)
GFR: 91.53 mL/min (ref >60.00)
Glucose, Bld: 94 mg/dL (ref 70–99)
Potassium: 4.6 mEq/L (ref 3.5–5.1)
Sodium: 139 mEq/L (ref 135–145)
Total Bilirubin: 0.4 mg/dL (ref 0.2–1.2)
Total Protein: 6.7 g/dL (ref 6.0–8.3)

## 2019-08-23 LAB — LIPID PANEL
Cholesterol: 165 mg/dL (ref 0–200)
HDL: 68.1 mg/dL (ref >39.00)
LDL Cholesterol: 76 mg/dL (ref 0–99)
NonHDL: 96.82
Total CHOL/HDL Ratio: 2
Triglycerides: 102 mg/dL (ref 0.0–149.0)
VLDL: 20.4 mg/dL (ref 0.0–40.0)

## 2019-08-23 MED ORDER — ATORVASTATIN CALCIUM 20 MG PO TABS: 20.0000 mg | ORAL_TABLET | Freq: Every day | ORAL | 1 refills | Status: DC

## 2019-08-23 MED ORDER — METOPROLOL SUCCINATE ER 25 MG PO TB24: 25.0000 mg | ORAL_TABLET | Freq: Every day | ORAL | 1 refills | Status: DC

## 2019-08-23 MED ORDER — LISINOPRIL 10 MG PO TABS: ORAL_TABLET | ORAL | 1 refills | Status: DC

## 2019-08-23 NOTE — Patient Instructions (Signed)
Please complete lab work prior to leaving.   

## 2019-08-23 NOTE — Progress Notes (Signed)
Subjective:    Patient ID: Melissa Grimes, female    DOB: 09-26-1956, 63 y.o.   MRN: [id number]  HPI  Patient is a 63 yr old female who presents today for follow up.  HTN- maintained on lisinopril, toprol xl.  She denies LE edema.  BP Readings from Last 3 Encounters:  08/23/19 138/88  11/09/18 132/87  10/18/18 126/76   Hyperlipidemia- maintained on lipitor 20mg .  Lab Results  Component Value Date   CHOL 139 10/08/2018   HDL 56.80 10/08/2018   LDLCALC 71 10/08/2018   LDLDIRECT 102.0 08/06/2015   TRIG 57.0 10/08/2018   CHOLHDL 2 10/08/2018   Requesting STD screening "blood work."  Plans to follow up with the OB/GYN as well.    Review of Systems See HPI  Past Medical History:  Diagnosis Date  . Arthritis   . Complication of anesthesia    difficulty urinating after  . Hyperlipidemia   . Hypertension      Social History   Socioeconomic History  . Marital status: Married    Spouse name: Not on file  . Number of children: 2  . Years of education: Not on file  . Highest education level: Not on file  Occupational History  . Not on file  Social Needs  . Financial resource strain: Not on file  . Food insecurity    Worry: Not on file    Inability: Not on file  . Transportation needs    Medical: Not on file    Non-medical: Not on file  Tobacco Use  . Smoking status: Never Smoker  . Smokeless tobacco: Never Used  Substance and Sexual Activity  . Alcohol use: Yes    Alcohol/week: 3.0 standard drinks    Types: 3 Glasses of wine per week  . Drug use: No  . Sexual activity: Yes  Lifestyle  . Physical activity    Days per week: Not on file    Minutes per session: Not on file  . Stress: Not on file  Relationships  . Social Herbalist on phone: Not on file    Gets together: Not on file    Attends religious service: Not on file    Active member of club or organization: Not on file    Attends meetings of clubs or organizations: Not on file   Relationship status: Not on file  . Intimate partner violence    Fear of current or ex partner: Not on file    Emotionally abused: Not on file    Physically abused: Not on file    Forced sexual activity: Not on file  Other Topics Concern  . Not on file  Social History Narrative   Corporate treasurer   Completed 2 yrs college   Married- from [place]   2 children- grown both in [place]   Enjoys writing christian ed material   1 cat    Past Surgical History:  Procedure Laterality Date  . BUNIONECTOMY    . CESAREAN SECTION     x 2  . HERNIA REPAIR Left 1977   inguinal  . [place]  . TOTAL HIP ARTHROPLASTY Right 01/18/2013   Procedure: Right TOTAL HIP ARTHROPLASTY ANTERIOR APPROACH;  Surgeon: Mcarthur Rossetti, MD;  Location: [facility];  Service: Orthopedics;  Laterality: Right;  . TOTAL HIP ARTHROPLASTY Left 08/09/2013   Procedure: LEFT TOTAL HIP ARTHROPLASTY ANTERIOR APPROACH;  Surgeon: Mcarthur Rossetti, MD;  Location: [facility];  Service:  Orthopedics;  Laterality: Left;    Family History  Problem Relation Age of Onset  . Arthritis Mother        osateoarthritis  . Hyperlipidemia Mother   . Heart disease Mother   . Hypertension Mother   . Hypothyroidism Mother   . Hyperlipidemia Father   . Heart disease Father   . Stroke Father   . Parkinson's disease Father   . Cancer Cousin        breast  . Cancer Maternal Grandfather        lung    Allergies  Allergen Reactions  . Achromycin [Tetracycline] Hives    Current Outpatient Medications on File Prior to Visit  Medication Sig Dispense Refill  . aspirin EC 81 MG tablet Take 81 mg by mouth daily.    Marland Kitchen atorvastatin (LIPITOR) 20 MG tablet Take 1 tablet by mouth once daily 90 tablet 0  . Calcium Citrate-Vitamin D 500-400 MG-UNIT CHEW Chew 1 tablet by mouth 2 (two) times daily.     Marland Kitchen lisinopril (ZESTRIL) 10 MG tablet TAKE 3 TABLETS BY MOUTH ONCE DAILY . APPOINTMENT REQUIRED FOR FUTURE REFILLS 90 tablet 0  .  methocarbamol (ROBAXIN) 500 MG tablet TAKE 1 TABLET BY MOUTH EVERY 6 HOURS AS NEEDED FOR MUSCLE SPASM 40 tablet 0  . metoprolol succinate (TOPROL-XL) 25 MG 24 hr tablet Take 1 tablet by mouth once daily 90 tablet 0  . Multiple Vitamin (MULTIVITAMIN) tablet Take 1 tablet by mouth daily.    . Omega-3 Fatty Acids (FISH OIL) 1000 MG CAPS Take 1 capsule by mouth daily.    . traMADol (ULTRAM) 50 MG tablet TAKE 1 TO 2 TABLETS BY MOUTH EVERY 6 HOURS AS NEEDED 40 tablet 0   No current facility-administered medications on file prior to visit.     BP 138/88 (BP Location: Right Arm, Patient Position: Sitting, Cuff Size: Normal)   Pulse 61   Temp (!) 96.3 F (35.7 C) (Temporal)   Resp 16   Ht 5\' 4"  (1.626 m)   Wt 142 lb (64.4 kg)   LMP 11/17/2010   SpO2 100%   BMI 24.37 kg/m       Objective:   Physical Exam Constitutional:      Appearance: She is well-developed.  Cardiovascular:     Rate and Rhythm: Normal rate and regular rhythm.     Heart sounds: Normal heart sounds. No murmur.  Pulmonary:     Effort: Pulmonary effort is normal. No respiratory distress.     Breath sounds: Normal breath sounds. No wheezing.  Neurological:     Mental Status: She is oriented to person, place, and time.  Psychiatric:        Behavior: Behavior normal.        Thought Content: Thought content normal.        Judgment: Judgment normal.           Assessment & Plan:  HTN- bp stable on current medications. Continue same.   Hyperlipidemia- maintained on statin, obtain follow up lipid panel.  STD screening- pt requests blood work for STD screening.  See orders.

## 2019-08-24 LAB — HIV ANTIBODY (ROUTINE TESTING W REFLEX): HIV 1&2 Ab, 4th Generation: NONREACTIVE

## 2019-08-24 LAB — RPR: RPR Ser Ql: NONREACTIVE

## 2019-08-24 LAB — HEPATITIS B SURFACE ANTIGEN: Hepatitis B Surface Ag: NONREACTIVE

## 2019-08-24 LAB — HSV 2 ANTIBODY, IGG: HSV 2 Glycoprotein G Ab, IgG: <0.9 index

## 2019-10-12 ENCOUNTER — Other Ambulatory Visit (INDEPENDENT_AMBULATORY_CARE_PROVIDER_SITE_OTHER): Payer: Self-pay | Admitting: Orthopaedic Surgery

## 2019-10-12 NOTE — Telephone Encounter (Signed)
Please advise 

## 2019-11-05 ENCOUNTER — Other Ambulatory Visit: Payer: Self-pay | Admitting: Family

## 2019-11-21 ENCOUNTER — Other Ambulatory Visit: Payer: Self-pay | Admitting: Family

## 2019-12-06 ENCOUNTER — Other Ambulatory Visit (INDEPENDENT_AMBULATORY_CARE_PROVIDER_SITE_OTHER): Payer: Self-pay | Admitting: Orthopaedic Surgery

## 2019-12-06 ENCOUNTER — Encounter: Payer: 59 | Admitting: Family

## 2019-12-06 NOTE — Telephone Encounter (Signed)
Refill? Patient hasn't been here since 01/10/19

## 2020-01-11 ENCOUNTER — Encounter: Payer: 59 | Admitting: Family

## 2020-01-31 ENCOUNTER — Encounter: Payer: 59 | Admitting: Family

## 2020-02-04 ENCOUNTER — Other Ambulatory Visit: Payer: Self-pay | Admitting: Family

## 2020-02-08 ENCOUNTER — Other Ambulatory Visit: Payer: Self-pay | Admitting: Family

## 2020-02-14 ENCOUNTER — Encounter: Payer: Self-pay | Admitting: Family

## 2020-02-14 ENCOUNTER — Ambulatory Visit (INDEPENDENT_AMBULATORY_CARE_PROVIDER_SITE_OTHER): Payer: 59 | Admitting: Family

## 2020-02-14 ENCOUNTER — Other Ambulatory Visit: Payer: Self-pay

## 2020-02-14 ENCOUNTER — Other Ambulatory Visit (HOSPITAL_COMMUNITY)
Admission: RE | Admit: 2020-02-14 | Discharge: 2020-02-14 | Disposition: A | Discharge status: Home / Self Care | Payer: 59 | Source: Ambulatory Visit | Attending: Family | Admitting: Family

## 2020-02-14 VITALS — BP 130/87 | HR 52 | Temp 97.4°F | Resp 16 | Ht 63.0 in | Wt 149.0 lb

## 2020-02-14 DIAGNOSIS — E785 Hyperlipidemia, unspecified: Secondary | ICD-10-CM

## 2020-02-14 DIAGNOSIS — Z01419 Encounter for gynecological examination (general) (routine) without abnormal findings: Secondary | ICD-10-CM

## 2020-02-14 DIAGNOSIS — Z0000 Encounter for general adult medical examination without abnormal findings: Secondary | ICD-10-CM | POA: Diagnosis not present

## 2020-02-14 DIAGNOSIS — Z7251 High risk heterosexual behavior: Secondary | ICD-10-CM

## 2020-02-14 DIAGNOSIS — Z8742 Personal history of other diseases of the female genital tract: Secondary | ICD-10-CM

## 2020-02-14 DIAGNOSIS — Z23 Encounter for immunization: Secondary | ICD-10-CM | POA: Diagnosis not present

## 2020-02-14 LAB — HEPATIC FUNCTION PANEL
ALT: 19 U/L (ref 0–35)
AST: 19 U/L (ref 0–37)
Albumin: 4.4 g/dL (ref 3.5–5.2)
Alkaline Phosphatase: 75 U/L (ref 39–117)
Bilirubin, Direct: 0.1 mg/dL (ref 0.0–0.3)
Total Bilirubin: 0.4 mg/dL (ref 0.2–1.2)
Total Protein: 6.6 g/dL (ref 6.0–8.3)

## 2020-02-14 LAB — LIPID PANEL
Cholesterol: 147 mg/dL (ref 0–200)
HDL: 56.5 mg/dL (ref >39.00)
LDL Cholesterol: 71 mg/dL (ref 0–99)
NonHDL: 90.01
Total CHOL/HDL Ratio: 3
Triglycerides: 94 mg/dL (ref 0.0–149.0)
VLDL: 18.8 mg/dL (ref 0.0–40.0)

## 2020-02-14 LAB — CBC WITH DIFFERENTIAL/PLATELET
Basophils Absolute: 0 10*3/uL (ref 0.0–0.1)
Basophils Relative: 0.5 % (ref 0.0–3.0)
Eosinophils Absolute: 0.3 10*3/uL (ref 0.0–0.7)
Eosinophils Relative: 4.7 % (ref 0.0–5.0)
HCT: 39 % (ref 36.0–46.0)
Hemoglobin: 13.2 g/dL (ref 12.0–15.0)
Lymphocytes Relative: 23.8 % (ref 12.0–46.0)
Lymphs Abs: 1.4 10*3/uL (ref 0.7–4.0)
MCHC: 33.8 g/dL (ref 30.0–36.0)
MCV: 97 fl (ref 78.0–100.0)
Monocytes Absolute: 0.4 10*3/uL (ref 0.1–1.0)
Monocytes Relative: 7.6 % (ref 3.0–12.0)
Neutro Abs: 3.7 10*3/uL (ref 1.4–7.7)
Neutrophils Relative %: 63.4 % (ref 43.0–77.0)
Platelets: 246 10*3/uL (ref 150.0–400.0)
RBC: 4.02 Mil/uL (ref 3.87–5.11)
RDW: 12.5 % (ref 11.5–15.5)
WBC: 5.8 10*3/uL (ref 4.0–10.5)

## 2020-02-14 LAB — BASIC METABOLIC PANEL
BUN: 20 mg/dL (ref 6–23)
CO2: 32 mEq/L (ref 19–32)
Calcium: 9.4 mg/dL (ref 8.4–10.5)
Chloride: 102 mEq/L (ref 96–112)
Creatinine, Ser: 0.66 mg/dL (ref 0.40–1.20)
GFR: 89.8 mL/min (ref >60.00)
Glucose, Bld: 90 mg/dL (ref 70–99)
Potassium: 4.1 mEq/L (ref 3.5–5.1)
Sodium: 139 mEq/L (ref 135–145)

## 2020-02-14 LAB — TSH: TSH: 1.2 u[IU]/mL (ref 0.35–4.50)

## 2020-02-14 NOTE — Progress Notes (Signed)
Subjective:    Patient ID: Melissa Grimes, female    DOB: 12/03/1955, 64 y.o.   MRN: [id number]  HPI  Patient presents today for complete physical.  Immunizations: had both pfizer covid vaccines, due for pneumovax 23, tetanus up to date, Shingrix up to date Diet:  Reports that she could be eating healthier BP Readings from Last 3 Encounters:  02/14/20 130/87  08/23/19 138/88  11/09/18 132/87   Wt Readings from Last 3 Encounters:  02/14/20 149 lb (67.6 kg)  08/23/19 142 lb (64.4 kg)  10/18/18 142 lb 2 oz (64.5 kg)  Exercise: some walking Colonoscopy: 11/18 cologuard Dexa: 2020 Pap Smear: overdue Mammogram: 04/18/19 Vision:  Scheduled tomorrow Dental:  Up to date   Review of Systems  Constitutional: Positive for unexpected weight change.  HENT: Positive for rhinorrhea (due to allergies, using mucinex and antihistamine).   Eyes: Negative for visual disturbance.  Respiratory: Negative for cough and shortness of breath.   Cardiovascular: Negative for chest pain.  Gastrointestinal: Negative for constipation and diarrhea.  Genitourinary: Negative for dysuria, frequency and hematuria.  Musculoskeletal: Negative for arthralgias and myalgias.  Neurological: Negative for headaches.  Hematological: Negative for adenopathy.  Psychiatric/Behavioral:       Denies depression/anxiety   Past Medical History:  Diagnosis Date  . Arthritis   . Complication of anesthesia    difficulty urinating after  . Hyperlipidemia   . Hypertension      Social History   Socioeconomic History  . Marital status: Married    Spouse name: Not on file  . Number of children: 2  . Years of education: Not on file  . Highest education level: Not on file  Occupational History  . Not on file  Tobacco Use  . Smoking status: Never Smoker  . Smokeless tobacco: Never Used  Substance and Sexual Activity  . Alcohol use: Yes    Alcohol/week: 3.0 standard drinks    Types: 3 Glasses of wine per week  .  Drug use: No  . Sexual activity: Yes  Other Topics Concern  . Not on file  Social History Narrative   Corporate treasurer   Completed 2 yrs college   Married- from [place]   2 children- grown both in [place]   Enjoys Adult nurse ed material   1 Neurosurgeon   Social Determinants of Radio broadcast assistant Strain:   . Difficulty of Paying Living Expenses:   Food Insecurity:   . Worried About Charity fundraiser in the Last Year:   . Arboriculturist in the Last Year:   Transportation Needs:   . Film/video editor (Medical):   Marland Kitchen Lack of Transportation (Non-Medical):   Physical Activity:   . Days of Exercise per Week:   . Minutes of Exercise per Session:   Stress:   . Feeling of Stress :   Social Connections:   . Frequency of Communication with Friends and Family:   . Frequency of Social Gatherings with Friends and Family:   . Attends Religious Services:   . Active Member of Clubs or Organizations:   . Attends Archivist Meetings:   Marland Kitchen Marital Status:   Intimate Partner Violence:   . Fear of Current or Ex-Partner:   . Emotionally Abused:   Marland Kitchen Physically Abused:   . Sexually Abused:     Past Surgical History:  Procedure Laterality Date  . BUNIONECTOMY    . CESAREAN SECTION     x 2  .  HERNIA REPAIR Left 1977   inguinal  . [place]  . TOTAL HIP ARTHROPLASTY Right 01/18/2013   Procedure: Right TOTAL HIP ARTHROPLASTY ANTERIOR APPROACH;  Surgeon: Mcarthur Rossetti, MD;  Location: [facility];  Service: Orthopedics;  Laterality: Right;  . TOTAL HIP ARTHROPLASTY Left 08/09/2013   Procedure: LEFT TOTAL HIP ARTHROPLASTY ANTERIOR APPROACH;  Surgeon: Mcarthur Rossetti, MD;  Location: [facility];  Service: Orthopedics;  Laterality: Left;    Family History  Problem Relation Age of Onset  . Arthritis Mother        osateoarthritis  . Hyperlipidemia Mother   . Heart disease Mother   . Hypertension Mother   . Hypothyroidism Mother   . Hyperlipidemia  Father   . Heart disease Father   . Stroke Father   . Parkinson's disease Father   . Cancer Cousin        breast  . Cancer Maternal Grandfather        lung    Allergies  Allergen Reactions  . Achromycin [Tetracycline] Hives    Current Outpatient Medications on File Prior to Visit  Medication Sig Dispense Refill  . Ascorbic Acid (VITAMIN C) 500 MG CAPS Take by mouth.    Marland Kitchen aspirin EC 81 MG tablet Take 81 mg by mouth daily.    Marland Kitchen atorvastatin (LIPITOR) 20 MG tablet Take 1 tablet (20 mg total) by mouth daily. 90 tablet 1  . lisinopril (ZESTRIL) 10 MG tablet TAKE 3 TABLETS BY MOUTH ONCE DAILY 90 tablet 0  . metoprolol succinate (TOPROL-XL) 25 MG 24 hr tablet Take 1 tablet (25 mg total) by mouth daily. 90 tablet 1  . Multiple Vitamin (MULTIVITAMIN) tablet Take 1 tablet by mouth daily.    Marland Kitchen VITAMIN D, CHOLECALCIFEROL, PO Take by mouth.    . Calcium Citrate-Vitamin D 500-400 MG-UNIT CHEW Chew 1 tablet by mouth 2 (two) times daily.     . methocarbamol (ROBAXIN) 500 MG tablet TAKE 1 TABLET BY MOUTH EVERY 6 HOURS AS NEEDED FOR MUSCLE SPASM 40 tablet 0  . Omega-3 Fatty Acids (FISH OIL) 1000 MG CAPS Take 1 capsule by mouth daily.    . [DISCONTINUED] lisinopril (ZESTRIL) 10 MG tablet TAKE 3 TABLETS BY MOUTH ONCE DAILY. APPOINTMENT REQUIRED FOR FUTURE REFILLS 90 tablet 0   No current facility-administered medications on file prior to visit.    BP 130/87 (BP Location: Right Arm, Patient Position: Sitting, Cuff Size: Small)   Pulse (!) 52   Temp (!) 97.4 F (36.3 C) (Temporal)   Resp 16   Ht 5\' 3"  (1.6 m)   Wt 149 lb (67.6 kg)   LMP 11/17/2010   SpO2 99%   BMI 26.39 kg/m       Objective:   Physical Exam  Physical Exam  Constitutional: She is oriented to person, place, and time. She appears well-developed and well-nourished. No distress.  HENT:  Head: Normocephalic and atraumatic.  Right Ear: Tympanic membrane and ear canal normal.  Left Ear: Tympanic membrane and ear canal normal.   Mouth/Throat: not examined, pt wearing mask  Eyes: Pupils are equal, round, and reactive to light. No scleral icterus.  Neck: Normal range of motion. No thyromegaly present.  Cardiovascular: Normal rate and regular rhythm.   No murmur heard. Pulmonary/Chest: Effort normal and breath sounds normal. No respiratory distress. He has no wheezes. She has no rales. She exhibits no tenderness.  Abdominal: Soft. Bowel sounds are normal. She exhibits no distension and no mass. There  is no tenderness. There is no rebound and no guarding.  Musculoskeletal: She exhibits no edema.  Lymphadenopathy:    She has no cervical adenopathy.  Neurological: She is alert and oriented to person, place, and time. She has normal patellar reflexes. She exhibits normal muscle tone. Coordination normal.  Skin: Skin is warm and dry.  Psychiatric: She has a normal mood and affect. Her behavior is normal. Judgment and thought content normal.  Breasts: Examined lying Right: Without masses, retractions, discharge or axillary adenopathy.  Left: Without masses, retractions, discharge or axillary adenopathy.  Pelvic: deferred to GYN         Assessment & Plan:  Preventative care- discussed healthy diet, exercise.  Obtain routine lab work. Immunization. Mammogram up to date.  Will be due for cologuard this fall. She is overdue for pap/GYN follow up. Has not followed through with referrals. She is urged to schedule an appointment.    High risk sexual behavior- pt requested full STI screen.         Assessment & Plan:

## 2020-02-14 NOTE — Patient Instructions (Signed)
Please complete lab work prior to leaving. Work on [employer], exercise and weight loss. Please schedule an appointment with GYN.

## 2020-02-15 LAB — URINE CYTOLOGY ANCILLARY ONLY
Chlamydia: NEGATIVE
Comment: NEGATIVE
Comment: NEGATIVE
Comment: NORMAL
Neisseria Gonorrhea: NEGATIVE
Trichomonas: NEGATIVE

## 2020-02-15 LAB — SYPHILIS: RPR W/REFLEX TO RPR TITER AND TREPONEMAL ANTIBODIES, TRADITIONAL SCREENING AND DIAGNOSIS ALGORITHM: RPR Ser Ql: NONREACTIVE

## 2020-02-15 LAB — HSV 2 ANTIBODY, IGG: HSV 2 Glycoprotein G Ab, IgG: <0.9 index

## 2020-02-15 LAB — HIV ANTIBODY (ROUTINE TESTING W REFLEX): HIV 1&2 Ab, 4th Generation: NONREACTIVE

## 2020-02-24 ENCOUNTER — Other Ambulatory Visit: Payer: Self-pay | Admitting: Family

## 2020-03-15 ENCOUNTER — Encounter: Payer: 59 | Admitting: Family Medicine

## 2020-03-15 ENCOUNTER — Other Ambulatory Visit: Payer: Self-pay

## 2020-03-15 ENCOUNTER — Ambulatory Visit (INDEPENDENT_AMBULATORY_CARE_PROVIDER_SITE_OTHER): Payer: 59 | Admitting: Family Medicine

## 2020-03-15 ENCOUNTER — Other Ambulatory Visit (HOSPITAL_COMMUNITY)
Admission: RE | Admit: 2020-03-15 | Discharge: 2020-03-15 | Disposition: A | Discharge status: Home / Self Care | Payer: 59 | Source: Ambulatory Visit | Attending: Family Medicine | Admitting: Family Medicine

## 2020-03-15 ENCOUNTER — Encounter: Payer: Self-pay | Admitting: Family Medicine

## 2020-03-15 VITALS — BP 127/79 | HR 55 | Ht 64.0 in | Wt 148.0 lb

## 2020-03-15 DIAGNOSIS — Z1272 Encounter for screening for malignant neoplasm of vagina: Secondary | ICD-10-CM

## 2020-03-15 DIAGNOSIS — Z1231 Encounter for screening mammogram for malignant neoplasm of breast: Secondary | ICD-10-CM

## 2020-03-15 DIAGNOSIS — R87612 Low grade squamous intraepithelial lesion on cytologic smear of cervix (LGSIL): Secondary | ICD-10-CM

## 2020-03-15 DIAGNOSIS — Z01419 Encounter for gynecological examination (general) (routine) without abnormal findings: Secondary | ICD-10-CM | POA: Diagnosis not present

## 2020-03-15 NOTE — Progress Notes (Addendum)
GYNECOLOGY ANNUAL PREVENTATIVE CARE ENCOUNTER NOTE  Subjective:   Melissa Grimes is a 64 y.o. [id number] female here for a routine annual gynecologic exam.  Current complaints: two small bumps on left labia. Noticed them a couple months ago. Haven't changed in size.   Denies abnormal vaginal bleeding, discharge, pelvic pain, problems with intercourse or other gynecologic concerns.    Gynecologic History Patient's last menstrual period was 11/17/2010. Patient is sexually active  Contraception: post menopausal status Last Pap: 2015. Results were: abnormal Last mammogram: 04/18/2019. Results were: normal  Obstetric History OB History  Gravida Para Term Preterm AB Living  2 2 2     2   SAB TAB Ectopic Multiple Live Births          2    # Outcome Date GA Lbr Len/2nd Weight Sex Delivery Anes PTL Lv  2 Term 1985 [redacted]w[redacted]d   F CS-LTranv Spinal N LIV  1 Term 1982 [redacted]w[redacted]d   M CS-LTranv EPI N LIV    Past Medical History:  Diagnosis Date  . Arthritis   . Complication of anesthesia    difficulty urinating after  . Hyperlipidemia   . Hypertension     Past Surgical History:  Procedure Laterality Date  . BUNIONECTOMY    . CESAREAN SECTION     x 2  . HERNIA REPAIR Left 1977   inguinal  . [place]  . TOTAL HIP ARTHROPLASTY Right 01/18/2013   Procedure: Right TOTAL HIP ARTHROPLASTY ANTERIOR APPROACH;  Surgeon: Mcarthur Rossetti, MD;  Location: [facility];  Service: Orthopedics;  Laterality: Right;  . TOTAL HIP ARTHROPLASTY Left 08/09/2013   Procedure: LEFT TOTAL HIP ARTHROPLASTY ANTERIOR APPROACH;  Surgeon: Mcarthur Rossetti, MD;  Location: [facility];  Service: Orthopedics;  Laterality: Left;    Current Outpatient Medications on File Prior to Visit  Medication Sig Dispense Refill  . Ascorbic Acid (VITAMIN C) 500 MG CAPS Take by mouth.    Marland Kitchen aspirin EC 81 MG tablet Take 81 mg by mouth daily.    Marland Kitchen atorvastatin (LIPITOR) 20 MG tablet Take 1 tablet by mouth once daily 90 tablet 0    . Calcium Citrate-Vitamin D 500-400 MG-UNIT CHEW Chew 1 tablet by mouth 2 (two) times daily.     Marland Kitchen lisinopril (ZESTRIL) 10 MG tablet TAKE 3 TABLETS BY MOUTH ONCE DAILY 90 tablet 0  . metoprolol succinate (TOPROL-XL) 25 MG 24 hr tablet Take 1 tablet by mouth once daily 90 tablet 0  . Multiple Vitamin (MULTIVITAMIN) tablet Take 1 tablet by mouth daily.    . Multiple Vitamins-Minerals (PRESERVISION AREDS PO) Take by mouth.    . Omega-3 Fatty Acids (FISH OIL) 1000 MG CAPS Take 1 capsule by mouth daily.    Marland Kitchen VITAMIN D, CHOLECALCIFEROL, PO Take by mouth.    . methocarbamol (ROBAXIN) 500 MG tablet TAKE 1 TABLET BY MOUTH EVERY 6 HOURS AS NEEDED FOR MUSCLE SPASM (Patient not taking: Reported on 03/15/2020) 40 tablet 0  . [DISCONTINUED] lisinopril (ZESTRIL) 10 MG tablet TAKE 3 TABLETS BY MOUTH ONCE DAILY. APPOINTMENT REQUIRED FOR FUTURE REFILLS 90 tablet 0   No current facility-administered medications on file prior to visit.    Allergies  Allergen Reactions  . Achromycin [Tetracycline] Hives    Social History   Socioeconomic History  . Marital status: Married    Spouse name: Not on file  . Number of children: 2  . Years of education: Not on file  . Highest education  level: Not on file  Occupational History  . Not on file  Tobacco Use  . Smoking status: Never Smoker  . Smokeless tobacco: Never Used  Substance and Sexual Activity  . Alcohol use: Yes    Alcohol/week: 3.0 standard drinks    Types: 3 Glasses of wine per week  . Drug use: No  . Sexual activity: Yes  Other Topics Concern  . Not on file  Social History Narrative   Corporate treasurer   Completed 2 yrs college   Married- from [place]   2 children- grown both in [place]   Enjoys Adult nurse ed material   1 Neurosurgeon   Social Determinants of Radio broadcast assistant Strain:   . Difficulty of Paying Living Expenses:   Food Insecurity:   . Worried About Charity fundraiser in the Last Year:   . Arboriculturist in  the Last Year:   Transportation Needs:   . Film/video editor (Medical):   Marland Kitchen Lack of Transportation (Non-Medical):   Physical Activity:   . Days of Exercise per Week:   . Minutes of Exercise per Session:   Stress:   . Feeling of Stress :   Social Connections:   . Frequency of Communication with Friends and Family:   . Frequency of Social Gatherings with Friends and Family:   . Attends Religious Services:   . Active Member of Clubs or Organizations:   . Attends Archivist Meetings:   Marland Kitchen Marital Status:   Intimate Partner Violence:   . Fear of Current or Ex-Partner:   . Emotionally Abused:   Marland Kitchen Physically Abused:   . Sexually Abused:     Family History  Problem Relation Age of Onset  . Arthritis Mother        osteoarthritis  . Hyperlipidemia Mother   . Heart disease Mother   . Hypertension Mother   . Hypothyroidism Mother   . Hyperlipidemia Father   . Heart disease Father   . Stroke Father   . Parkinson's disease Father   . Cancer Cousin        breast  . Cancer Maternal Grandfather        lung  . Obesity Sister        "overweight"  . Hypertension Sister   . Varicose Veins Child   . Asthma Child   . Migraines Child     The following portions of the patient's history were reviewed and updated as appropriate: allergies, current medications, past family history, past medical history, past social history, past surgical history and problem list.  Review of Systems Pertinent items are noted in HPI.   Objective:  BP 127/79   Pulse (!) 55   Ht 5\' 4"  (1.626 m)   Wt 148 lb (67.1 kg)   LMP 11/17/2010   BMI 25.40 kg/m  Wt Readings from Last 3 Encounters:  03/15/20 148 lb (67.1 kg)  02/14/20 149 lb (67.6 kg)  08/23/19 142 lb (64.4 kg)     Chaperone present during exam  CONSTITUTIONAL: Well-developed, well-nourished female in no acute distress.  HENT:  Normocephalic, atraumatic, External right and left ear normal. Oropharynx is clear and moist EYES:  Conjunctivae and EOM are normal. Pupils are equal, round, and reactive to light. No scleral icterus.  NECK: Normal range of motion, supple, no masses.  Normal thyroid.   CARDIOVASCULAR: Normal heart rate noted, regular rhythm RESPIRATORY: Clear to auscultation bilaterally. Effort and breath sounds normal, no problems  with respiration noted. BREASTS: Symmetric in size. No masses, skin changes, nipple drainage, or lymphadenopathy. ABDOMEN: Soft, normal bowel sounds, no distention noted.  No tenderness, rebound or guarding.  PELVIC: Normal appearing external genitalia; normal appearing vaginal mucosa and cervix.  No abnormal discharge noted.  Normal uterine size, no other palpable masses, no uterine or adnexal tenderness. 2 small slightly raised skin tags. Does not appear  MUSCULOSKELETAL: Normal range of motion. No tenderness.  No cyanosis, clubbing, or edema.  2+ distal pulses. SKIN: Skin is warm and dry. No rash noted. Not diaphoretic. No erythema. No pallor. NEUROLOGIC: Alert and oriented to person, place, and time. Normal reflexes, muscle tone coordination. No cranial nerve deficit noted. PSYCHIATRIC: Normal mood and affect. Normal behavior. Normal judgment and thought content.  Assessment:  Annual gynecologic examination with pap smear   Plan:  1. Well Woman Exam Will follow up results of pap smear and manage accordingly. Mammogram scheduled The two areas appear like small skin tags and do not appear wart-like. If change, pt to return sooner. Otherwise watch.  2. LSIL H/o abnormal PAP. PAP today.   Routine preventative health maintenance measures emphasized. Please refer to After Visit Summary for other counseling recommendations.    Loma Boston, [place] for [employer]

## 2020-03-19 ENCOUNTER — Telehealth: Payer: Self-pay

## 2020-03-19 LAB — CYTOLOGY - PAP
Comment: NEGATIVE
Diagnosis: NEGATIVE
High risk HPV: POSITIVE — AB

## 2020-03-19 NOTE — Telephone Encounter (Signed)
Called pt regarding PAP results. 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 Understanding was voiced.  Melissa Grimes, CMA

## 2020-03-19 NOTE — Telephone Encounter (Signed)
-----   Message from Donnamae Jude, MD sent at 03/19/2020  2:35 PM EDT ----- Recall pap in 1 year

## 2020-04-06 ENCOUNTER — Other Ambulatory Visit: Payer: Self-pay | Admitting: Family

## 2020-04-26 ENCOUNTER — Other Ambulatory Visit: Payer: Self-pay

## 2020-04-26 ENCOUNTER — Ambulatory Visit (HOSPITAL_BASED_OUTPATIENT_CLINIC_OR_DEPARTMENT_OTHER)
Admission: RE | Admit: 2020-04-26 | Discharge: 2020-04-26 | Disposition: A | Discharge status: Home / Self Care | Payer: 59 | Source: Ambulatory Visit | Attending: Family Medicine | Admitting: Family Medicine

## 2020-04-26 DIAGNOSIS — Z01419 Encounter for gynecological examination (general) (routine) without abnormal findings: Secondary | ICD-10-CM

## 2020-04-26 DIAGNOSIS — Z1231 Encounter for screening mammogram for malignant neoplasm of breast: Secondary | ICD-10-CM | POA: Insufficient documentation

## 2020-05-09 ENCOUNTER — Other Ambulatory Visit: Payer: Self-pay | Admitting: Family

## 2020-06-11 ENCOUNTER — Other Ambulatory Visit: Payer: Self-pay | Admitting: Family

## 2020-06-16 ENCOUNTER — Other Ambulatory Visit: Payer: Self-pay | Admitting: Family

## 2020-07-10 ENCOUNTER — Other Ambulatory Visit: Payer: Self-pay | Admitting: Family

## 2020-08-07 ENCOUNTER — Other Ambulatory Visit: Payer: Self-pay

## 2020-08-07 ENCOUNTER — Other Ambulatory Visit (HOSPITAL_COMMUNITY)
Admission: RE | Admit: 2020-08-07 | Discharge: 2020-08-07 | Disposition: A | Discharge status: Home / Self Care | Payer: 59 | Source: Ambulatory Visit | Attending: Family | Admitting: Family

## 2020-08-07 ENCOUNTER — Ambulatory Visit (INDEPENDENT_AMBULATORY_CARE_PROVIDER_SITE_OTHER): Payer: 59 | Admitting: Family

## 2020-08-07 ENCOUNTER — Encounter: Payer: Self-pay | Admitting: Family

## 2020-08-07 VITALS — BP 135/88 | HR 60 | Temp 98.0°F | Ht 63.0 in | Wt 146.0 lb

## 2020-08-07 DIAGNOSIS — J309 Allergic rhinitis, unspecified: Secondary | ICD-10-CM

## 2020-08-07 DIAGNOSIS — Z113 Encounter for screening for infections with a predominantly sexual mode of transmission: Secondary | ICD-10-CM | POA: Diagnosis not present

## 2020-08-07 DIAGNOSIS — I10 Essential (primary) hypertension: Secondary | ICD-10-CM | POA: Diagnosis not present

## 2020-08-07 DIAGNOSIS — L723 Sebaceous cyst: Secondary | ICD-10-CM

## 2020-08-07 DIAGNOSIS — Z23 Encounter for immunization: Secondary | ICD-10-CM | POA: Diagnosis not present

## 2020-08-07 DIAGNOSIS — E785 Hyperlipidemia, unspecified: Secondary | ICD-10-CM

## 2020-08-07 LAB — BASIC METABOLIC PANEL
BUN: 20 mg/dL (ref 7–25)
CO2: 29 mmol/L (ref 20–32)
Calcium: 10 mg/dL (ref 8.6–10.4)
Chloride: 103 mmol/L (ref 98–110)
Creat: 0.74 mg/dL (ref 0.50–0.99)
Glucose, Bld: 103 mg/dL — ABNORMAL HIGH (ref 65–99)
Potassium: 4.5 mmol/L (ref 3.5–5.3)
Sodium: 139 mmol/L (ref 135–146)

## 2020-08-07 MED ORDER — METOPROLOL SUCCINATE ER 25 MG PO TB24: 25.0000 mg | ORAL_TABLET | Freq: Every day | ORAL | 1 refills | Status: DC

## 2020-08-07 MED ORDER — ATORVASTATIN CALCIUM 20 MG PO TABS: 20.0000 mg | ORAL_TABLET | Freq: Every day | ORAL | 1 refills | Status: DC

## 2020-08-07 MED ORDER — LISINOPRIL 10 MG PO TABS: 30.0000 mg | ORAL_TABLET | Freq: Every day | ORAL | 1 refills | Status: DC

## 2020-08-07 NOTE — Patient Instructions (Addendum)
Try adding flonase 2 sprays each nostril once daily. Complete lab work prior to leaving.

## 2020-08-07 NOTE — Progress Notes (Signed)
Subjective:    Patient ID: Melissa Grimes, female    DOB: 08-02-1956, 64 y.o.   MRN: [id number]  HPI   Patient is a 64 yr old female who presents today for follow up.  HTN- on metoprolol and lisinopril.   BP Readings from Last 3 Encounters:  08/07/20 135/88  03/15/20 127/79  02/14/20 130/87    Hyperlipidemia- maintained on statin Lab Results  Component Value Date   CHOL 147 02/14/2020   HDL 56.50 02/14/2020   LDLCALC 71 02/14/2020   LDLDIRECT 102.0 08/06/2015   TRIG 94.0 02/14/2020   CHOLHDL 3 02/14/2020   Requesting STD screening.  C/o ongoing drip from the right nostril.  Has been using an otc   /o plantar fasciitis in her left heel. Doing exercises and icing.    Also c/o "weird bump" on her back.  "Like an ongoing pimple."    Review of Systems See HPI  Past Medical History:  Diagnosis Date  . Arthritis   . Complication of anesthesia    difficulty urinating after  . Hyperlipidemia   . Hypertension      Social History   Socioeconomic History  . Marital status: Married    Spouse name: Not on file  . Number of children: 2  . Years of education: Not on file  . Highest education level: Not on file  Occupational History  . Not on file  Tobacco Use  . Smoking status: Never Smoker  . Smokeless tobacco: Never Used  Vaping Use  . Vaping Use: Never used  Substance and Sexual Activity  . Alcohol use: Yes    Alcohol/week: 3.0 standard drinks    Types: 3 Glasses of wine per week  . Drug use: No  . Sexual activity: Yes  Other Topics Concern  . 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 Difficulty of Paying Living Expenses: Not on file  Food Insecurity:   . Worried About Charity fundraiser in the Last Year: Not on file  . Ran Out of  Food in the Last Year: Not on file  Transportation Needs:   . Lack of Transportation (Medical): Not on file  . Lack of Transportation (Non-Medical): Not on file  Physical Activity:   . Days of Exercise per Week: Not on file  . Minutes of Exercise per Session: Not on file  Stress:   . Feeling of Stress : Not on file  Social Connections:   . Frequency of Communication with Friends and Family: Not on file  . Frequency of Social Gatherings with Friends and Family: Not on file  . Attends Religious Services: Not on file  . Active Member of Clubs or Organizations: Not on file  . Attends Archivist Meetings: Not on file  . Marital Status: Not on file  Intimate Partner Violence:   . Fear of Current or Ex-Partner: Not on file  . Emotionally Abused: Not on file  . Physically Abused: Not on file  . Sexually Abused: Not on file    Past Surgical History:  Procedure Laterality Date  . BUNIONECTOMY    . CESAREAN SECTION     x 2  . HERNIA REPAIR Left 1977   inguinal  .  [place]  . TOTAL HIP ARTHROPLASTY Right 01/18/2013   Procedure: Right TOTAL HIP ARTHROPLASTY ANTERIOR APPROACH;  Surgeon: Mcarthur Rossetti, MD;  Location: [facility];  Service: Orthopedics;  Laterality: Right;  . TOTAL HIP ARTHROPLASTY Left 08/09/2013   Procedure: LEFT TOTAL HIP ARTHROPLASTY ANTERIOR APPROACH;  Surgeon: Mcarthur Rossetti, MD;  Location: [facility];  Service: Orthopedics;  Laterality: Left;    Family History  Problem Relation Age of Onset  . Arthritis Mother        osteoarthritis  . Hyperlipidemia Mother   . Heart disease Mother   . Hypertension Mother   . Hypothyroidism Mother   . Hyperlipidemia Father   . Heart disease Father   . Stroke Father   . Parkinson's disease Father   . Cancer Cousin        breast  . Cancer Maternal Grandfather        lung  . Obesity Sister        "overweight"  . Hypertension Sister   . Varicose Veins Child   . Asthma Child   . Migraines Child       Allergies  Allergen Reactions  . Achromycin [Tetracycline] Hives    Current Outpatient Medications on File Prior to Visit  Medication Sig Dispense Refill  . Ascorbic Acid (VITAMIN C) 500 MG CAPS Take by mouth.    Marland Kitchen aspirin EC 81 MG tablet Take 81 mg by mouth daily.    . Calcium Citrate-Vitamin D 500-400 MG-UNIT CHEW Chew 1 tablet by mouth 2 (two) times daily.     . methocarbamol (ROBAXIN) 500 MG tablet TAKE 1 TABLET BY MOUTH EVERY 6 HOURS AS NEEDED FOR MUSCLE SPASM (Patient not taking: Reported on 03/15/2020) 40 tablet 0  . Multiple Vitamin (MULTIVITAMIN) tablet Take 1 tablet by mouth daily.    . Multiple Vitamins-Minerals (PRESERVISION AREDS PO) Take by mouth.    . Omega-3 Fatty Acids (FISH OIL) 1000 MG CAPS Take 1 capsule by mouth daily.    Marland Kitchen VITAMIN D, CHOLECALCIFEROL, PO Take by mouth.    . [DISCONTINUED] lisinopril (ZESTRIL) 10 MG tablet TAKE 3 TABLETS BY MOUTH ONCE DAILY. APPOINTMENT REQUIRED FOR FUTURE REFILLS 90 tablet 0   No current facility-administered medications on file prior to visit.    BP 135/88   Pulse 60   Temp 98 F (36.7 C)   Ht 5\' 3"  (1.6 m)   Wt 146 lb (66.2 kg)   LMP 11/17/2010   SpO2 100%   BMI 25.86 kg/m       Objective:   Physical Exam Constitutional:      Appearance: She is well-developed.  Neck:     Thyroid: No thyromegaly.  Cardiovascular:     Rate and Rhythm: Normal rate and regular rhythm.     Heart sounds: Normal heart sounds. No murmur heard.   Pulmonary:     Effort: Pulmonary effort is normal. No respiratory distress.     Breath sounds: Normal breath sounds. No wheezing.  Musculoskeletal:     Cervical back: Neck supple.  Skin:    General: Skin is warm and dry.     Comments: Small sebaceous cyst noted on back between scapula  Neurological:     Mental Status: She is alert and oriented to person, place, and time.  Psychiatric:        Behavior: Behavior normal.        Thought Content: Thought content normal.        Judgment:  Judgment normal.           Assessment & Plan:  HTN- bp stable. Continue current meds. Obtain follow up bmet.  Hyperlipidemia- at goal, continue statin.  Sebaceous cyst- desires excision. Refer to dermatology.  Plantar fasciitis- offered NSAID- she declines has been using aleve prn with good response.  STD screening- per pt request. See orders.   Allergic rhinitis- continues otc antihistamine, recommended trial of flonase.  This visit occurred during the SARS-CoV-2 public health emergency.  Safety protocols were in place, including screening questions prior to the visit, additional usage of staff PPE, and extensive cleaning of exam room while observing appropriate contact time as indicated for disinfecting solutions.

## 2020-08-10 ENCOUNTER — Telehealth: Payer: Self-pay | Admitting: Family

## 2020-08-10 DIAGNOSIS — Z113 Encounter for screening for infections with a predominantly sexual mode of transmission: Secondary | ICD-10-CM

## 2020-08-10 LAB — RPR+HSVIGM+HBSAG+HSV2(IGG)+...
HIV Screen 4th Generation wRfx: NONREACTIVE
HSV 2 IgG, Type Spec: <0.91 index (ref 0.00–0.90)
HSVI/II Comb IgM: 1.08 Ratio — ABNORMAL HIGH (ref 0.00–0.90)
Hepatitis B Surface Ag: NEGATIVE
RPR Ser Ql: NONREACTIVE

## 2020-08-10 LAB — URINE CYTOLOGY ANCILLARY ONLY
Bacterial Vaginitis-Urine: NEGATIVE
Candida Urine: NEGATIVE
Chlamydia: NEGATIVE
Comment: NEGATIVE
Comment: NEGATIVE
Comment: NORMAL
Neisseria Gonorrhea: NEGATIVE
Trichomonas: NEGATIVE

## 2020-08-10 NOTE — Telephone Encounter (Signed)
Please contact pt and let her know that I reviewed her lab work.  STD testing is negative, except her herpes titer is equivocal. This means that the result is inconclusive. Sometimes we get this result if a patient has an early infection.  I would recommend that she repeat testing in 6 weeks.

## 2020-08-10 NOTE — Telephone Encounter (Signed)
Lvm for patient to call back about lab results

## 2020-08-13 NOTE — Telephone Encounter (Signed)
Results given to patient and schedule for repeat labs 09-25-20

## 2020-09-03 ENCOUNTER — Other Ambulatory Visit: Payer: Self-pay | Admitting: Family

## 2020-09-18 ENCOUNTER — Telehealth: Payer: Self-pay | Admitting: Family

## 2020-09-18 NOTE — Telephone Encounter (Signed)
Left detail message for patient to be aware test has been ordered, test order form faxed to [facility].

## 2020-09-18 NOTE — Telephone Encounter (Signed)
Patient is due for a follow up cologuard. Could you please notify pt and order?

## 2020-09-25 ENCOUNTER — Other Ambulatory Visit (INDEPENDENT_AMBULATORY_CARE_PROVIDER_SITE_OTHER): Payer: 59

## 2020-09-25 ENCOUNTER — Other Ambulatory Visit: Payer: Self-pay

## 2020-09-25 DIAGNOSIS — Z113 Encounter for screening for infections with a predominantly sexual mode of transmission: Secondary | ICD-10-CM | POA: Diagnosis not present

## 2020-09-26 LAB — HSV 2 ANTIBODY, IGG: HSV 2 Glycoprotein G Ab, IgG: <0.9 index

## 2020-09-26 LAB — HSV 1 ANTIBODY, IGG: HSV 1 Glycoprotein G Ab, IgG: 22.8 index — ABNORMAL HIGH

## 2020-09-28 LAB — HSV(HERPES SIMPLEX VRS) I + II AB-IGM: HSVI/II Comb IgM: 1.17 Ratio — ABNORMAL HIGH (ref 0.00–0.90)

## 2020-10-04 ENCOUNTER — Telehealth: Payer: Self-pay

## 2020-10-04 ENCOUNTER — Other Ambulatory Visit: Payer: Self-pay | Admitting: Family

## 2020-10-04 NOTE — Telephone Encounter (Signed)
[facility] called about pts prescription for Lisinopril 10 MG. Pharm test asked to for a call back.

## 2020-10-05 NOTE — Telephone Encounter (Signed)
Called pharmacy, they wanted to make sure the rx refill was with them or mail order, pharmacy advised rx with refills was sent to them in Sept.

## 2020-10-08 ENCOUNTER — Telehealth: Payer: Self-pay | Admitting: Family

## 2020-10-08 MED ORDER — LISINOPRIL 10 MG PO TABS
30.0000 mg | ORAL_TABLET | Freq: Every day | ORAL | 1 refills | Status: DC
Start: 2020-10-08 — End: 2020-10-31

## 2020-10-08 NOTE — Telephone Encounter (Signed)
rx was sent to requested pharmacy

## 2020-10-08 NOTE — Telephone Encounter (Signed)
Medication: lisinopril (ZESTRIL) 10 MG tablet [[id number]  Has the patient contacted their pharmacy? No. (If no, request that the patient contact the pharmacy for the refill.) (If yes, when and what did the pharmacy advise?)  Preferred Pharmacy (with phone number or street name):   [facility] [address] [place], [place] - [address]  [address], [place] [address]  Phone:  [phone] Fax:  [phone]  DEA #:  -- Agent: Please be advised that RX refills may take up to 3 business days. We ask that you follow-up with your pharmacy.

## 2020-10-25 DIAGNOSIS — M21611 Bunion of right foot: Secondary | ICD-10-CM | POA: Insufficient documentation

## 2020-10-31 ENCOUNTER — Telehealth: Payer: Self-pay | Admitting: Family

## 2020-10-31 ENCOUNTER — Other Ambulatory Visit: Payer: Self-pay

## 2020-10-31 MED ORDER — ATORVASTATIN CALCIUM 20 MG PO TABS
20.0000 mg | ORAL_TABLET | Freq: Every day | ORAL | 0 refills | Status: DC
Start: 2020-10-31 — End: 2020-10-31

## 2020-10-31 MED ORDER — METOPROLOL SUCCINATE ER 25 MG PO TB24: 25.0000 mg | ORAL_TABLET | Freq: Every day | ORAL | 0 refills | Status: DC

## 2020-10-31 MED ORDER — ATORVASTATIN CALCIUM 20 MG PO TABS: 20.0000 mg | ORAL_TABLET | Freq: Every day | ORAL | 0 refills | Status: DC

## 2020-10-31 MED ORDER — LISINOPRIL 10 MG PO TABS
30.0000 mg | ORAL_TABLET | Freq: Every day | ORAL | 0 refills | Status: DC
Start: 2020-10-31 — End: 2020-10-31

## 2020-10-31 MED ORDER — LISINOPRIL 10 MG PO TABS
30.0000 mg | ORAL_TABLET | Freq: Every day | ORAL | 0 refills | Status: DC
Start: 2020-10-31 — End: 2020-11-01

## 2020-10-31 MED ORDER — METOPROLOL SUCCINATE ER 25 MG PO TB24
25.0000 mg | ORAL_TABLET | Freq: Every day | ORAL | 0 refills | Status: DC
Start: 2020-10-31 — End: 2020-10-31

## 2020-10-31 NOTE — Telephone Encounter (Signed)
Patient states she would like another prescription sent in to pharmacy for lisinopril (ZESTRIL) 10 MG tablet [[id number]]   Patient states she takes 3 pills a day and only got a month supply, patient is requesting a 90 day supply of medication   Please Advise

## 2020-11-01 MED ORDER — LISINOPRIL 10 MG PO TABS: 30.0000 mg | ORAL_TABLET | Freq: Every day | ORAL | 1 refills | Status: DC

## 2020-11-01 NOTE — Telephone Encounter (Signed)
Rx sent 

## 2020-11-02 ENCOUNTER — Telehealth: Payer: Self-pay | Admitting: Family

## 2020-11-02 MED ORDER — LISINOPRIL 10 MG PO TABS: 30.0000 mg | ORAL_TABLET | Freq: Every day | ORAL | 1 refills | Status: DC

## 2020-11-02 MED ORDER — METOPROLOL SUCCINATE ER 25 MG PO TB24: 25.0000 mg | ORAL_TABLET | Freq: Every day | ORAL | 0 refills | Status: DC

## 2020-11-02 MED ORDER — ATORVASTATIN CALCIUM 20 MG PO TABS: 20.0000 mg | ORAL_TABLET | Freq: Every day | ORAL | 0 refills | Status: DC

## 2020-11-02 NOTE — Telephone Encounter (Signed)
Patient state is requesting a 90day supply.    Medication:atorvastatin (LIPITOR) 20 MG tablet [[id number]  metoprolol succinate (TOPROL-XL) 25 MG 24 hr tablet  lisinopril (ZESTRIL) 10 MG tablet   Has the patient contacted their pharmacy? No. (If no, request that the patient contact the pharmacy for the refill.) (If yes, when and what did the pharmacy advise?)  Preferred Pharmacy (with phone number or street name):  [facility], [place], [address] Phone:  [phone]  Fax:  [phone]       Agent: Please be advised that RX refills may take up to 3 business days. We ask that you follow-up with your pharmacy.

## 2020-11-02 NOTE — Telephone Encounter (Signed)
Rx sent 

## 2020-11-05 ENCOUNTER — Other Ambulatory Visit: Payer: Self-pay

## 2020-11-05 ENCOUNTER — Telehealth (INDEPENDENT_AMBULATORY_CARE_PROVIDER_SITE_OTHER): Payer: 59 | Admitting: Medical

## 2020-11-05 VITALS — Temp 97.4°F

## 2020-11-05 DIAGNOSIS — R059 Cough, unspecified: Secondary | ICD-10-CM

## 2020-11-05 DIAGNOSIS — J040 Acute laryngitis: Secondary | ICD-10-CM

## 2020-11-05 MED ORDER — LISINOPRIL 10 MG PO TABS: 30.0000 mg | ORAL_TABLET | Freq: Every day | ORAL | 0 refills | Status: DC

## 2020-11-05 MED ORDER — FLUTICASONE PROPIONATE 50 MCG/ACT NA SUSP: 2.0000 | Freq: Every day | NASAL | 1 refills | Status: DC

## 2020-11-05 MED ORDER — BENZONATATE 100 MG PO CAPS: 100.0000 mg | ORAL_CAPSULE | Freq: Three times a day (TID) | ORAL | 0 refills | Status: DC | PRN

## 2020-11-05 NOTE — Patient Instructions (Signed)
Recent 1 day of very hoarse voice with occasional dry cough.  You do feel postnasal drainage presently and question whether or not may early allergic rhinitis type presentation.  Recommend that you rest her voice as much as possible, hydrate well and use throat lozenge.  Did send in benzonatate to help with your cough.  Also sent in Flonase nasal spray in the event of nasal congestion.  You present with 1 day of symptoms.  We are approaching Christmas holiday weekend and you might have early presentation of Covid.  I am happy to hear that you have been vaccinated and boosted.  Oftentimes with new variant symptoms so far appear to be minimal.  Would recommend doing binax you have at home today and then going to pharmacy to have send out PCR testing.  I think this would be helpful particularly since you are planning on potential upcoming trip.  Notify me via MyChart/call if binax is  positive or if PCR is positive.  Follow-up 7 to 10 days or as needed.

## 2020-11-05 NOTE — Progress Notes (Signed)
   Subjective:    Patient ID: Melissa Grimes, female    DOB: 10/23/1956, 64 y.o.   MRN: [id number]  HPI  Virtual Visit via Video Note  I connected with ESTELLE GREENLEAF on 11/05/20 at  3:20 PM EST by a video enabled telemedicine application and verified that I am speaking with the correct person using two identifiers.  Location: Patient: home Provider: home   I discussed the limitations of evaluation and management by telemedicine and the availability of in person appointments. The patient expressed understanding and agreed to proceed.  History of Present Illness: Pt states just yesterday noticed mild hoarse voice. Gradually getting worse.  Pt feels some pnd. She does not have sore throat.  She did talk a lot at church and  attended fellowship hour. Also she went visiting.  No fever or sweats. Some rare dry cough. Pt feels mild cold but nothing out of the ordinary.  No change in taste, no smell change and no fatigue.   No sneezing. No nasal congestion   Observations/Objective: General-no acute distress, pleasant, oriented. Very raspy hoarse voice. Lungs- on inspection lungs appear unlabored. Neck- no tracheal deviation or jvd on inspection. Neuro- gross motor function appears intact.    Assessment and Plan: Recent 1 day of very hoarse voice with occasional dry cough.  You do feel postnasal drainage presently and question whether or not may early allergic rhinitis type presentation.  Recommend that you rest her voice as much as possible, hydrate well and use throat lozenge.  Did send in benzonatate to help with your cough.  Also sent in Flonase nasal spray in the event of nasal congestion.  You present with 1 day of symptoms.  We are approaching Christmas holiday weekend and you might have early presentation of Covid.  I am happy to hear that you have been vaccinated and boosted.  Oftentimes with new variant symptoms so far appear to be minimal.  Would recommend doing binax  you have at home today and then going to pharmacy to have send out PCR testing.  I think this would be helpful particularly since you are planning on potential upcoming trip.  Notify me via MyChart/call if binax is  positive or if PCR is positive.  Follow-up 7 to 10 days or as needed.  Mackie Pai, PA-C  Follow Up Instructions:    I discussed the assessment and treatment plan with the patient. The patient was provided an opportunity to ask questions and all were answered. The patient agreed with the plan and demonstrated an understanding of the instructions.   The patient was advised to call back or seek an in-person evaluation if the symptoms worsen or if the condition fails to improve as anticipated.  Time spent with patient today was 20  minutes which consisted of chart revdiew, discussing diagnosis, work up treatment and documentation.   Mackie Pai, PA-C   Review of Systems     Objective:   Physical Exam        Assessment & Plan:

## 2020-11-09 ENCOUNTER — Encounter: Payer: Self-pay | Admitting: Medical

## 2020-11-09 LAB — COLOGUARD: COLOGUARD: NEGATIVE

## 2020-11-19 ENCOUNTER — Telehealth: Payer: Self-pay | Admitting: Family

## 2020-11-19 NOTE — Telephone Encounter (Signed)
See mychart.  

## 2020-11-23 DIAGNOSIS — M21611 Bunion of right foot: Secondary | ICD-10-CM | POA: Diagnosis not present

## 2020-11-23 DIAGNOSIS — M205X1 Other deformities of toe(s) (acquired), right foot: Secondary | ICD-10-CM | POA: Diagnosis not present

## 2020-11-28 DIAGNOSIS — M205X1 Other deformities of toe(s) (acquired), right foot: Secondary | ICD-10-CM | POA: Diagnosis not present

## 2020-11-28 DIAGNOSIS — M21611 Bunion of right foot: Secondary | ICD-10-CM | POA: Diagnosis not present

## 2020-11-28 DIAGNOSIS — Z89421 Acquired absence of other right toe(s): Secondary | ICD-10-CM | POA: Diagnosis not present

## 2020-11-28 DIAGNOSIS — M799 Soft tissue disorder, unspecified: Secondary | ICD-10-CM | POA: Diagnosis not present

## 2020-12-05 DIAGNOSIS — Z89421 Acquired absence of other right toe(s): Secondary | ICD-10-CM | POA: Diagnosis not present

## 2020-12-10 ENCOUNTER — Telehealth: Payer: Self-pay | Admitting: Family

## 2020-12-10 ENCOUNTER — Other Ambulatory Visit: Payer: Self-pay | Admitting: Family

## 2020-12-10 NOTE — Telephone Encounter (Signed)
-

## 2020-12-10 NOTE — Telephone Encounter (Signed)
Patient is requesting a 90DAY SUPPLY of 270 pills of : lisinopril (ZESTRIL) 10 MG tablet [[id number]]    Patient states she takes 3 pills a day.  Patient would like to use   [facility], [place], [address] Phone:  [phone]  Fax:  [phone]

## 2020-12-11 ENCOUNTER — Other Ambulatory Visit: Payer: Self-pay | Admitting: Family

## 2020-12-11 NOTE — Telephone Encounter (Signed)
Pt's insurance no longer covers lisinopril 10mg - 3 tablets daily. Requesting to switch to lisinopril 30mg  tablet. Please advise.

## 2020-12-13 ENCOUNTER — Other Ambulatory Visit: Payer: Self-pay | Admitting: Family

## 2020-12-13 MED ORDER — LISINOPRIL 20 MG PO TABS: 30.0000 mg | ORAL_TABLET | Freq: Every day | ORAL | 0 refills | Status: DC

## 2020-12-13 NOTE — Telephone Encounter (Signed)
Rx changed to 20mg  tabs 1.5 tabs once daily.  It does not come in a 30 mg tab.

## 2020-12-13 NOTE — Telephone Encounter (Signed)
Patient would like a 90 days supply. Patient takes 3 10mg  a day quantity should be 270 . Pharmacy states they dont have rx.

## 2020-12-13 NOTE — Telephone Encounter (Signed)
Please see message on 12/11/20.   Pt's insurance no longer covers lisinopril 10mg - 3 tablets daily. Requesting to switch to lisinopril 30mg  tablet. Please advise.

## 2021-01-15 ENCOUNTER — Ambulatory Visit: Payer: 59 | Admitting: Dermatology

## 2021-02-05 ENCOUNTER — Other Ambulatory Visit (HOSPITAL_BASED_OUTPATIENT_CLINIC_OR_DEPARTMENT_OTHER): Payer: Self-pay | Admitting: Family

## 2021-02-05 ENCOUNTER — Other Ambulatory Visit: Payer: Self-pay

## 2021-02-05 ENCOUNTER — Encounter: Payer: Self-pay | Admitting: Family

## 2021-02-05 ENCOUNTER — Other Ambulatory Visit (HOSPITAL_COMMUNITY)
Admission: RE | Admit: 2021-02-05 | Discharge: 2021-02-05 | Disposition: A | Discharge status: Home / Self Care | Payer: Medicare Other | Source: Ambulatory Visit | Attending: Family | Admitting: Family

## 2021-02-05 ENCOUNTER — Ambulatory Visit (INDEPENDENT_AMBULATORY_CARE_PROVIDER_SITE_OTHER): Payer: Medicare Other | Admitting: Family

## 2021-02-05 VITALS — BP 139/82 | HR 50 | Temp 97.9°F | Resp 16 | Ht 63.0 in | Wt 144.2 lb

## 2021-02-05 DIAGNOSIS — Z0000 Encounter for general adult medical examination without abnormal findings: Secondary | ICD-10-CM

## 2021-02-05 DIAGNOSIS — I10 Essential (primary) hypertension: Secondary | ICD-10-CM

## 2021-02-05 DIAGNOSIS — Z113 Encounter for screening for infections with a predominantly sexual mode of transmission: Secondary | ICD-10-CM | POA: Diagnosis present

## 2021-02-05 DIAGNOSIS — E785 Hyperlipidemia, unspecified: Secondary | ICD-10-CM | POA: Diagnosis not present

## 2021-02-05 DIAGNOSIS — Z1231 Encounter for screening mammogram for malignant neoplasm of breast: Secondary | ICD-10-CM

## 2021-02-05 LAB — LIPID PANEL
Cholesterol: 168 mg/dL (ref 0–200)
HDL: 63.7 mg/dL (ref >39.00)
LDL Cholesterol: 85 mg/dL (ref 0–99)
NonHDL: 104.29
Total CHOL/HDL Ratio: 3
Triglycerides: 98 mg/dL (ref 0.0–149.0)
VLDL: 19.6 mg/dL (ref 0.0–40.0)

## 2021-02-05 LAB — COMPREHENSIVE METABOLIC PANEL
ALT: 20 U/L (ref 0–35)
AST: 21 U/L (ref 0–37)
Albumin: 4.8 g/dL (ref 3.5–5.2)
Alkaline Phosphatase: 75 U/L (ref 39–117)
BUN: 20 mg/dL (ref 6–23)
CO2: 30 mEq/L (ref 19–32)
Calcium: 10 mg/dL (ref 8.4–10.5)
Chloride: 103 mEq/L (ref 96–112)
Creatinine, Ser: 0.65 mg/dL (ref 0.40–1.20)
GFR: 91.85 mL/min (ref >60.00)
Glucose, Bld: 98 mg/dL (ref 70–99)
Potassium: 4.1 mEq/L (ref 3.5–5.1)
Sodium: 140 mEq/L (ref 135–145)
Total Bilirubin: 0.6 mg/dL (ref 0.2–1.2)
Total Protein: 7 g/dL (ref 6.0–8.3)

## 2021-02-05 NOTE — Progress Notes (Signed)
Subjective:    Patient ID: Melissa Grimes, female    DOB: 05/19/1956, 65 y.o.   MRN: [id number]  HPI  Patient is a 65 yr old female who presents today for cpx.  Immunizations: Had shingrix series. Had 3 covid vaccines, tdap 2014, pneumovax, flu shot up to date Diet: healthy Exercise:  Goes to the gym twice a week Colonoscopy: cologuard 11/02/20  Dexa:  2020- normal Pap Smear: followed by GYN, has hx of abnormal pap.  Last pap 03/15/20 Mammogram: 04/26/20 Vision: up to date Dental: due- will scheduled.   HTN- maintained on toprol xl 25mg , lisinopril 30mg .  BP Readings from Last 3 Encounters:  02/05/21 139/82  08/07/20 135/88  03/15/20 127/79   Hyperlipidemia- maintained on atorvastatin 20mg .  Lab Results  Component Value Date   CHOL 147 02/14/2020   HDL 56.50 02/14/2020   LDLCALC 71 02/14/2020   LDLDIRECT 102.0 08/06/2015   TRIG 94.0 02/14/2020   CHOLHDL 3 02/14/2020     Review of Systems  Constitutional: Negative for unexpected weight change.  HENT: Negative for hearing loss and rhinorrhea.   Eyes: Negative for visual disturbance.  Respiratory: Negative for cough and shortness of breath.   Cardiovascular: Negative for chest pain.  Gastrointestinal: Negative for blood in stool, constipation, nausea and vomiting.  Genitourinary: Negative for dysuria, frequency and hematuria.  Musculoskeletal: Negative for arthralgias.  Skin: Negative for rash.  Neurological: Negative for headaches.  Hematological: Negative for adenopathy.  Psychiatric/Behavioral:       Denies depression or anxiety       Past Medical History:  Diagnosis Date  . Arthritis   . Complication of anesthesia    difficulty urinating after  . Hyperlipidemia   . Hypertension      Social History   Socioeconomic History  . Marital status: Married    Spouse name: Not on file  . Number of children: 2  . Years of education: Not on file  . Highest education level: Not on file  Occupational  History  . Not on file  Tobacco Use  . Smoking status: Never Smoker  . Smokeless tobacco: Never Used  Vaping Use  . Vaping Use: Never used  Substance and Sexual Activity  . Alcohol use: Yes    Alcohol/week: 3.0 standard drinks    Types: 3 Glasses of wine per week  . Drug use: No  . Sexual activity: Yes  Other Topics Concern  . Not on file  Social History Narrative   Corporate treasurer   Completed 2 yrs college   Married- from [place]   2 children- grown both in [place]   Enjoys Adult nurse ed material   1 Neurosurgeon   Social Determinants of Radio broadcast assistant Strain: Not on [employer] Insecurity: Not on file  Transportation Needs: Not on file  Physical Activity: Not on file  Stress: Not on file  Social Connections: Not on file  Intimate Partner Violence: Not on file    Past Surgical History:  Procedure Laterality Date  . BUNIONECTOMY    . CESAREAN SECTION     x 2  . HERNIA REPAIR Left 1977   inguinal  . [place]  . 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 TOTAL HIP ARTHROPLASTY Left 08/09/2013   Procedure: LEFT TOTAL HIP ARTHROPLASTY ANTERIOR APPROACH;  Surgeon: Mcarthur Rossetti, MD;  Location: [facility];  Service: Orthopedics;  Laterality: Left;    Family History  Problem Relation Age of Onset  . Arthritis Mother        osteoarthritis  . Hyperlipidemia Mother   . Heart disease Mother   . Hypertension Mother   . Hypothyroidism Mother   . Hyperlipidemia Father   . Heart disease Father   . Stroke Father   . Parkinson's disease Father   . Cancer Cousin        breast  . Cancer Maternal Grandfather        lung  . Obesity Sister        "overweight"  . Hypertension Sister   . Varicose Veins Child   . Asthma Child   . Migraines Child     Allergies  Allergen Reactions  .  Achromycin [Tetracycline] Hives    Current Outpatient Medications on File Prior to Visit  Medication Sig Dispense Refill  . Ascorbic Acid (VITAMIN C) 500 MG CAPS Take by mouth.    Marland Kitchen aspirin EC 81 MG tablet Take 81 mg by mouth daily.    Marland Kitchen atorvastatin (LIPITOR) 20 MG tablet Take 1 tablet (20 mg total) by mouth daily. 90 tablet 0  . Calcium Citrate-Vitamin D 500-400 MG-UNIT CHEW Chew 1 tablet by mouth 2 (two) times daily.     . fluticasone (FLONASE) 50 MCG/ACT nasal spray Place 2 sprays into both nostrils daily. 16 g 1  . lisinopril (ZESTRIL) 30 MG tablet TAKE 1 TABLET BY MOUTH ONCE DAILY 90 tablet 0  . methocarbamol (ROBAXIN) 500 MG tablet TAKE 1 TABLET BY MOUTH EVERY 6 HOURS AS NEEDED FOR MUSCLE SPASM 40 tablet 0  . metoprolol succinate (TOPROL-XL) 25 MG 24 hr tablet Take 1 tablet (25 mg total) by mouth daily. 90 tablet 0  . Multiple Vitamin (MULTIVITAMIN) tablet Take 1 tablet by mouth daily.    . Multiple Vitamins-Minerals (PRESERVISION AREDS PO) Take by mouth.    . Omega-3 Fatty Acids (FISH OIL) 1000 MG CAPS Take 1 capsule by mouth daily.    Marland Kitchen VITAMIN D, CHOLECALCIFEROL, PO Take by mouth.     No current facility-administered medications on file prior to visit.    BP 139/82 (BP Location: Left Arm, Patient Position: Sitting, Cuff Size: Small)   Pulse (!) 50   Temp 97.9 F (36.6 C) (Oral)   Resp 16   Ht 5\' 3"  (1.6 m)   Wt 144 lb 3.2 oz (65.4 kg)   LMP 11/17/2010   SpO2 100%   BMI 25.54 kg/m    Objective:   Physical Exam  Physical Exam  Constitutional: She is oriented to person, place, and time. She appears well-developed and well-nourished. No distress.  HENT:  Head: Normocephalic and atraumatic.  Right Ear: Tympanic membrane and ear canal normal.  Left Ear: Tympanic membrane and ear canal normal.  Mouth/Throat: Not examined- pt wearing mask Eyes: Pupils are equal, round, and reactive to light. No scleral icterus.  Neck: Normal range of motion. No thyromegaly present.   Cardiovascular: Normal rate and regular rhythm.   No murmur heard. Pulmonary/Chest: Effort normal and breath sounds normal. No respiratory distress. He has no wheezes. She has no rales. She exhibits no tenderness.  Abdominal: Soft. Bowel sounds are normal. She exhibits no distension and no mass. There is no tenderness. There is no rebound and no guarding.  Musculoskeletal: She exhibits no edema.  Lymphadenopathy:    She has no cervical adenopathy.  Neurological: She is alert and oriented to person, place, and time. She has normal patellar reflexes. She exhibits normal muscle tone. Coordination normal.  Skin: Skin is warm and dry.  Psychiatric: She has a normal mood and affect. Her behavior is normal. Judgment and thought content normal.  Breasts: Examined lying Right: Without masses, retractions, discharge or axillary adenopathy.  Left: Without masses, retractions, discharge or axillary adenopathy.  Pelvic: deferred         Assessment & Plan:   Preventative care- discussed healthy diet, exercise.  Immunizations reviewed and up to date.  Mammo, dexa, cologuard up to date.  Advised pt to schedule a follow up appointment with GYN.    HTN- bp is stable. Continue lisinopril 30mg  and toprol xl 25 mg.  Hyperlipidemia- tolerating atorvastatin 20 mg. Continue same. Obtain follow up lipid panel.  STD screening- pt is requesting STD screening. Will order GC/Chlamydia/Trichomonas/HIV/RPR/HSV2/Hep B  This visit occurred during the SARS-CoV-2 public health emergency.  Safety protocols were in place, including screening questions prior to the visit, additional usage of staff PPE, and extensive cleaning of exam room while observing appropriate contact time as indicated for disinfecting solutions.        Assessment & Plan:

## 2021-02-05 NOTE — Patient Instructions (Addendum)
Please schedule a follow up appointment with GYN.  Complete lab work prior to leaving. Continue your work on healthy diet and regular exercise.

## 2021-02-06 LAB — URINE CYTOLOGY ANCILLARY ONLY
Chlamydia: NEGATIVE
Comment: NEGATIVE
Comment: NEGATIVE
Comment: NORMAL
Neisseria Gonorrhea: NEGATIVE
Trichomonas: NEGATIVE

## 2021-02-08 LAB — RPR+HSVIGM+HBSAG+HSV2(IGG)+...
HIV Screen 4th Generation wRfx: NONREACTIVE
HSV 2 IgG, Type Spec: <0.91 index (ref 0.00–0.90)
HSVI/II Comb IgM: 1.09 Ratio — ABNORMAL HIGH (ref 0.00–0.90)
Hepatitis B Surface Ag: NEGATIVE
RPR Ser Ql: NONREACTIVE

## 2021-03-11 ENCOUNTER — Other Ambulatory Visit: Payer: Self-pay | Admitting: Family

## 2021-03-11 NOTE — Telephone Encounter (Signed)
Medication was sent

## 2021-03-12 ENCOUNTER — Other Ambulatory Visit: Payer: Self-pay | Admitting: *Deleted

## 2021-03-12 MED ORDER — LISINOPRIL 30 MG PO TABS: 30.0000 mg | ORAL_TABLET | Freq: Every day | ORAL | 0 refills | Status: DC

## 2021-03-15 ENCOUNTER — Ambulatory Visit (INDEPENDENT_AMBULATORY_CARE_PROVIDER_SITE_OTHER): Payer: Medicare Other | Admitting: Family Medicine

## 2021-03-15 ENCOUNTER — Encounter: Payer: Self-pay | Admitting: Family Medicine

## 2021-03-15 ENCOUNTER — Other Ambulatory Visit: Payer: Self-pay

## 2021-03-15 ENCOUNTER — Other Ambulatory Visit (HOSPITAL_COMMUNITY)
Admission: RE | Admit: 2021-03-15 | Discharge: 2021-03-15 | Disposition: A | Discharge status: Home / Self Care | Payer: Medicare Other | Source: Ambulatory Visit | Attending: Family Medicine | Admitting: Family Medicine

## 2021-03-15 VITALS — BP 122/75 | HR 60 | Wt 140.0 lb

## 2021-03-15 DIAGNOSIS — Z01419 Encounter for gynecological examination (general) (routine) without abnormal findings: Secondary | ICD-10-CM | POA: Insufficient documentation

## 2021-03-15 DIAGNOSIS — Z1151 Encounter for screening for human papillomavirus (HPV): Secondary | ICD-10-CM | POA: Diagnosis not present

## 2021-03-15 DIAGNOSIS — R319 Hematuria, unspecified: Secondary | ICD-10-CM

## 2021-03-15 DIAGNOSIS — R87810 Cervical high risk human papillomavirus (HPV) DNA test positive: Secondary | ICD-10-CM | POA: Diagnosis not present

## 2021-03-15 NOTE — Progress Notes (Signed)
GYNECOLOGY ANNUAL PREVENTATIVE CARE ENCOUNTER NOTE  Subjective:   Melissa Grimes is a 65 y.o. G19P2002 female here for a routine annual gynecologic exam.  Current complaints: a couple episodes of bleeding, which she thinks is coming from her urine. Both happened at night when she urinated. No pain, just episodes of frequency.   Denies abnormal vaginal bleeding, discharge, pelvic pain, problems with intercourse or other gynecologic concerns.    Gynecologic History Patient's last menstrual period was 11/17/2010. Patient is sexually active  Contraception: post menopausal status Last Pap: 2021. Results were: normal cytology, + HPV. Had LSIL before this Last mammogram: 2021. Results were: normal  Obstetric History OB History  Gravida Para Term Preterm AB Living  2 2 2     2   SAB IAB Ectopic Multiple Live Births          2    # Outcome Date GA Lbr Len/2nd Weight Sex Delivery Anes PTL Lv  2 Term 1985 [redacted]w[redacted]d   F CS-LTranv Spinal N LIV  1 Term 1982 [redacted]w[redacted]d   M CS-LTranv EPI N LIV    Past Medical History:  Diagnosis Date  . Arthritis   . Complication of anesthesia    difficulty urinating after  . Hyperlipidemia   . Hypertension     Past Surgical History:  Procedure Laterality Date  . BUNIONECTOMY    . CESAREAN SECTION     x 2  . HERNIA REPAIR Left 1977   inguinal  . [place]  . 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 TOTAL HIP ARTHROPLASTY Left 08/09/2013   Procedure: LEFT TOTAL HIP ARTHROPLASTY ANTERIOR APPROACH;  Surgeon: Mcarthur Rossetti, MD;  Location: [facility];  Service: Orthopedics;  Laterality: Left;    Current Outpatient Medications on File Prior to Visit  Medication Sig Dispense Refill  . Ascorbic Acid (VITAMIN C) 500 MG CAPS Take by mouth.    Marland Kitchen aspirin EC 81 MG tablet Take 81 mg by mouth  daily.    Marland Kitchen atorvastatin (LIPITOR) 20 MG tablet TAKE 1 TABLET BY MOUTH  DAILY 90 tablet 3  . Calcium Citrate-Vitamin D 500-400 MG-UNIT CHEW Chew 1 tablet by mouth 2 (two) times daily.     Marland Kitchen lisinopril (ZESTRIL) 30 MG tablet Take 1 tablet (30 mg total) by mouth daily. 90 tablet 0  . metoprolol succinate (TOPROL-XL) 25 MG 24 hr tablet TAKE 1 TABLET BY MOUTH  DAILY 90 tablet 3  . Multiple Vitamin (MULTIVITAMIN) tablet Take 1 tablet by mouth daily.    . Multiple Vitamins-Minerals (PRESERVISION AREDS PO) Take by mouth.    . Omega-3 Fatty Acids (FISH OIL) 1000 MG CAPS Take 1 capsule by mouth daily.    Marland Kitchen VITAMIN D, CHOLECALCIFEROL, PO Take by mouth.    . fluticasone (FLONASE) 50 MCG/ACT nasal spray Place 2 sprays into both nostrils daily. (Patient not taking: Reported on 03/15/2021) 16 g 1  . methocarbamol (ROBAXIN) 500 MG tablet TAKE 1 TABLET BY MOUTH EVERY 6 HOURS AS NEEDED FOR MUSCLE SPASM (Patient not taking: Reported on 03/15/2021) 40 tablet 0   No current facility-administered medications on file prior to visit.    Allergies  Allergen Reactions  . Achromycin [Tetracycline] Hives    Social History   Socioeconomic History  . Marital status: Married    Spouse name: Not on  file  . Number of children: 2  . Years of education: Not on file  . Highest education level: Not on file  Occupational History  . Not on file  Tobacco Use  . Smoking status: Never Smoker  . Smokeless tobacco: Never Used  Vaping Use  . Vaping Use: Never used  Substance and Sexual Activity  . Alcohol use: Yes    Alcohol/week: 3.0 standard drinks    Types: 3 Glasses of wine per week  . Drug use: No  . Sexual activity: Yes  Other Topics Concern  . Not on file  Social History Narrative   Corporate treasurer   Completed 2 yrs college   Married- from [place]   2 children- grown both in [place]   Enjoys Adult nurse ed material   1 Neurosurgeon   Social Determinants of Radio broadcast assistant Strain: Not on  [employer] Insecurity: Not on file  Transportation Needs: Not on file  Physical Activity: Not on file  Stress: Not on file  Social Connections: Not on file  Intimate Partner Violence: Not on file    Family History  Problem Relation Age of Onset  . Arthritis Mother        osteoarthritis  . Hyperlipidemia Mother   . Heart disease Mother   . Hypertension Mother   . Hypothyroidism Mother   . Hyperlipidemia Father   . Heart disease Father   . Stroke Father   . Parkinson's disease Father   . Cancer Cousin        breast  . Cancer Maternal Grandfather        lung  . Obesity Sister        "overweight"  . Hypertension Sister   . Varicose Veins Child   . Asthma Child   . Migraines Child     The following portions of the patient's history were reviewed and updated as appropriate: allergies, current medications, past family history, past medical history, past social history, past surgical history and problem list.  Review of Systems Pertinent items are noted in HPI.   Objective:  BP 122/75   Pulse 60   Wt 140 lb (63.5 kg)   LMP 11/17/2010   BMI 24.80 kg/m  Wt Readings from Last 3 Encounters:  03/15/21 140 lb (63.5 kg)  02/05/21 144 lb 3.2 oz (65.4 kg)  08/07/20 146 lb (66.2 kg)     Chaperone present during exam  CONSTITUTIONAL: Well-developed, well-nourished female in no acute distress.  HENT:  Normocephalic, atraumatic, External right and left ear normal. Oropharynx is clear and moist EYES: Conjunctivae and EOM are normal. Pupils are equal, round, and reactive to light. No scleral icterus.  NECK: Normal range of motion, supple, no masses.  Normal thyroid.   CARDIOVASCULAR: Normal heart rate noted, regular rhythm RESPIRATORY: Clear to auscultation bilaterally. Effort and breath sounds normal, no problems with respiration noted. BREASTS: Symmetric in size. No masses, skin changes, nipple drainage, or lymphadenopathy. ABDOMEN: Soft, normal bowel sounds, no distention  noted.  No tenderness, rebound or guarding.  PELVIC: Normal appearing external genitalia; normal appearing vaginal mucosa and cervix.  No abnormal discharge noted.  Normal uterine size, no other palpable masses, no uterine or adnexal tenderness. Did have some bleeding from cervix with PAP. MUSCULOSKELETAL: Normal range of motion. No tenderness.  No cyanosis, clubbing, or edema.  2+ distal pulses. SKIN: Skin is warm and dry. No rash noted. Not diaphoretic. No erythema. No pallor. NEUROLOGIC: Alert and oriented to person,  place, and time. Normal reflexes, muscle tone coordination. No cranial nerve deficit noted. PSYCHIATRIC: Normal mood and affect. Normal behavior. Normal judgment and thought content.  Assessment:  Annual gynecologic examination with pap smear   Plan:  1. Well Woman Exam Will follow up results of pap smear and manage accordingly. Mammogram scheduled STD testing discussed. Patient requested vaginal testing - Cytology - PAP( [facility])  2. Hematuria, unspecified type + for hematuria. Check culture. - Urine Culture   Routine preventative health maintenance measures emphasized. Please refer to After Visit Summary for other counseling recommendations.    Loma Boston, [place] for [employer]

## 2021-03-18 LAB — CYTOLOGY - PAP
Chlamydia: NEGATIVE
Comment: NEGATIVE
Comment: NEGATIVE
Comment: NEGATIVE
Comment: NORMAL
Diagnosis: NEGATIVE
High risk HPV: NEGATIVE
Neisseria Gonorrhea: NEGATIVE
Trichomonas: NEGATIVE

## 2021-03-18 LAB — URINE CULTURE: Organism ID, Bacteria: NO GROWTH

## 2021-03-20 ENCOUNTER — Telehealth: Payer: Self-pay | Admitting: Family

## 2021-03-20 NOTE — Telephone Encounter (Signed)
Patient would like medication refill sent to a different pharmacy    Medication:atorvastatin (LIPITOR) 20 MG tablet [[id number]]   metoprolol succinate (TOPROL-XL) 25 MG 24 hr tablet [[id number]]   Has the patient contacted their pharmacy? No. (If no, request that the patient contact the pharmacy for the refill.) (If yes, when and what did the pharmacy advise?)  Preferred Pharmacy (with phone number or street name):  [id number] - [address] [place], [place] - [address] Phone:  [phone]  Fax:  [phone]       Agent: Please be advised that RX refills may take up to 3 business days. We ask that you follow-up with your pharmacy.

## 2021-03-21 ENCOUNTER — Other Ambulatory Visit: Payer: Self-pay

## 2021-03-21 MED ORDER — ATORVASTATIN CALCIUM 20 MG PO TABS: 1.0000 | ORAL_TABLET | Freq: Every day | ORAL | 0 refills | Status: DC

## 2021-03-21 MED ORDER — METOPROLOL SUCCINATE ER 25 MG PO TB24: 1.0000 | ORAL_TABLET | Freq: Every day | ORAL | 0 refills | Status: DC

## 2021-03-21 NOTE — Telephone Encounter (Signed)
Patient called back and reports she is having trouble getting medications from Mail order, medications sent to her local [facility]

## 2021-03-21 NOTE — Telephone Encounter (Signed)
Both of this were sent to mail order in April. Lvm for patient to let us know if she really need this or if she has not receive from mail order and needs short supply sent to local pharmacy

## 2021-04-03 DIAGNOSIS — H3554 Dystrophies primarily involving the retinal pigment epithelium: Secondary | ICD-10-CM | POA: Diagnosis not present

## 2021-04-03 DIAGNOSIS — H2513 Age-related nuclear cataract, bilateral: Secondary | ICD-10-CM | POA: Diagnosis not present

## 2021-04-25 DIAGNOSIS — M25561 Pain in right knee: Secondary | ICD-10-CM | POA: Diagnosis not present

## 2021-04-25 DIAGNOSIS — S76311A Strain of muscle, fascia and tendon of the posterior muscle group at thigh level, right thigh, initial encounter: Secondary | ICD-10-CM | POA: Diagnosis not present

## 2021-05-01 ENCOUNTER — Ambulatory Visit (HOSPITAL_BASED_OUTPATIENT_CLINIC_OR_DEPARTMENT_OTHER): Payer: Medicare Other

## 2021-05-04 DIAGNOSIS — R29898 Other symptoms and signs involving the musculoskeletal system: Secondary | ICD-10-CM | POA: Diagnosis not present

## 2021-05-04 DIAGNOSIS — S76311S Strain of muscle, fascia and tendon of the posterior muscle group at thigh level, right thigh, sequela: Secondary | ICD-10-CM | POA: Diagnosis not present

## 2021-05-04 DIAGNOSIS — M25561 Pain in right knee: Secondary | ICD-10-CM | POA: Diagnosis not present

## 2021-05-07 DIAGNOSIS — R29898 Other symptoms and signs involving the musculoskeletal system: Secondary | ICD-10-CM | POA: Diagnosis not present

## 2021-05-07 DIAGNOSIS — S76311S Strain of muscle, fascia and tendon of the posterior muscle group at thigh level, right thigh, sequela: Secondary | ICD-10-CM | POA: Diagnosis not present

## 2021-05-07 DIAGNOSIS — M25561 Pain in right knee: Secondary | ICD-10-CM | POA: Diagnosis not present

## 2021-05-16 DIAGNOSIS — S76311S Strain of muscle, fascia and tendon of the posterior muscle group at thigh level, right thigh, sequela: Secondary | ICD-10-CM | POA: Diagnosis not present

## 2021-05-16 DIAGNOSIS — R29898 Other symptoms and signs involving the musculoskeletal system: Secondary | ICD-10-CM | POA: Diagnosis not present

## 2021-05-16 DIAGNOSIS — M25561 Pain in right knee: Secondary | ICD-10-CM | POA: Diagnosis not present

## 2021-05-23 DIAGNOSIS — S76311D Strain of muscle, fascia and tendon of the posterior muscle group at thigh level, right thigh, subsequent encounter: Secondary | ICD-10-CM | POA: Diagnosis not present

## 2021-05-30 ENCOUNTER — Other Ambulatory Visit: Payer: Self-pay | Admitting: Family

## 2021-05-30 DIAGNOSIS — M25561 Pain in right knee: Secondary | ICD-10-CM | POA: Diagnosis not present

## 2021-05-30 DIAGNOSIS — R29898 Other symptoms and signs involving the musculoskeletal system: Secondary | ICD-10-CM | POA: Diagnosis not present

## 2021-05-30 DIAGNOSIS — S76311S Strain of muscle, fascia and tendon of the posterior muscle group at thigh level, right thigh, sequela: Secondary | ICD-10-CM | POA: Diagnosis not present

## 2021-06-05 ENCOUNTER — Other Ambulatory Visit: Payer: Self-pay | Admitting: Family

## 2021-07-11 ENCOUNTER — Other Ambulatory Visit (HOSPITAL_BASED_OUTPATIENT_CLINIC_OR_DEPARTMENT_OTHER): Payer: Self-pay | Admitting: Family

## 2021-07-11 DIAGNOSIS — Z1231 Encounter for screening mammogram for malignant neoplasm of breast: Secondary | ICD-10-CM

## 2021-08-06 ENCOUNTER — Ambulatory Visit (HOSPITAL_BASED_OUTPATIENT_CLINIC_OR_DEPARTMENT_OTHER)
Admission: RE | Admit: 2021-08-06 | Discharge: 2021-08-06 | Disposition: A | Discharge status: Home / Self Care | Payer: Medicare Other | Source: Ambulatory Visit | Attending: Family | Admitting: Family

## 2021-08-06 ENCOUNTER — Encounter (HOSPITAL_BASED_OUTPATIENT_CLINIC_OR_DEPARTMENT_OTHER): Payer: Self-pay

## 2021-08-06 ENCOUNTER — Other Ambulatory Visit: Payer: Self-pay

## 2021-08-06 DIAGNOSIS — Z1231 Encounter for screening mammogram for malignant neoplasm of breast: Secondary | ICD-10-CM | POA: Diagnosis not present

## 2021-08-09 ENCOUNTER — Ambulatory Visit (INDEPENDENT_AMBULATORY_CARE_PROVIDER_SITE_OTHER): Payer: Medicare Other | Admitting: Family

## 2021-08-09 ENCOUNTER — Other Ambulatory Visit (HOSPITAL_COMMUNITY)
Admission: RE | Admit: 2021-08-09 | Discharge: 2021-08-09 | Disposition: A | Discharge status: Home / Self Care | Payer: Medicare Other | Source: Ambulatory Visit | Attending: Family | Admitting: Family

## 2021-08-09 ENCOUNTER — Other Ambulatory Visit: Payer: Self-pay

## 2021-08-09 VITALS — BP 114/80 | HR 55 | Temp 97.9°F | Resp 16 | Ht 63.0 in | Wt 143.6 lb

## 2021-08-09 DIAGNOSIS — Z113 Encounter for screening for infections with a predominantly sexual mode of transmission: Secondary | ICD-10-CM | POA: Insufficient documentation

## 2021-08-09 DIAGNOSIS — E785 Hyperlipidemia, unspecified: Secondary | ICD-10-CM

## 2021-08-09 DIAGNOSIS — Z7251 High risk heterosexual behavior: Secondary | ICD-10-CM | POA: Insufficient documentation

## 2021-08-09 DIAGNOSIS — Z87898 Personal history of other specified conditions: Secondary | ICD-10-CM

## 2021-08-09 DIAGNOSIS — Z23 Encounter for immunization: Secondary | ICD-10-CM | POA: Diagnosis not present

## 2021-08-09 DIAGNOSIS — I10 Essential (primary) hypertension: Secondary | ICD-10-CM | POA: Diagnosis not present

## 2021-08-09 LAB — COMPREHENSIVE METABOLIC PANEL
ALT: 17 U/L (ref 0–35)
AST: 18 U/L (ref 0–37)
Albumin: 4.4 g/dL (ref 3.5–5.2)
Alkaline Phosphatase: 67 U/L (ref 39–117)
BUN: 22 mg/dL (ref 6–23)
CO2: 31 mEq/L (ref 19–32)
Calcium: 10 mg/dL (ref 8.4–10.5)
Chloride: 104 mEq/L (ref 96–112)
Creatinine, Ser: 0.71 mg/dL (ref 0.40–1.20)
GFR: 88.38 mL/min (ref >60.00)
Glucose, Bld: 101 mg/dL — ABNORMAL HIGH (ref 70–99)
Potassium: 4.7 mEq/L (ref 3.5–5.1)
Sodium: 140 mEq/L (ref 135–145)
Total Bilirubin: 0.6 mg/dL (ref 0.2–1.2)
Total Protein: 6.7 g/dL (ref 6.0–8.3)

## 2021-08-09 MED ORDER — SCOPOLAMINE 1 MG/3DAYS TD PT72: 1.0000 | MEDICATED_PATCH | TRANSDERMAL | 0 refills | Status: DC

## 2021-08-09 NOTE — Addendum Note (Signed)
Addended by: Jiles Prows on: 08/09/2021 09:17 AM   Modules accepted: Orders

## 2021-08-09 NOTE — Assessment & Plan Note (Signed)
>>  ASSESSMENT AND PLAN FOR DYSLIPIDEMIA WRITTEN ON 08/09/2021  8:59 AM BY [name], NP  Lab Results  Component Value Date   CHOL 168 02/05/2021   HDL 63.70 02/05/2021   LDLCALC 85 02/05/2021   LDLDIRECT 102.0 08/06/2015   TRIG 98.0 02/05/2021   CHOLHDL 3 02/05/2021   Maintained on atovastatin, at goal.

## 2021-08-09 NOTE — Assessment & Plan Note (Signed)
She is interested in HIV prep therapy which I think is a good idea for her.  Will check labs/HIV testing first.

## 2021-08-09 NOTE — Assessment & Plan Note (Signed)
BP Readings from Last 3 Encounters:  08/09/21 114/80  03/15/21 122/75  02/05/21 139/82   At goal on lisinopril 30 and metoprolol 25mg  once daily.  Continue same.

## 2021-08-09 NOTE — Progress Notes (Signed)
Subjective:   By signing my name below, I, Lyric Barr-McArthur, attest that this documentation has been prepared under the direction and in the presence of [name], NP, 08/09/2021  Patient ID: Melissa Grimes, female    DOB: [birth year], 65 y.o.   MRN: [id number]  Chief Complaint  Patient presents with   Follow-up   Hypertension    Here for follow up    HPI Patient is in today for an office visit.   HIV prep therapy: She is interested in getting HIV prep therapy due to her multiple shared sexual partners with her husband. She will be attending a "lifestyle" cruise where she plans to have multiple new partners and also a "lifestyle" vacation in the [place].  STD testing: She is interested in getting STD tested today.  Blood pressure: Her blood pressure was recorded in the office today and it was doing well.  BP Readings from Last 3 Encounters:  08/09/21 114/80  03/15/21 122/75  02/05/21 139/82    Medications: She is compliant with taking 20 mg Atorvastatin, 30 mg lisinopril and 25 mg metoprolol. She takes vitamin C and vitamin D, fish oil and a multi vitamin. She also takes a baby Asprin for stroke prevention. Mammogram: She had a mammogram performed on 08/06/2021 and results have not been concluded yet.  Immunizations: She is interested in receiving her flu shot during her visit today. Motion sickness: She and her husband are going on a cruise and she is interested in being prescribed something that will help with her motion sickness when they are sailing.   Health Maintenance Due  Topic Date Due   INFLUENZA VACCINE  06/17/2021    Past Medical History:  Diagnosis Date   Arthritis    Complication of anesthesia    difficulty urinating after   Hyperlipidemia    Hypertension     Past Surgical History:  Procedure Laterality Date   BUNIONECTOMY     CESAREAN SECTION     x 2   HERNIA REPAIR Left 1977   inguinal   [place] Right 01/18/2013   Procedure: Right TOTAL HIP ARTHROPLASTY ANTERIOR APPROACH;  Surgeon: Mcarthur Rossetti, MD;  Location: [facility];  Service: Orthopedics;  Laterality: Right;   TOTAL HIP ARTHROPLASTY Left 08/09/2013   Procedure: LEFT TOTAL HIP ARTHROPLASTY ANTERIOR APPROACH;  Surgeon: Mcarthur Rossetti, MD;  Location: [facility];  Service: Orthopedics;  Laterality: Left;    Family History  Problem Relation Age of Onset   Arthritis Mother        osteoarthritis   Hyperlipidemia Mother    Heart disease Mother    Hypertension Mother    Hypothyroidism Mother    Hyperlipidemia Father    Heart disease Father    Stroke Father    Parkinson's disease Father    Cancer Cousin        breast   Cancer Maternal Grandfather        lung   Obesity Sister        "overweight"   Hypertension Sister    Varicose Veins Child    Asthma Child    Migraines Child     Social History   Socioeconomic History   Marital status: Married    Spouse name: Not on file   Number of children: 2   Years of education: Not on file   Highest education level: Not on file  Occupational History   Not on file  Tobacco Use   Smoking status: Never   Smokeless tobacco: Never  Vaping Use   Vaping Use: Never used  Substance and Sexual Activity   Alcohol use: Yes    Alcohol/week: 3.0 standard drinks    Types: 3 Glasses of wine per week   Drug use: No   Sexual activity: Yes  Other Topics Concern   Not on file  Social History Narrative   Corporate treasurer   Completed 2 yrs college   Married- from [place]   2 children- grown both in [place]   Enjoys Adult nurse ed material   1 cat   Social Determinants of Radio broadcast assistant Strain: Not on file  Food Insecurity: Not on file  Transportation Needs: Not on file  Physical Activity: Not on file  Stress: Not on file  Social Connections: Not on file  Intimate Partner Violence: Not on file    Outpatient Medications Prior to Visit   Medication Sig Dispense Refill   Ascorbic Acid (VITAMIN C) 500 MG CAPS Take by mouth.     aspirin EC 81 MG tablet Take 81 mg by mouth daily.     atorvastatin (LIPITOR) 20 MG tablet Take 1 tablet (20 mg total) by mouth daily. 90 tablet 0   Calcium Citrate-Vitamin D 500-400 MG-UNIT CHEW Chew 1 tablet by mouth 2 (two) times daily.      lisinopril (ZESTRIL) 30 MG tablet Take 1 tablet (30 mg total) by mouth daily. 90 tablet 1   metoprolol succinate (TOPROL-XL) 25 MG 24 hr tablet Take 1 tablet (25 mg total) by mouth daily. 90 tablet 0   Multiple Vitamin (MULTIVITAMIN) tablet Take 1 tablet by mouth daily.     Multiple Vitamins-Minerals (PRESERVISION AREDS PO) Take by mouth.     Omega-3 Fatty Acids (FISH OIL) 1000 MG CAPS Take 1 capsule by mouth daily.     VITAMIN D, CHOLECALCIFEROL, PO Take by mouth.     fluticasone (FLONASE) 50 MCG/ACT nasal spray Place 2 sprays into both nostrils daily. (Patient not taking: Reported on 03/15/2021) 16 g 1   methocarbamol (ROBAXIN) 500 MG tablet TAKE 1 TABLET BY MOUTH EVERY 6 HOURS AS NEEDED FOR MUSCLE SPASM (Patient not taking: Reported on 03/15/2021) 40 tablet 0   No facility-administered medications prior to visit.    Allergies  Allergen Reactions   Achromycin [Tetracycline] Hives    ROS    See HPI Objective:    Physical Exam Constitutional:      General: She is not in acute distress.    Appearance: Normal appearance. She is not ill-appearing.  HENT:     Head: Normocephalic and atraumatic.     Right Ear: External ear normal.     Left Ear: External ear normal.  Eyes:     Extraocular Movements: Extraocular movements intact.     Pupils: Pupils are equal, round, and reactive to light.  Cardiovascular:     Rate and Rhythm: Normal rate and regular rhythm.     Heart sounds: Normal heart sounds. No murmur heard.   No gallop.  Pulmonary:     Effort: Pulmonary effort is normal. No respiratory distress.     Breath sounds: Normal breath sounds. No  wheezing or rales.  Skin:    General: Skin is warm and dry.  Neurological:     Mental Status: She is alert and oriented to person, place, and time.  Psychiatric:        Behavior: Behavior normal.  Judgment: Judgment normal.    BP 114/80 (BP Location: Right Arm, Patient Position: Sitting, Cuff Size: Small)   Pulse (!) 55   Temp 97.9 F (36.6 C) (Oral)   Resp 16   Ht _0  (1.6 m)   Wt 143 lb 9.6 oz (65.1 kg)   LMP 11/17/2010   SpO2 98%   BMI 25.44 kg/m  Wt Readings from Last 3 Encounters:  08/09/21 143 lb 9.6 oz (65.1 kg)  03/15/21 140 lb (63.5 kg)  02/05/21 144 lb 3.2 oz (65.4 kg)       Assessment & Plan:   Problem List Items Addressed This Visit       Unprioritized   HTN (hypertension)    BP Readings from Last 3 Encounters:  08/09/21 114/80  03/15/21 122/75  02/05/21 139/82  At goal on lisinopril 30 and metoprolol 32m once daily.  Continue same.       Relevant Orders   Comp Met (CMET)   History of motion sickness    Would like rx for cruise.       Relevant Medications   scopolamine (TRANSDERM-SCOP, 1.5 MG,) 1 MG/3DAYS   High risk sexual behavior    She is interested in HIV prep therapy which I think is a good idea for her.  Will check labs/HIV testing first.       Dyslipidemia    Lab Results  Component Value Date   CHOL 168 02/05/2021   HDL 63.70 02/05/2021   LDLCALC 85 02/05/2021   LDLDIRECT 102.0 08/06/2015   TRIG 98.0 02/05/2021   CHOLHDL 3 02/05/2021  Maintained on atovastatin, at goal.       Other Visit Diagnoses     Screening examination for STD (sexually transmitted disease)    -  Primary   Relevant Orders   Urine cytology ancillary only([facility])   HIV antibody (with reflex)   HSV 2 antibody, IgG   RPR   Hepatitis, Acute      Meds ordered this encounter  Medications   scopolamine (TRANSDERM-SCOP, 1.5 MG,) 1 MG/3DAYS    Sig: Place 1 patch (1.5 mg total) onto the skin every 3 (three) days.    Dispense:  4 patch     Refill:  0    Order Specific Question:   Supervising Provider    Answer:   BPenni HomansA [[id number]]    I, MDebbrah Alar NP, personally preformed the services described in this documentation.  All medical record entries made by the scribe were at my direction and in my presence.  I have reviewed the chart and discharge instructions (if applicable) and agree that the record reflects my personal performance and is accurate and complete. 08/09/2021  I,Lyric Barr-McArthur,acting as a scribe for MNance Pear NP.,have documented all relevant documentation on the behalf of MNance Pear NP,as directed by  MNance Pear NP while in the presence of MNance Pear NP.  MNance Pear NP

## 2021-08-09 NOTE — Assessment & Plan Note (Signed)
Would like rx for cruise.

## 2021-08-09 NOTE — Assessment & Plan Note (Signed)
Lab Results  Component Value Date   CHOL 168 02/05/2021   HDL 63.70 02/05/2021   LDLCALC 85 02/05/2021   LDLDIRECT 102.0 08/06/2015   TRIG 98.0 02/05/2021   CHOLHDL 3 02/05/2021   Maintained on atovastatin, at goal.

## 2021-08-12 LAB — HEPATITIS PANEL, ACUTE
Hep A IgM: NONREACTIVE
Hep B C IgM: NONREACTIVE
Hepatitis B Surface Ag: NONREACTIVE
Hepatitis C Ab: NONREACTIVE
SIGNAL TO CUT-OFF: 0.09 (ref <1.00)

## 2021-08-12 LAB — HIV ANTIBODY (ROUTINE TESTING W REFLEX): HIV 1&2 Ab, 4th Generation: NONREACTIVE

## 2021-08-12 LAB — URINE CYTOLOGY ANCILLARY ONLY
Chlamydia: NEGATIVE
Comment: NEGATIVE
Comment: NEGATIVE
Comment: NORMAL
Neisseria Gonorrhea: NEGATIVE
Trichomonas: NEGATIVE

## 2021-08-12 LAB — HSV 2 ANTIBODY, IGG: HSV 2 Glycoprotein G Ab, IgG: <0.9 index

## 2021-08-12 LAB — RPR: RPR Ser Ql: NONREACTIVE

## 2021-08-13 ENCOUNTER — Telehealth: Payer: Self-pay | Admitting: Family

## 2021-08-13 MED ORDER — EMTRICITABINE-TENOFOVIR DF 200-300 MG PO TABS: 1.0000 | ORAL_TABLET | Freq: Every day | ORAL | 0 refills | Status: DC

## 2021-08-13 NOTE — Telephone Encounter (Signed)
Noted  

## 2021-08-13 NOTE — Telephone Encounter (Signed)
I have received a smooth transfer from front desk.   Pt wanted to know her results. I have relayed the information and she stated understanding. Pt also reports that she would like all her rx to got [facility] in the future.   Thanks

## 2021-08-13 NOTE — Telephone Encounter (Signed)
STD testing negative.  I sent rx to [facility] for Truvada. She needs to follow up in 3 months for office visit and HIV testing to continue Truvada.

## 2021-08-26 ENCOUNTER — Other Ambulatory Visit: Payer: Self-pay | Admitting: Family

## 2021-08-29 ENCOUNTER — Encounter: Payer: Self-pay | Admitting: Family

## 2021-09-16 ENCOUNTER — Encounter: Payer: Self-pay | Admitting: Family

## 2021-09-16 MED ORDER — CIPROFLOXACIN HCL 750 MG PO TABS: ORAL_TABLET | ORAL | 0 refills | Status: DC

## 2021-10-08 ENCOUNTER — Telehealth: Payer: Self-pay | Admitting: Family

## 2021-10-08 MED ORDER — MOLNUPIRAVIR 200 MG PO CAPS: 4.0000 | ORAL_CAPSULE | Freq: Two times a day (BID) | ORAL | 0 refills | Status: AC

## 2021-10-08 NOTE — Telephone Encounter (Signed)
Husband sent message that he and his wife returned last night from a cruise and were very tired. They both tested positive for COVID. Will rx with molnupiravir.

## 2021-10-15 ENCOUNTER — Encounter: Payer: Self-pay | Admitting: Family

## 2021-10-21 ENCOUNTER — Encounter: Payer: Self-pay | Admitting: Family

## 2021-10-22 ENCOUNTER — Other Ambulatory Visit (HOSPITAL_COMMUNITY)
Admission: RE | Admit: 2021-10-22 | Discharge: 2021-10-22 | Disposition: A | Discharge status: Home / Self Care | Payer: Medicare Other | Source: Ambulatory Visit | Attending: Family | Admitting: Family

## 2021-10-22 ENCOUNTER — Ambulatory Visit (INDEPENDENT_AMBULATORY_CARE_PROVIDER_SITE_OTHER): Payer: Medicare Other | Admitting: Family

## 2021-10-22 ENCOUNTER — Encounter: Payer: Self-pay | Admitting: Family

## 2021-10-22 VITALS — BP 119/79 | HR 57 | Temp 98.0°F | Ht 63.0 in | Wt 145.8 lb

## 2021-10-22 DIAGNOSIS — Z113 Encounter for screening for infections with a predominantly sexual mode of transmission: Secondary | ICD-10-CM | POA: Insufficient documentation

## 2021-10-22 DIAGNOSIS — Z5181 Encounter for therapeutic drug level monitoring: Secondary | ICD-10-CM

## 2021-10-22 DIAGNOSIS — M25562 Pain in left knee: Secondary | ICD-10-CM

## 2021-10-22 LAB — CBC WITH DIFFERENTIAL/PLATELET
Basophils Absolute: 0 10*3/uL (ref 0.0–0.1)
Basophils Relative: 0.6 % (ref 0.0–3.0)
Eosinophils Absolute: 0.2 10*3/uL (ref 0.0–0.7)
Eosinophils Relative: 3 % (ref 0.0–5.0)
HCT: 39.7 % (ref 36.0–46.0)
Hemoglobin: 13.2 g/dL (ref 12.0–15.0)
Lymphocytes Relative: 23.7 % (ref 12.0–46.0)
Lymphs Abs: 1.5 10*3/uL (ref 0.7–4.0)
MCHC: 33.3 g/dL (ref 30.0–36.0)
MCV: 97.4 fl (ref 78.0–100.0)
Monocytes Absolute: 0.5 10*3/uL (ref 0.1–1.0)
Monocytes Relative: 8.2 % (ref 3.0–12.0)
Neutro Abs: 4.1 10*3/uL (ref 1.4–7.7)
Neutrophils Relative %: 64.5 % (ref 43.0–77.0)
Platelets: 297 10*3/uL (ref 150.0–400.0)
RBC: 4.07 Mil/uL (ref 3.87–5.11)
RDW: 13.2 % (ref 11.5–15.5)
WBC: 6.3 10*3/uL (ref 4.0–10.5)

## 2021-10-22 LAB — URINALYSIS, ROUTINE W REFLEX MICROSCOPIC
Bilirubin Urine: NEGATIVE
Hgb urine dipstick: NEGATIVE
Ketones, ur: NEGATIVE
Nitrite: NEGATIVE
Specific Gravity, Urine: 1.02 (ref 1.000–1.030)
Total Protein, Urine: NEGATIVE
Urine Glucose: NEGATIVE
Urobilinogen, UA: 0.2 (ref 0.0–1.0)
pH: 6 (ref 5.0–8.0)

## 2021-10-22 LAB — COMPREHENSIVE METABOLIC PANEL
ALT: 27 U/L (ref 0–35)
AST: 24 U/L (ref 0–37)
Albumin: 4.3 g/dL (ref 3.5–5.2)
Alkaline Phosphatase: 79 U/L (ref 39–117)
BUN: 23 mg/dL (ref 6–23)
CO2: 30 mEq/L (ref 19–32)
Calcium: 9.5 mg/dL (ref 8.4–10.5)
Chloride: 104 mEq/L (ref 96–112)
Creatinine, Ser: 0.75 mg/dL (ref 0.40–1.20)
GFR: 82.64 mL/min (ref >60.00)
Glucose, Bld: 85 mg/dL (ref 70–99)
Potassium: 4.6 mEq/L (ref 3.5–5.1)
Sodium: 141 mEq/L (ref 135–145)
Total Bilirubin: 0.4 mg/dL (ref 0.2–1.2)
Total Protein: 6.6 g/dL (ref 6.0–8.3)

## 2021-10-22 MED ORDER — MELOXICAM 7.5 MG PO TABS: 7.5000 mg | ORAL_TABLET | Freq: Every day | ORAL | 0 refills | Status: DC

## 2021-10-22 NOTE — Assessment & Plan Note (Signed)
New. Pt is advised as follows:  Please begin meloxicam 7.5mg  once daily as needed for knee pain (Anti-inflammatory). Ice knee twice daily.  Send me a mychart message if knee pain is not improved in 1 week and we will set up a visit with sports medicine.

## 2021-10-22 NOTE — Patient Instructions (Addendum)
Please begin meloxicam 7.5mg  once daily as needed for knee pain (Anti-inflammatory). Ice knee twice daily.  Send me a mychart message if knee pain is not improved in 1 week and we will set up a visit with sports medicine.

## 2021-10-22 NOTE — Progress Notes (Signed)
Subjective:   By signing my name below, I, Melissa Grimes, attest that this documentation has been prepared under the direction and in the presence of [name], NP, 10/22/2021   Patient ID: Melissa Grimes, female    DOB: 1955-12-02, 65 y.o.   MRN: [id number]  Chief Complaint  Patient presents with   left knee pain     Pain for the last week with walking     HPI Patient is in today for an office visit.  Knee pain: She is presenting with left knee pain. She mentions swelling and pain on the interior side of her knee cap. She denies any bruising or injury to the area. She does note a history of knee pain in her right knee that has since resolved due to her physical therapy. Her previous right knee pain was caused due to an issue with her hamstring.   She is also maintained on Truvada for HIV prophylaxis. She is requesting STD testing today.   Health Maintenance Due  Topic Date Due   COVID-19 Vaccine (5 - Booster for [employer] series) 05/06/2021    Past Medical History:  Diagnosis Date   Arthritis    Complication of anesthesia    difficulty urinating after   Hyperlipidemia    Hypertension     Past Surgical History:  Procedure Laterality Date   BUNIONECTOMY     CESAREAN SECTION     x 2   HERNIA REPAIR Left 1977   inguinal   OSTEOTOMY  [place] ARTHROPLASTY Right 01/18/2013   Procedure: Right TOTAL HIP ARTHROPLASTY ANTERIOR APPROACH;  Surgeon: Mcarthur Rossetti, MD;  Location: [facility];  Service: Orthopedics;  Laterality: Right;   TOTAL HIP ARTHROPLASTY Left 08/09/2013   Procedure: LEFT TOTAL HIP ARTHROPLASTY ANTERIOR APPROACH;  Surgeon: Mcarthur Rossetti, MD;  Location: [facility];  Service: Orthopedics;  Laterality: Left;    Family History  Problem Relation Age of Onset   Arthritis Mother        osteoarthritis   Hyperlipidemia Mother    Heart disease Mother    Hypertension Mother    Hypothyroidism Mother    Hyperlipidemia Father     Heart disease Father    Stroke Father    Parkinson's disease Father    Cancer Cousin        breast   Cancer Maternal Grandfather        lung   Obesity Sister        "overweight"   Hypertension Sister    Varicose Veins Child    Asthma Child    Migraines Child     Social History   Socioeconomic History   Marital status: Married    Spouse name: Not on file   Number of children: 2   Years of education: Not on file   Highest education level: Not on file  Occupational History   Not on file  Tobacco Use   Smoking status: Never   Smokeless tobacco: Never  Vaping Use   Vaping Use: Never used  Substance and Sexual Activity   Alcohol use: Yes    Alcohol/week: 3.0 standard drinks    Types: 3 Glasses of wine per week   Drug use: No   Sexual activity: Yes  Other Topics Concern   Not on file  Social History Narrative   Graphic designer   Completed 2 yrs college   Married- from [place]   2 children- grown both in [place]  Enjoys Adult nurse ed material   1 cat   Social Determinants of Health   Financial Resource Strain: Not on file  Food Insecurity: Not on file  Transportation Needs: Not on file  Physical Activity: Not on file  Stress: Not on file  Social Connections: Not on file  Intimate Partner Violence: Not on file    Outpatient Medications Prior to Visit  Medication Sig Dispense Refill   Ascorbic Acid (VITAMIN C) 500 MG CAPS Take by mouth.     aspirin EC 81 MG tablet Take 81 mg by mouth daily.     atorvastatin (LIPITOR) 20 MG tablet Take 1 tablet (20 mg total) by mouth daily. 90 tablet 0   Calcium Citrate-Vitamin D 500-400 MG-UNIT CHEW Chew 1 tablet by mouth 2 (two) times daily.      emtricitabine-tenofovir (TRUVADA) 200-300 MG tablet Take 1 tablet by mouth daily. 90 tablet 0   lisinopril (ZESTRIL) 30 MG tablet Take 1 tablet (30 mg total) by mouth daily. 90 tablet 1   metoprolol succinate (TOPROL-XL) 25 MG 24 hr tablet Take 1 tablet (25 mg total) by  mouth daily. 90 tablet 0   Multiple Vitamin (MULTIVITAMIN) tablet Take 1 tablet by mouth daily.     Multiple Vitamins-Minerals (PRESERVISION AREDS PO) Take by mouth.     Omega-3 Fatty Acids (FISH OIL) 1000 MG CAPS Take 1 capsule by mouth daily.     scopolamine (TRANSDERM-SCOP, 1.5 MG,) 1 MG/3DAYS Place 1 patch (1.5 mg total) onto the skin every 3 (three) days. (Patient not taking: Reported on 10/22/2021) 4 patch 0   VITAMIN D, CHOLECALCIFEROL, PO Take by mouth.     ciprofloxacin (CIPRO) 750 MG tablet Take 1 tablet by mouth once as needed for traveller's diarrhea 2 tablet 0   No facility-administered medications prior to visit.    Allergies  Allergen Reactions   Achromycin [Tetracycline] Hives    Review of Systems  Musculoskeletal:  Positive for joint pain (in left knee).       (+) pain left knee medially beneath patella      Objective:    Physical Exam Constitutional:      General: She is not in acute distress.    Appearance: Normal appearance. She is not ill-appearing.  HENT:     Head: Normocephalic and atraumatic.     Right Ear: External ear normal.     Left Ear: External ear normal.  Eyes:     Extraocular Movements: Extraocular movements intact.     Pupils: Pupils are equal, round, and reactive to light.  Cardiovascular:     Rate and Rhythm: Normal rate and regular rhythm.     Heart sounds: Normal heart sounds. No murmur heard.   No gallop.  Pulmonary:     Effort: Pulmonary effort is normal. No respiratory distress.     Breath sounds: Normal breath sounds. No wheezing or rales.  Musculoskeletal:        General: Swelling (above left knee) present.     Comments: (+) medial left knee tenderness (-) drawer test  Lymphadenopathy:     Cervical: No cervical adenopathy.  Skin:    General: Skin is warm and dry.  Neurological:     Mental Status: She is alert and oriented to person, place, and time.  Psychiatric:        Behavior: Behavior normal.        Judgment: Judgment  normal.    BP 119/79   Pulse (!) 57   Temp 98  F (36.7 C) (Oral)   Ht 5' 3"  (1.6 m)   Wt 145 lb 12.8 oz (66.1 kg)   LMP 11/17/2010   SpO2 100%   BMI 25.83 kg/m  Wt Readings from Last 3 Encounters:  10/22/21 145 lb 12.8 oz (66.1 kg)  08/09/21 143 lb 9.6 oz (65.1 kg)  03/15/21 140 lb (63.5 kg)       Assessment & Plan:   Problem List Items Addressed This Visit       Unprioritized   Acute pain of left knee    New. Pt is advised as follows:  Please begin meloxicam 7.63m once daily as needed for knee pain (Anti-inflammatory). Ice knee twice daily.  Send me a mychart message if knee pain is not improved in 1 week and we will set up a visit with sports medicine.       Other Visit Diagnoses     Screening examination for STD (sexually transmitted disease)    -  Primary   Relevant Orders   HIV antibody (with reflex)   Urine cytology ancillary only([facility])   RPR   HSV 2 antibody, IgG   Hepatitis B Surface AntiGEN   Therapeutic drug monitoring       Relevant Orders   Comp Met (CMET)   CBC with Differential/Platelet   Urinalysis, Routine w reflex microscopic      Meds ordered this encounter  Medications   meloxicam (MOBIC) 7.5 MG tablet    Sig: Take 1 tablet (7.5 mg total) by mouth daily.    Dispense:  14 tablet    Refill:  0    Order Specific Question:   Supervising Provider    Answer:   BPenni HomansA [[id number]]    I, MDebbrah Alar NP, personally preformed the services described in this documentation.  All medical record entries made by the scribe were at my direction and in my presence.  I have reviewed the chart and discharge instructions (if applicable) and agree that the record reflects my personal performance and is accurate and complete. 10/22/2021  I,Melissa Grimes,acting as a scribe for MNance Pear NP.,have documented all relevant documentation on the behalf of MNance Pear NP,as directed by  MNance Pear NP while in  the presence of MNance Pear NP.  MNance Pear NP

## 2021-10-23 LAB — HSV 2 ANTIBODY, IGG: HSV 2 Glycoprotein G Ab, IgG: <0.9 index

## 2021-10-23 LAB — HIV ANTIBODY (ROUTINE TESTING W REFLEX): HIV 1&2 Ab, 4th Generation: NONREACTIVE

## 2021-10-23 LAB — RPR: RPR Ser Ql: NONREACTIVE

## 2021-10-23 LAB — URINE CYTOLOGY ANCILLARY ONLY
Chlamydia: NEGATIVE
Comment: NEGATIVE
Comment: NEGATIVE
Comment: NORMAL
Neisseria Gonorrhea: NEGATIVE
Trichomonas: NEGATIVE

## 2021-10-23 LAB — HEPATITIS B SURFACE ANTIGEN: Hepatitis B Surface Ag: NONREACTIVE

## 2021-11-07 ENCOUNTER — Encounter: Payer: Self-pay | Admitting: Family

## 2021-11-21 ENCOUNTER — Other Ambulatory Visit: Payer: Self-pay | Admitting: Family

## 2021-11-22 MED ORDER — METOPROLOL SUCCINATE ER 25 MG PO TB24: 25.0000 mg | ORAL_TABLET | Freq: Every day | ORAL | 1 refills | Status: DC

## 2021-11-23 ENCOUNTER — Other Ambulatory Visit: Payer: Self-pay | Admitting: Family

## 2021-11-25 ENCOUNTER — Telehealth: Payer: Self-pay | Admitting: Family

## 2021-11-25 DIAGNOSIS — H3554 Dystrophies primarily involving the retinal pigment epithelium: Secondary | ICD-10-CM | POA: Diagnosis not present

## 2021-11-25 DIAGNOSIS — H2513 Age-related nuclear cataract, bilateral: Secondary | ICD-10-CM | POA: Diagnosis not present

## 2021-11-25 MED ORDER — EMTRICITABINE-TENOFOVIR DF 200-300 MG PO TABS: 1.0000 | ORAL_TABLET | Freq: Every day | ORAL | 0 refills | Status: DC

## 2021-11-25 MED ORDER — EMTRICITABINE-TENOFOVIR DF 200-300 MG PO TABS: 1.0000 | ORAL_TABLET | Freq: Every day | ORAL | 1 refills | Status: DC

## 2021-11-25 NOTE — Telephone Encounter (Signed)
See mychart.  

## 2021-11-29 ENCOUNTER — Other Ambulatory Visit: Payer: Self-pay | Admitting: Family

## 2021-11-29 MED ORDER — EMTRICITABINE-TENOFOVIR DF 200-300 MG PO TABS: 1.0000 | ORAL_TABLET | Freq: Every day | ORAL | 0 refills | Status: DC

## 2021-12-08 HISTORY — PX: OTHER SURGICAL HISTORY: SHX169

## 2021-12-20 NOTE — Progress Notes (Signed)
Subjective:   LENDY DITTRICH is a 66 y.o. female who presents for an Initial Medicare Annual Wellness Visit.  Review of Systems     Cardiac Risk Factors include: hypertension;advanced age (>64men, >5 women);dyslipidemia     Objective:    Today's Vitals   12/23/21 0817  BP: 113/73  Pulse: (!) 58  Resp: 16  Temp: 97.8 F (36.6 C)  TempSrc: Oral  SpO2: 97%  Weight: 142 lb (64.4 kg)  Height: 5\' 3"  (1.6 m)   Body mass index is 25.15 kg/m.  Advanced Directives 12/23/2021 10/18/2018 08/14/2013 08/11/2013 08/01/2013 01/18/2013 01/11/2013  Does Patient Have a Medical Advance Directive? Yes No Patient has advance directive, copy not in chart Patient has advance directive, copy not in chart Patient has advance directive, copy not in chart Patient has advance directive, copy not in chart Patient has advance directive, copy not in chart  Type of Advance Directive [employer];Living will - Living will;Healthcare Power of [place];Living will [place];Living will  Copy of [place] in Chart? No - copy requested - Copy requested from family Copy requested from family - Copy requested from family -  Pre-existing out of facility DNR order (yellow form or pink MOST form) - - No No No No -    Current Medications (verified) Outpatient Encounter Medications as of 12/23/2021  Medication Sig   Ascorbic Acid (VITAMIN C) 500 MG CAPS Take by mouth.   aspirin EC 81 MG tablet Take 81 mg by mouth daily.   atorvastatin (LIPITOR) 20 MG tablet Take 1 tablet (20 mg total) by mouth daily.   Calcium Citrate-Vitamin D 500-400 MG-UNIT CHEW Chew 1 tablet by mouth 2 (two) times daily.    emtricitabine-tenofovir (TRUVADA) 200-300 MG tablet Take 1 tablet by mouth daily.   lisinopril (ZESTRIL) 30 MG tablet Take 1 tablet (30 mg total) by mouth daily.   metoprolol succinate (TOPROL-XL) 25 MG 24 hr tablet Take 1 tablet (25 mg total) by  mouth daily.   Multiple Vitamin (MULTIVITAMIN) tablet Take 1 tablet by mouth daily.   Multiple Vitamins-Minerals (PRESERVISION AREDS PO) Take by mouth.   Omega-3 Fatty Acids (FISH OIL) 1000 MG CAPS Take 1 capsule by mouth daily.   VITAMIN D, CHOLECALCIFEROL, PO Take by mouth.   [DISCONTINUED] emtricitabine-tenofovir (TRUVADA) 200-300 MG tablet Take 1 tablet by mouth daily.   [DISCONTINUED] scopolamine (TRANSDERM-SCOP, 1.5 MG,) 1 MG/3DAYS Place 1 patch (1.5 mg total) onto the skin every 3 (three) days. (Patient not taking: Reported on 10/22/2021)   No facility-administered encounter medications on file as of 12/23/2021.    Allergies (verified) Achromycin [tetracycline]   History: Past Medical History:  Diagnosis Date   Arthritis    Complication of anesthesia    difficulty urinating after   Hyperlipidemia    Hypertension    Past Surgical History:  Procedure Laterality Date   BUNIONECTOMY     CESAREAN SECTION     x 2   HERNIA REPAIR Left 1977   inguinal   OSTEOTOMY  [place] ARTHROPLASTY Right 01/18/2013   Procedure: Right TOTAL HIP ARTHROPLASTY ANTERIOR APPROACH;  Surgeon: Mcarthur Rossetti, MD;  Location: [place];  Service: Orthopedics;  Laterality: Right;   TOTAL HIP ARTHROPLASTY Left 08/09/2013   Procedure: LEFT TOTAL HIP ARTHROPLASTY ANTERIOR APPROACH;  Surgeon: Mcarthur Rossetti, MD;  Location: [place];  Service: Orthopedics;  Laterality: Left;   Family History  Problem Relation Age of Onset   Arthritis Mother        osteoarthritis   Hyperlipidemia Mother    Heart disease Mother    Hypertension Mother    Hypothyroidism Mother    Hyperlipidemia Father    Heart disease Father    Stroke Father    Parkinson's disease Father    Cancer Cousin        breast   Cancer Maternal Grandfather        lung   Obesity Sister        "overweight"   Hypertension Sister    Varicose Veins Child    Asthma Child    Migraines Child    Social History    Socioeconomic History   Marital status: Married    Spouse name: Not on file   Number of children: 2   Years of education: Not on file   Highest education level: Not on file  Occupational History   Not on file  Tobacco Use   Smoking status: Never   Smokeless tobacco: Never  Vaping Use   Vaping Use: Never used  Substance and Sexual Activity   Alcohol use: Yes    Alcohol/week: 3.0 standard drinks    Types: 3 Glasses of wine per week   Drug use: No   Sexual activity: Yes  Other Topics Concern   Not on file  Social History Narrative   Corporate treasurer   Completed 2 yrs college   Married- from [place]   2 children- grown both in [place]   Enjoys Adult nurse ed material   1 cat   Social Determinants of Radio broadcast assistant Strain: Low Risk    Difficulty of Paying Living Expenses: Not hard at all  Food Insecurity: No Food Insecurity   Worried About Charity fundraiser in the Last Year: Never true   Arboriculturist in the Last Year: Never true  Transportation Needs: No Transportation Needs   Lack of Transportation (Medical): No   Lack of Transportation (Non-Medical): No  Physical Activity: Sufficiently Active   Days of Exercise per Week: 2 days   Minutes of Exercise per Session: 120 min  Stress: No Stress Concern Present   Feeling of Stress : Not at all  Social Connections: Socially Integrated   Frequency of Communication with Friends and Family: Once a week   Frequency of Social Gatherings with Friends and Family: More than three times a week   Attends Religious Services: More than 4 times per year   Active Member of [employer] or Organizations: Yes   Attends Music therapist: More than 4 times per year   Marital Status: Married    Tobacco Counseling Counseling given: Not Answered   Clinical Intake:  Pre-visit preparation completed: Yes  Pain : No/denies pain     BMI - recorded: 25.15 Nutritional Status: BMI 25 -29  Overweight Nutritional Risks: None Diabetes: No  How often do you need to have someone help you when you read instructions, pamphlets, or other written materials from your doctor or pharmacy?: 1 - Never  Diabetic?No  Interpreter Needed?: No  Information entered by :: Caroleen Hamman LPN   Activities of Daily Living In your present state of health, do you have any difficulty performing the following activities: 12/23/2021 02/05/2021  Hearing? N N  Vision? N N  Difficulty concentrating or making decisions? N N  Walking or climbing stairs? N N  Dressing or bathing? N N  Doing errands, shopping? N N  Preparing Food and eating ? N -  Using the Toilet? N -  In the past six months, have you accidently leaked urine? N -  Do you have problems with loss of bowel control? N -  Managing your Medications? N -  Managing your Finances? N -  Housekeeping or managing your Housekeeping? N -  Some recent data might be hidden    Patient Care Team: [name], NP as PCP - General (Internal Medicine)  Indicate any recent Medical Services you may have received from other than [facility] providers in the past year (date may be approximate).     Assessment:   This is a routine wellness examination for [place].  Hearing/Vision screen Hearing Screening - Comments:: C/o slight hearing loss Vision Screening - Comments:: Last eye exam-11/2021-[facility]  Dietary issues and exercise activities discussed: Current Exercise Habits: Home exercise routine, Type of exercise: walking, Time (Minutes): 60, Frequency (Times/Week): 2, Weekly Exercise (Minutes/Week): 120, Intensity: Mild, Exercise limited by: None identified   Goals Addressed             This Visit's Progress    Patient Stated       Continue going to the gym & lose some weight       Depression Screen PHQ 2/9 Scores 12/23/2021 10/22/2021 02/05/2021 10/08/2018 07/24/2017  PHQ - 2 Score 0 0 0 0 0  PHQ- 9 Score - - 3 3 -    Fall  Risk Fall Risk  12/23/2021 10/22/2021  Falls in the past year? 0 0  Number falls in past yr: 0 0  Injury with Fall? 0 0  Risk for fall due to : - No Fall Risks  Follow up Falls prevention discussed Falls evaluation completed    [place]:  Any stairs in or around the home? Yes  If so, are there any without handrails? No  Home free of loose throw rugs in walkways, pet beds, electrical cords, etc? No rugs Adequate lighting in your home to reduce risk of falls? Yes   ASSISTIVE DEVICES UTILIZED TO PREVENT FALLS:  Life alert? No  Use of a cane, walker or w/c? No  Grab bars in the bathroom? No  Shower chair or bench in shower? No  Elevated toilet seat or a handicapped toilet? Yes-elevated toilet  TIMED UP AND GO:  Was the test performed? Yes .  Length of time to ambulate 10 feet: 11 sec.   Gait steady and fast without use of assistive device  Cognitive Function:Normal cognitive status assessed by direct observation by this Nurse Health Advisor. No abnormalities found.          Immunizations Immunization History  Administered Date(s) Administered   Fluad Quad(high Dose 65+) 08/07/2020, 08/09/2021   Hepatitis B, adult 09/26/2015, 10/31/2015   Influenza Split 08/17/2012   Influenza,inj,Quad PF,6+ Mos 08/11/2013, 07/28/2014, 08/06/2015, 09/29/2016, 07/24/2017, 07/23/2018, 07/26/2019   Moderna SARS-COV2 Booster Vaccination 12/22/2019   Moderna Sars-Covid-2 Vaccination 09/07/2020, 03/11/2021   PFIZER(Purple Top)SARS-COV-2 Vaccination 12/23/2019, 01/13/2020   PNEUMOCOCCAL CONJUGATE-20 08/19/2021   PPD Test 09/26/2015, 10/31/2015, 11/30/2015   Pneumococcal Polysaccharide-23 08/12/2013, 02/14/2020   Tdap 12/22/2012, 12/04/2021   Zoster Recombinat (Shingrix) 05/16/2018, 07/23/2018, 07/26/2019   Zoster, Live 12/14/2014    TDAP status: Up to date  Flu Vaccine status: Up to date  Pneumococcal vaccine status: Up to date  Covid-19 vaccine status:  Completed vaccines  Qualifies for Shingles Vaccine? No   Zostavax completed  Yes   Shingrix Completed?: Yes  Screening Tests Health Maintenance  Topic Date Due   COVID-19 Vaccine (5 - Booster for Pfizer series) 05/06/2021   MAMMOGRAM  08/07/2023   Fecal DNA (Cologuard)  11/03/2023   TETANUS/TDAP  12/05/2031   Pneumonia Vaccine 66+ Years old  Completed   INFLUENZA VACCINE  Completed   DEXA SCAN  Completed   Hepatitis C Screening  Completed   Zoster Vaccines- Shingrix  Completed   HPV VACCINES  Aged Out   COLONOSCOPY (Pts 45-65yrs Insurance coverage will need to be confirmed)  Discontinued    Health Maintenance  Health Maintenance Due  Topic Date Due   COVID-19 Vaccine (5 - Booster for [place] series) 05/06/2021    Colorectal cancer screening: Type of screening: Cologuard. Completed 11/02/2020. Repeat every 3 years  Mammogram status: Completed bilateral 08/06/2021. Repeat every year  Bone Density status: Declined  Lung Cancer Screening: (Low Dose CT Chest recommended if Age 22-80 years, 30 pack-year currently smoking OR have quit w/in 15years.) does not qualify.    Additional Screening:  Hepatitis C Screening: Completed 08/09/2021  Vision Screening: Recommended annual ophthalmology exams for early detection of glaucoma and other disorders of the eye. Is the patient up to date with their annual eye exam?  Yes  Who is the provider or what is the name of the office in which the patient attends annual eye exams? [place] Screening: Recommended annual dental exams for proper oral hygiene  Community Resource Referral / Chronic Care Management: CRR required this visit?  No   CCM required this visit?  No      Plan:     I have personally reviewed and noted the following in the patients chart:   Medical and social history Use of alcohol, tobacco or illicit drugs  Current medications and supplements including opioid prescriptions. Patient is not  currently taking opioid prescriptions. Functional ability and status Nutritional status Physical activity Advanced directives List of other physicians Hospitalizations, surgeries, and ER visits in previous 12 months Vitals Screenings to include cognitive, depression, and falls Referrals and appointments  In addition, I have reviewed and discussed with patient certain preventive protocols, quality metrics, and best practice recommendations. A written personalized care plan for preventive services as well as general preventive health recommendations were provided to patient.   Patient would like to access avs on mychart.  Marta Antu, LPN   06/24/4165  Nurse Health Advisor  Nurse Notes: None

## 2021-12-23 ENCOUNTER — Telehealth: Payer: Self-pay

## 2021-12-23 ENCOUNTER — Ambulatory Visit (INDEPENDENT_AMBULATORY_CARE_PROVIDER_SITE_OTHER): Payer: Medicare Other

## 2021-12-23 VITALS — BP 113/73 | HR 58 | Temp 97.8°F | Resp 16 | Ht 63.0 in | Wt 142.0 lb

## 2021-12-23 DIAGNOSIS — Z0000 Encounter for general adult medical examination without abnormal findings: Secondary | ICD-10-CM | POA: Diagnosis not present

## 2021-12-23 NOTE — Telephone Encounter (Signed)
Patient states every time she gets her Metoprolol refilled they only give her 16 tablets. She says she is suppose to be taking 1 tab everyday. She would like the prescription to be resent to the pharmacy to see if that will correct the problem.

## 2021-12-23 NOTE — Patient Instructions (Signed)
Melissa Grimes , Thank you for taking time to come for your Medicare Wellness Visit. I appreciate your ongoing commitment to your health goals. Please review the following plan we discussed and let me know if I can assist you in the future.   Screening recommendations/referrals: Colonoscopy: Cologuard completed-11/02/2020-Due 11/03/2023 Mammogram: Completed 08/06/2021-Due 08/06/2022 Bone Density: Declined. Recommended yearly ophthalmology/optometry visit for glaucoma screening and checkup Recommended yearly dental visit for hygiene and checkup  Vaccinations: Influenza vaccine: Up to date Pneumococcal vaccine: Up to date Tdap vaccine: Up to date Shingles vaccine: Completed vaccines   Covid-19:Up to date  Advanced directives: Please bring a copy of Living Will and/or Healthcare Power of Attorney for your chart.   Conditions/risks identified: See problem list  Next appointment: Follow up in one year for your annual wellness visit 12/29/2022 @ 8:20   Preventive Care 65 Years and Older, Female Preventive care refers to lifestyle choices and visits with your health care provider that can promote health and wellness. What does preventive care include? A yearly physical exam. This is also called an annual well check. Dental exams once or twice a year. Routine eye exams. Ask your health care provider how often you should have your eyes checked. Personal lifestyle choices, including: Daily care of your teeth and gums. Regular physical activity. Eating a healthy diet. Avoiding tobacco and drug use. Limiting alcohol use. Practicing safe sex. Taking low-dose aspirin every day. Taking vitamin and mineral supplements as recommended by your health care provider. What happens during an annual well check? The services and screenings done by your health care provider during your annual well check will depend on your age, overall health, lifestyle risk factors, and family history of  disease. Counseling  Your health care provider may ask you questions about your: Alcohol use. Tobacco use. Drug use. Emotional well-being. Home and relationship well-being. Sexual activity. Eating habits. History of falls. Memory and ability to understand (cognition). Work and work Statistician. Reproductive health. Screening  You may have the following tests or measurements: Height, weight, and BMI. Blood pressure. Lipid and cholesterol levels. These may be checked every 5 years, or more frequently if you are over 11 years old. Skin check. Lung cancer screening. You may have this screening every year starting at age 42 if you have a 30-pack-year history of smoking and currently smoke or have quit within the past 15 years. Fecal occult blood test (FOBT) of the stool. You may have this test every year starting at age 45. Flexible sigmoidoscopy or colonoscopy. You may have a sigmoidoscopy every 5 years or a colonoscopy every 10 years starting at age 37. Hepatitis C blood test. Hepatitis B blood test. Sexually transmitted disease (STD) testing. Diabetes screening. This is done by checking your blood sugar (glucose) after you have not eaten for a while (fasting). You may have this done every 1-3 years. Bone density scan. This is done to screen for osteoporosis. You may have this done starting at age 32. Mammogram. This may be done every 1-2 years. Talk to your health care provider about how often you should have regular mammograms. Talk with your health care provider about your test results, treatment options, and if necessary, the need for more tests. Vaccines  Your health care provider may recommend certain vaccines, such as: Influenza vaccine. This is recommended every year. Tetanus, diphtheria, and acellular pertussis (Tdap, Td) vaccine. You may need a Td booster every 10 years. Zoster vaccine. You may need this after age 46. Pneumococcal 13-valent conjugate (  PCV13) vaccine. One  dose is recommended after age 42. Pneumococcal polysaccharide (PPSV23) vaccine. One dose is recommended after age 92. Talk to your health care provider about which screenings and vaccines you need and how often you need them. This information is not intended to replace advice given to you by your health care provider. Make sure you discuss any questions you have with your health care provider. Document Released: 11/30/2015 Document Revised: 07/23/2016 Document Reviewed: 09/04/2015 Elsevier Interactive Patient Education  2017 [place] Prevention in the Home Falls can cause injuries. They can happen to people of all ages. There are many things you can do to make your home safe and to help prevent falls. What can I do on the outside of my home? Regularly fix the edges of walkways and driveways and fix any cracks. Remove anything that might make you trip as you walk through a door, such as a raised step or threshold. Trim any bushes or trees on the path to your home. Use bright outdoor lighting. Clear any walking paths of anything that might make someone trip, such as rocks or tools. Regularly check to see if handrails are loose or broken. Make sure that both sides of any steps have handrails. Any raised decks and porches should have guardrails on the edges. Have any leaves, snow, or ice cleared regularly. Use sand or salt on walking paths during winter. Clean up any spills in your garage right away. This includes oil or grease spills. What can I do in the bathroom? Use night lights. Install grab bars by the toilet and in the tub and shower. Do not use towel bars as grab bars. Use non-skid mats or decals in the tub or shower. If you need to sit down in the shower, use a plastic, non-slip stool. Keep the floor dry. Clean up any water that spills on the floor as soon as it happens. Remove soap buildup in the tub or shower regularly. Attach bath mats securely with double-sided  non-slip rug tape. Do not have throw rugs and other things on the floor that can make you trip. What can I do in the bedroom? Use night lights. Make sure that you have a light by your bed that is easy to reach. Do not use any sheets or blankets that are too big for your bed. They should not hang down onto the floor. Have a firm chair that has side arms. You can use this for support while you get dressed. Do not have throw rugs and other things on the floor that can make you trip. What can I do in the kitchen? Clean up any spills right away. Avoid walking on wet floors. Keep items that you use a lot in easy-to-reach places. If you need to reach something above you, use a strong step stool that has a grab bar. Keep electrical cords out of the way. Do not use floor polish or wax that makes floors slippery. If you must use wax, use non-skid floor wax. Do not have throw rugs and other things on the floor that can make you trip. What can I do with my stairs? Do not leave any items on the stairs. Make sure that there are handrails on both sides of the stairs and use them. Fix handrails that are broken or loose. Make sure that handrails are as long as the stairways. Check any carpeting to make sure that it is firmly attached to the stairs. Fix any carpet that is  loose or worn. Avoid having throw rugs at the top or bottom of the stairs. If you do have throw rugs, attach them to the floor with carpet tape. Make sure that you have a light switch at the top of the stairs and the bottom of the stairs. If you do not have them, ask someone to add them for you. What else can I do to help prevent falls? Wear shoes that: Do not have high heels. Have rubber bottoms. Are comfortable and fit you well. Are closed at the toe. Do not wear sandals. If you use a stepladder: Make sure that it is fully opened. Do not climb a closed stepladder. Make sure that both sides of the stepladder are locked into place. Ask  someone to hold it for you, if possible. Clearly mark and make sure that you can see: Any grab bars or handrails. First and last steps. Where the edge of each step is. Use tools that help you move around (mobility aids) if they are needed. These include: Canes. Walkers. Scooters. Crutches. Turn on the lights when you go into a dark area. Replace any light bulbs as soon as they burn out. Set up your furniture so you have a clear path. Avoid moving your furniture around. If any of your floors are uneven, fix them. If there are any pets around you, be aware of where they are. Review your medicines with your doctor. Some medicines can make you feel dizzy. This can increase your chance of falling. Ask your doctor what other things that you can do to help prevent falls. This information is not intended to replace advice given to you by your health care provider. Make sure you discuss any questions you have with your health care provider. Document Released: 08/30/2009 Document Revised: 04/10/2016 Document Reviewed: 12/08/2014 Elsevier Interactive Patient Education  2017 [employer].

## 2021-12-24 NOTE — Telephone Encounter (Signed)
Talked to pharmacist and he explained at one point she used a discount card and got a partial refill of 16 tablets. Their system seems to be stock on that information and will supply 16 tablets only every time. He said the patient needs to tell the pharmacy she wants the whole 90 tablets when she comes in. I advised him we sent a new rx and telling them verbally she needs to get 24. He replied he is not able to remove the 16 pill dispense and patient will need to tell them every time.  Called patient to inform of this but no answer, lvm for her to call me back

## 2021-12-24 NOTE — Telephone Encounter (Signed)
I sent 90 tablets to her pharmacy last time. Can you please call her pharmacy to see what the story is?

## 2021-12-25 NOTE — Telephone Encounter (Signed)
Patient called back and has been advised

## 2022-01-19 ENCOUNTER — Encounter: Payer: Self-pay | Admitting: Family

## 2022-01-20 ENCOUNTER — Encounter: Payer: Self-pay | Admitting: Family

## 2022-01-20 ENCOUNTER — Telehealth (INDEPENDENT_AMBULATORY_CARE_PROVIDER_SITE_OTHER): Payer: Medicare Other | Admitting: Family

## 2022-01-20 DIAGNOSIS — U071 COVID-19: Secondary | ICD-10-CM | POA: Diagnosis not present

## 2022-01-20 NOTE — Telephone Encounter (Signed)
Please contact pt to schedule a virtual visit today.  ?

## 2022-01-20 NOTE — Progress Notes (Signed)
MyChart Video Visit    Virtual Visit via Video Note   This visit type was conducted due to national recommendations for restrictions regarding the COVID-19 Pandemic (e.g. social distancing) in an effort to limit this patient's exposure and mitigate transmission in our community. This patient is at least at moderate risk for complications without adequate follow up. This format is felt to be most appropriate for this patient at this time. Physical exam was limited by quality of the video and audio technology used for the visit. Lorrin Goodell. was able to get the patient set up on a video visit.  Patient location: Home Patient and provider in visit Provider location: Office  I discussed the limitations of evaluation and management by telemedicine and the availability of in person appointments. The patient expressed understanding and agreed to proceed.  Visit Date: 01/20/2022  Today's healthcare provider: Nance Pear, NP    Subjective:    Patient ID: Melissa Grimes, female    DOB: [birth year], 66 y.o.   MRN: [id number]  Chief Complaint  Patient presents with   Covid Positive    Patient tested positive for covid yesterday. Retest today and "is negative"    HPI Patient is in today for a video visit.  Covid-19- Her symptoms started last Wednesday while she was on vacation.. She tested positive for Covid-19 yesterday . She re-tested this morning and it was negative. Her sore throat is almost completely resolved. She was experiencing rhinorrhea and GERD when it first started but they are mostly resolved.  Past Medical History:  Diagnosis Date   Arthritis    Complication of anesthesia    difficulty urinating after   Hyperlipidemia    Hypertension     Past Surgical History:  Procedure Laterality Date   BUNIONECTOMY     CESAREAN SECTION     x 2   HERNIA REPAIR Left 1977   inguinal   [place] ARTHROPLASTY Right 01/18/2013   Procedure: Right TOTAL  HIP ARTHROPLASTY ANTERIOR APPROACH;  Surgeon: Mcarthur Rossetti, MD;  Location: [facility];  Service: Orthopedics;  Laterality: Right;   TOTAL HIP ARTHROPLASTY Left 08/09/2013   Procedure: LEFT TOTAL HIP ARTHROPLASTY ANTERIOR APPROACH;  Surgeon: Mcarthur Rossetti, MD;  Location: [facility];  Service: Orthopedics;  Laterality: Left;    Family History  Problem Relation Age of Onset   Arthritis Mother        osteoarthritis   Hyperlipidemia Mother    Heart disease Mother    Hypertension Mother    Hypothyroidism Mother    Hyperlipidemia Father    Heart disease Father    Stroke Father    Parkinson's disease Father    Cancer Cousin        breast   Cancer Maternal Grandfather        lung   Obesity Sister        "overweight"   Hypertension Sister    Varicose Veins Child    Asthma Child    Migraines Child     Social History   Socioeconomic History   Marital status: Married    Spouse name: Not on file   Number of children: 2   Years of education: Not on file   Highest education level: Not on file  Occupational History   Not on file  Tobacco Use   Smoking status: Never   Smokeless tobacco: Never  Vaping Use   Vaping Use: Never used  Substance and Sexual Activity   Alcohol use: Yes    Alcohol/week: 3.0 standard drinks    Types: 3 Glasses of wine per week   Drug use: No   Sexual activity: Yes  Other Topics Concern   Not on file  Social History Narrative   Corporate treasurer   Completed 2 yrs college   Married- from [place]   2 children- grown both in [place]   Enjoys Adult nurse ed material   1 cat   Social Determinants of Radio broadcast assistant Strain: Low Risk    Difficulty of Paying Living Expenses: Not hard at all  Food Insecurity: No Food Insecurity   Worried About Charity fundraiser in the Last Year: Never true   Arboriculturist in the Last Year: Never true  Transportation Needs: No Transportation Needs   Lack of Transportation (Medical): No    Lack of Transportation (Non-Medical): No  Physical Activity: Sufficiently Active   Days of Exercise per Week: 2 days   Minutes of Exercise per Session: 120 min  Stress: No Stress Concern Present   Feeling of Stress : Not at all  Social Connections: Socially Integrated   Frequency of Communication with Friends and Family: Once a week   Frequency of Social Gatherings with Friends and Family: More than three times a week   Attends Religious Services: More than 4 times per year   Active Member of [employer] or Organizations: Yes   Attends Music therapist: More than 4 times per year   Marital Status: Married  Human resources officer Violence: Not At Risk   Fear of Current or Ex-Partner: No   Emotionally Abused: No   Physically Abused: No   Sexually Abused: No    Outpatient Medications Prior to Visit  Medication Sig Dispense Refill   aspirin EC 81 MG tablet Take 81 mg by mouth daily.     atorvastatin (LIPITOR) 20 MG tablet Take 1 tablet (20 mg total) by mouth daily. 90 tablet 0   Calcium Citrate-Vitamin D 500-400 MG-UNIT CHEW Chew 1 tablet by mouth 2 (two) times daily.      emtricitabine-tenofovir (TRUVADA) 200-300 MG tablet Take 1 tablet by mouth daily. 90 tablet 0   lisinopril (ZESTRIL) 30 MG tablet Take 1 tablet (30 mg total) by mouth daily. 90 tablet 1   metoprolol succinate (TOPROL-XL) 25 MG 24 hr tablet Take 1 tablet (25 mg total) by mouth daily. 90 tablet 1   Multiple Vitamin (MULTIVITAMIN) tablet Take 1 tablet by mouth daily.     Multiple Vitamins-Minerals (PRESERVISION AREDS PO) Take by mouth.     Omega-3 Fatty Acids (FISH OIL) 1000 MG CAPS Take 1 capsule by mouth daily.     Ascorbic Acid (VITAMIN C) 500 MG CAPS Take by mouth.     VITAMIN D, CHOLECALCIFEROL, PO Take by mouth.     No facility-administered medications prior to visit.    Allergies  Allergen Reactions   Achromycin [Tetracycline] Hives    Review of Systems  Constitutional:  Negative for fever and  malaise/fatigue.  HENT:  Positive for sore throat. Negative for congestion.        (+) rhinorrhea   Respiratory:  Negative for cough and shortness of breath.   Musculoskeletal:  Negative for myalgias.      Objective:    Physical Exam Constitutional:      Appearance: Normal appearance. She is not ill-appearing.  Neurological:     Mental Status: She is alert  and oriented to person, place, and time.  Psychiatric:        Mood and Affect: Mood normal.        Behavior: Behavior normal.        Judgment: Judgment normal.    LMP 11/17/2010  Wt Readings from Last 3 Encounters:  12/23/21 142 lb (64.4 kg)  10/22/21 145 lb 12.8 oz (66.1 kg)  08/09/21 143 lb 9.6 oz (65.1 kg)    Diabetic Foot Exam - Simple   No data filed    Lab Results  Component Value Date   WBC 6.3 10/22/2021   HGB 13.2 10/22/2021   HCT 39.7 10/22/2021   PLT 297.0 10/22/2021   GLUCOSE 85 10/22/2021   CHOL 168 02/05/2021   TRIG 98.0 02/05/2021   HDL 63.70 02/05/2021   LDLDIRECT 102.0 08/06/2015   LDLCALC 85 02/05/2021   ALT 27 10/22/2021   AST 24 10/22/2021   NA 141 10/22/2021   K 4.6 10/22/2021   CL 104 10/22/2021   CREATININE 0.75 10/22/2021   BUN 23 10/22/2021   CO2 30 10/22/2021   TSH 1.20 02/14/2020   INR 0.93 08/14/2013   HGBA1C 5.5 04/07/2018    Lab Results  Component Value Date   TSH 1.20 02/14/2020   Lab Results  Component Value Date   WBC 6.3 10/22/2021   HGB 13.2 10/22/2021   HCT 39.7 10/22/2021   MCV 97.4 10/22/2021   PLT 297.0 10/22/2021   Lab Results  Component Value Date   NA 141 10/22/2021   K 4.6 10/22/2021   CO2 30 10/22/2021   GLUCOSE 85 10/22/2021   BUN 23 10/22/2021   CREATININE 0.75 10/22/2021   BILITOT 0.4 10/22/2021   ALKPHOS 79 10/22/2021   AST 24 10/22/2021   ALT 27 10/22/2021   PROT 6.6 10/22/2021   ALBUMIN 4.3 10/22/2021   CALCIUM 9.5 10/22/2021   ANIONGAP 12 10/18/2018   GFR 82.64 10/22/2021   Lab Results  Component Value Date   CHOL 168  02/05/2021   Lab Results  Component Value Date   HDL 63.70 02/05/2021   Lab Results  Component Value Date   LDLCALC 85 02/05/2021   Lab Results  Component Value Date   TRIG 98.0 02/05/2021   Lab Results  Component Value Date   CHOLHDL 3 02/05/2021   Lab Results  Component Value Date   HGBA1C 5.5 04/07/2018      Gen: Awake, alert, no acute distress Resp: Breathing is even and non-labored Psych: calm/pleasant demeanor Neuro: Alert and Oriented x 3, + facial symmetry, speech is clear.  Assessment & Plan:   Problem List Items Addressed This Visit   None    No orders of the defined types were placed in this encounter.   I discussed the assessment and treatment plan with the patient. The patient was provided an opportunity to ask questions and all were answered. The patient agreed with the plan and demonstrated an understanding of the instructions.   The patient was advised to call back or seek an in-person evaluation if the symptoms worsen or if the condition fails to improve as anticipated.  I provided 10 minutes of face-to-face time during this encounter.   I,Zite Okoli,acting as a Education administrator for [employer], NP.,have documented all relevant documentation on the behalf of Nance Pear, NP,as directed by  Nance Pear, NP while in the presence of Nance Pear, NP.   Nance Pear, NP [facility]  [place] [phone] (phone) [phone] (fax)  [place]

## 2022-01-20 NOTE — Telephone Encounter (Signed)
Scheduled for virtual visit today ?

## 2022-01-20 NOTE — Assessment & Plan Note (Signed)
Patient is outside of the window for antiviral treatment. Advised of CDC guidelines for self isolation/ ending isolation.  Advised of safe practice guidelines. Symptom Tier reviewed.  Encouraged to monitor for any worsening symptoms; watch for increased shortness of breath, weakness, and signs of dehydration. Advised when to seek emergency care.  Instructed to rest and hydrate well.  Advised to leave the house during recommended isolation period, only if it is necessary to seek medical care ? ?

## 2022-01-21 ENCOUNTER — Ambulatory Visit: Payer: Medicare Other | Admitting: Family

## 2022-01-29 ENCOUNTER — Encounter: Payer: Self-pay | Admitting: General Practice

## 2022-02-10 ENCOUNTER — Other Ambulatory Visit (HOSPITAL_COMMUNITY)
Admission: RE | Admit: 2022-02-10 | Discharge: 2022-02-10 | Disposition: A | Discharge status: Home / Self Care | Payer: Medicare Other | Source: Ambulatory Visit | Attending: Family | Admitting: Family

## 2022-02-10 ENCOUNTER — Ambulatory Visit (INDEPENDENT_AMBULATORY_CARE_PROVIDER_SITE_OTHER): Payer: Medicare Other | Admitting: Family

## 2022-02-10 ENCOUNTER — Encounter: Payer: Self-pay | Admitting: Family

## 2022-02-10 VITALS — BP 112/75 | HR 54 | Temp 97.4°F | Resp 16 | Ht 63.0 in | Wt 137.0 lb

## 2022-02-10 DIAGNOSIS — E042 Nontoxic multinodular goiter: Secondary | ICD-10-CM | POA: Diagnosis not present

## 2022-02-10 DIAGNOSIS — Z113 Encounter for screening for infections with a predominantly sexual mode of transmission: Secondary | ICD-10-CM

## 2022-02-10 DIAGNOSIS — Z7251 High risk heterosexual behavior: Secondary | ICD-10-CM | POA: Diagnosis not present

## 2022-02-10 DIAGNOSIS — E785 Hyperlipidemia, unspecified: Secondary | ICD-10-CM | POA: Diagnosis not present

## 2022-02-10 DIAGNOSIS — Z0000 Encounter for general adult medical examination without abnormal findings: Secondary | ICD-10-CM | POA: Diagnosis not present

## 2022-02-10 DIAGNOSIS — I10 Essential (primary) hypertension: Secondary | ICD-10-CM | POA: Diagnosis not present

## 2022-02-10 LAB — LIPID PANEL
Cholesterol: 138 mg/dL (ref 0–200)
HDL: 53.6 mg/dL (ref >39.00)
LDL Cholesterol: 65 mg/dL (ref 0–99)
NonHDL: 84.23
Total CHOL/HDL Ratio: 3
Triglycerides: 94 mg/dL (ref 0.0–149.0)
VLDL: 18.8 mg/dL (ref 0.0–40.0)

## 2022-02-10 LAB — COMPREHENSIVE METABOLIC PANEL
ALT: 22 U/L (ref 0–35)
AST: 20 U/L (ref 0–37)
Albumin: 4.5 g/dL (ref 3.5–5.2)
Alkaline Phosphatase: 76 U/L (ref 39–117)
BUN: 18 mg/dL (ref 6–23)
CO2: 30 mEq/L (ref 19–32)
Calcium: 9.7 mg/dL (ref 8.4–10.5)
Chloride: 103 mEq/L (ref 96–112)
Creatinine, Ser: 0.86 mg/dL (ref 0.40–1.20)
GFR: 69.98 mL/min (ref >60.00)
Glucose, Bld: 96 mg/dL (ref 70–99)
Potassium: 4.3 mEq/L (ref 3.5–5.1)
Sodium: 140 mEq/L (ref 135–145)
Total Bilirubin: 0.6 mg/dL (ref 0.2–1.2)
Total Protein: 6.7 g/dL (ref 6.0–8.3)

## 2022-02-10 NOTE — Assessment & Plan Note (Addendum)
Check HIV testing.  Continue Truvada. STD testing as below.  ?

## 2022-02-10 NOTE — Assessment & Plan Note (Signed)
BP Readings from Last 3 Encounters:  ?02/10/22 112/75  ?12/23/21 113/73  ?10/22/21 119/79  ? ?BP stable. Continue lisinopril.  ?

## 2022-02-10 NOTE — Assessment & Plan Note (Signed)
She would like to try coming off of statin to see how she does.  Check lipid panel today. Can repeat at her 3 month follow up off of the statin.  ?

## 2022-02-10 NOTE — Assessment & Plan Note (Addendum)
Continue healthy diet and regular exercise.  Labs as ordered.  Mammo up to date. Pap up to date- management per GYN.   ?

## 2022-02-10 NOTE — Assessment & Plan Note (Signed)
>>  ASSESSMENT AND PLAN FOR DYSLIPIDEMIA WRITTEN ON 02/10/2022  7:38 AM BY O'SULLIVAN, Yvett Rossel, NP  She would like to try coming off of statin to see how she does.  Check lipid panel today. Can repeat at her 3 month follow up off of the statin.

## 2022-02-10 NOTE — Progress Notes (Signed)
? ?Subjective:  ? ?By signing my name below, I, Shehryar Baig, attest that this documentation has been prepared under the direction and in the presence of [name], NP. 02/10/2022 ? ? ? Patient ID: Melissa Grimes, female    DOB: 01/16/1956, 66 y.o.   MRN: [id number] ? ?Chief Complaint  ?Patient presents with  ? Annual Exam  ? ? ?HPI ?Patient is in today for a comprehensive physical exam. ? ?Cholesterol- She continues taking 20 mg atorvastatin daily PO and reports no new issues while taking it. She is requesting to stop taking it if her cholesterol levels improves during her next lab work. Her reason for wanting to stops is her mothers cognitive levels declined while on it.  ?Lab Results  ?Component Value Date  ? CHOL 168 02/05/2021  ? HDL 63.70 02/05/2021  ? [place] 85 02/05/2021  ? LDLDIRECT 102.0 08/06/2015  ? TRIG 98.0 02/05/2021  ? CHOLHDL 3 02/05/2021  ? ?She denies having any fever, new muscle pain, joint pain, new moles, congestion, sinus pain, sore throat, chest pain, palpations, cough, SOB, wheezing, n/v/d, constipation, blood in stool, dysuria, frequency, weight change, adenopathy, hematuria, depression, anxiety, headaches at this time. ?Social history: She reports having her right 2nd toe removed last year on January, 2022. Otherwise she has no recent surgical procedures. She has no changes to her family medical history. She occasionally drinks wine. She does not use drugs. She does not use tobacco or vaping products.  ?Immunizations: She is UTD on flu vaccines. She is UTD on pneumonia vaccines. She is UTD on both shingles vaccines. She has 4 Covid-19 vaccines. She is eligible for the bivalent Covid-19 vaccine and was informed of it.  ?Diet: She is managing a healthy diet at this time.  ?Exercise: ?Colonoscopy: Last completed cologuard 11/02/2020.  ?Dexa: Last completed 04/18/2019. Results are normal.  ?Pap Smear: Last completed 03/15/2021. Results are normal. Repeat in 3 years.  ?Mammogram:  Last completed 08/06/2021. Results are normal. Repeat in 1 year. ?Dental: She has an [facility] appointment. She reports having a chipped tooth and is planning on having her dentist manage her issue.  ?Vision: She is UTD on vision care.  ? ? ?Health Maintenance Due  ?Topic Date Due  ? COVID-19 Vaccine (5 - Booster for Pfizer series) 05/06/2021  ? ? ?Past Medical History:  ?Diagnosis Date  ? Arthritis   ? Complication of anesthesia   ? difficulty urinating after  ? Hyperlipidemia   ? Hypertension   ? ? ?Past Surgical History:  ?Procedure Laterality Date  ? BUNIONECTOMY    ? CESAREAN SECTION    ? x 2  ? HERNIA REPAIR Left 11/18/1975  ? inguinal  ? [place]  ? toe removal Right 12/08/2021  ? right second toe  ? TOTAL HIP ARTHROPLASTY Right 01/18/2013  ? Procedure: Right TOTAL HIP ARTHROPLASTY ANTERIOR APPROACH;  Surgeon: Mcarthur Rossetti, MD;  Location: [facility];  Service: Orthopedics;  Laterality: Right;  ? TOTAL HIP ARTHROPLASTY Left 08/09/2013  ? Procedure: LEFT TOTAL HIP ARTHROPLASTY ANTERIOR APPROACH;  Surgeon: Mcarthur Rossetti, MD;  Location: [facility];  Service: Orthopedics;  Laterality: Left;  ? ? ?Family History  ?Problem Relation Age of Onset  ? Arthritis Mother   ?     osteoarthritis  ? Hyperlipidemia Mother   ? Heart disease Mother   ? Hypertension Mother   ? Hypothyroidism Mother   ? Hyperlipidemia Father   ? Heart disease Father   ?  Stroke Father   ? Parkinson's disease Father   ? Cancer Cousin   ?     breast  ? Cancer Maternal Grandfather   ?     lung  ? Obesity Sister   ?     "overweight"  ? Hypertension Sister   ? Varicose Veins Child   ? Asthma Child   ? Migraines Child   ? ? ?Social History  ? ?Socioeconomic History  ? Marital status: Married  ?  Spouse name: Not on file  ? Number of children: 2  ? Years of education: Not on file  ? Highest education level: Not on file  ?Occupational History  ? Not on file  ?Tobacco Use  ? Smoking status: Never  ? Smokeless tobacco: Never   ?Vaping Use  ? Vaping Use: Never used  ?Substance and Sexual Activity  ? Alcohol use: Yes  ?  Alcohol/week: 3.0 standard drinks  ?  Types: 3 Glasses of wine per week  ? Drug use: No  ? Sexual activity: Yes  ?Other Topics Concern  ? Not on file  ?Social History Narrative  ? 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 Married- from [place]  ?  children- grown both in [place]  ? Enjoys writing christian ed material1 cat  ? ?Social Determinants of Health  ? ?Financial Resource Strain: Low Risk   ? Difficulty of Paying Living Expenses: Not hard at all  ?Food Insecurity: No Food Insecurity  ? Worried About Charity fundraiser in the Last Year: Never true  ? Ran Out of Food in the Last Year: Never true  ?Transportation Needs: No Transportation Needs  ? Lack of Transportation (Medical): No  ? Lack of Transportation (Non-Medical): No  ?Physical Activity: Sufficiently Active  ? Days of Exercise per Week: 2 days  ? Minutes of Exercise per Session: 120 min  ?Stress: No Stress Concern Present  ? Feeling of Stress : Not at all  ?Social Connections: Socially Integrated  ? Frequency of Communication with Friends and Family: Once a week  ? Frequency of Social Gatherings with Friends and Family: More than three times a week  ? Attends Religious Services: More than 4 times per year  ? Active Member of Clubs or Organizations: Yes  ? Attends Archivist Meetings: More than 4 times per year  ? Marital Status: Married  ?Intimate Partner Violence: Not At Risk  ? Fear of Current or Ex-Partner: No  ? Emotionally Abused: No  ? Physically Abused: No  ? Sexually Abused: No  ? ? ?Outpatient Medications Prior to Visit  ?Medication Sig Dispense Refill  ? aspirin EC 81 MG tablet Take 81 mg by mouth daily.    ? atorvastatin (LIPITOR) 20 MG tablet Take 1 tablet (20 mg total) by mouth daily. 90 tablet 0  ? Calcium Citrate-Vitamin D 500-400 MG-UNIT CHEW Chew 1 tablet by mouth 2 (two) times daily.     ? emtricitabine-tenofovir (TRUVADA)  200-300 MG tablet Take 1 tablet by mouth daily. 90 tablet 0  ? lisinopril (ZESTRIL) 30 MG tablet Take 1 tablet (30 mg total) by mouth daily. 90 tablet 1  ? metoprolol succinate (TOPROL-XL) 25 MG 24 hr tablet Take 1 tablet (25 mg total) by mouth daily. 90 tablet 1  ? Multiple Vitamin (MULTIVITAMIN) tablet Take 1 tablet by mouth daily.    ? Multiple Vitamins-Minerals (PRESERVISION AREDS PO) Take by mouth.    ? Omega-3 Fatty Acids (FISH OIL) 1000 MG CAPS  Take 1 capsule by mouth daily.    ? ?No facility-administered medications prior to visit.  ? ? ?Allergies  ?Allergen Reactions  ? Achromycin [Tetracycline] Hives  ? ? ?Review of Systems  ?Constitutional:  Negative for fever.  ?     (-)unexpected weight change ?(-)Adenopathy  ?HENT:  Negative for congestion, hearing loss, sinus pain and sore throat.   ?     (-)Rhinorrhea   ?Eyes:   ?     (-)Visual disturbance  ?Respiratory:  Negative for cough, shortness of breath and wheezing.   ?Cardiovascular:  Negative for chest pain, palpitations and leg swelling.  ?Gastrointestinal:  Negative for blood in stool, constipation, diarrhea, nausea and vomiting.  ?Genitourinary:  Negative for dysuria, frequency and hematuria.  ?Musculoskeletal:  Negative for joint pain and myalgias.  ?Skin:   ?     (-)new moles  ?Neurological:  Negative for headaches.  ?Psychiatric/Behavioral:  Negative for depression. The patient is not nervous/anxious.   ? ?   ?Objective:  ?  ?Physical Exam ?Constitutional:   ?   General: She is not in acute distress. ?   Appearance: Normal appearance. She is not ill-appearing.  ?HENT:  ?   Head: Normocephalic and atraumatic.  ?   Right Ear: Tympanic membrane, ear canal and external ear normal.  ?   Left Ear: Tympanic membrane, ear canal and external ear normal.  ?Eyes:  ?   Extraocular Movements: Extraocular movements intact.  ?   Right eye: No nystagmus.  ?   Left eye: No nystagmus.  ?   Pupils: Pupils are equal, round, and reactive to light.  ?Neck:  ?   Thyroid:  No thyroid tenderness.  ?Cardiovascular:  ?   Rate and Rhythm: Normal rate and regular rhythm.  ?   Heart sounds: Normal heart sounds. No murmur heard. ?  No gallop.  ?Pulmonary:  ?   Effort: Pulmonary eff

## 2022-02-10 NOTE — Addendum Note (Signed)
Addended by: Kelle Darting A on: 02/10/2022 07:47 AM ? ? Modules accepted: Orders ? ?

## 2022-02-11 LAB — CERVICOVAGINAL ANCILLARY ONLY
Chlamydia: NEGATIVE
Comment: NEGATIVE
Comment: NEGATIVE
Comment: NORMAL
Neisseria Gonorrhea: NEGATIVE
Trichomonas: NEGATIVE

## 2022-02-11 LAB — RPR: RPR Ser Ql: NONREACTIVE

## 2022-02-11 LAB — TSH: TSH: 2.37 u[IU]/mL (ref 0.35–5.50)

## 2022-02-18 LAB — HEPB+HEPC+HIV PANEL
HIV Screen 4th Generation wRfx: NONREACTIVE
Hep B C IgM: NEGATIVE
Hep B Core Total Ab: NEGATIVE
Hep B E Ab: NEGATIVE
Hep B E Ag: NEGATIVE
Hep B Surface Ab, Qual: NONREACTIVE
Hep C Virus Ab: <0.1
Hepatitis B Surface Ag: NEGATIVE

## 2022-03-04 ENCOUNTER — Other Ambulatory Visit: Payer: Self-pay | Admitting: Family

## 2022-03-07 ENCOUNTER — Other Ambulatory Visit (HOSPITAL_COMMUNITY)
Admission: RE | Admit: 2022-03-07 | Discharge: 2022-03-07 | Disposition: A | Discharge status: Home / Self Care | Payer: Medicare Other | Source: Ambulatory Visit | Attending: Family Medicine | Admitting: Family Medicine

## 2022-03-07 ENCOUNTER — Ambulatory Visit (HOSPITAL_BASED_OUTPATIENT_CLINIC_OR_DEPARTMENT_OTHER)
Admission: RE | Admit: 2022-03-07 | Discharge: 2022-03-07 | Disposition: A | Discharge status: Home / Self Care | Payer: Medicare Other | Source: Ambulatory Visit | Attending: Family Medicine | Admitting: Family Medicine

## 2022-03-07 ENCOUNTER — Encounter: Payer: Self-pay | Admitting: Family Medicine

## 2022-03-07 ENCOUNTER — Ambulatory Visit: Payer: Medicare Other | Admitting: Family Medicine

## 2022-03-07 VITALS — BP 118/81 | HR 71 | Ht 63.0 in | Wt 135.0 lb

## 2022-03-07 DIAGNOSIS — N898 Other specified noninflammatory disorders of vagina: Secondary | ICD-10-CM

## 2022-03-07 DIAGNOSIS — Z7721 Contact with and (suspected) exposure to potentially hazardous body fluids: Secondary | ICD-10-CM

## 2022-03-07 DIAGNOSIS — Z780 Asymptomatic menopausal state: Secondary | ICD-10-CM | POA: Diagnosis not present

## 2022-03-07 NOTE — Progress Notes (Signed)
? ?  Subjective:  ? ? Patient ID: Melissa Grimes, female    DOB: 1956-08-28, 66 y.o.   MRN: [id number] ? ?HPI ? ?Patient seen for increased vaginal discharge.  This has been going on for a few weeks.  She has noted specks of blood in the discharge.  She describes the discharge as clear and watery for the most part, but occasionally thick and yellow.  No palliating or provoking factors.  She is sexually active and has had 6 sexual partners over the past several months.  She reports that all of her partners have been tested for STIs and have been negative. ? ?Review of Systems ? ?   ?Objective:  ? Physical Exam ?Vitals reviewed. Exam conducted with a chaperone present.  ?Constitutional:   ?   Appearance: Normal appearance.  ?Abdominal:  ?   Hernia: There is no hernia in the left inguinal area or right inguinal area.  ?Genitourinary: ?   Labia:     ?   Right: No rash or tenderness.     ?   Left: No rash or tenderness.   ?   Urethra: No prolapse.  ?   Cervix: Discharge (yellow discharge) and erythema present. No cervical motion tenderness, friability or cervical bleeding.  ?Lymphadenopathy:  ?   Lower Body: No right inguinal adenopathy. No left inguinal adenopathy.  ?Neurological:  ?   Mental Status: She is alert.  ? ? ?   ?Assessment & Plan:  ?1. Exposure to body fluid ?- Cervicovaginal ancillary only ?- Hepatitis C antibody ?- Hepatitis B surface antigen ?- HIV Antibody (routine testing w rflx) ?- RPR ? ?2. Vaginal discharge ?Likely infection in nature - will check [place], however to ensure normal endometrium, especially with the copious clear discharge. ?- US PELVIC COMPLETE WITH TRANSVAGINAL; Future ? ?

## 2022-03-08 LAB — RPR: RPR Ser Ql: NONREACTIVE

## 2022-03-08 LAB — HIV ANTIBODY (ROUTINE TESTING W REFLEX): HIV Screen 4th Generation wRfx: NONREACTIVE

## 2022-03-08 LAB — HEPATITIS C ANTIBODY: Hep C Virus Ab: NONREACTIVE

## 2022-03-12 LAB — CERVICOVAGINAL ANCILLARY ONLY
Bacterial Vaginitis (gardnerella): NEGATIVE
Candida Glabrata: NEGATIVE
Candida Vaginitis: NEGATIVE
Chlamydia: NEGATIVE
Comment: NEGATIVE
Comment: NEGATIVE
Comment: NEGATIVE
Comment: NEGATIVE
Comment: NEGATIVE
Comment: NORMAL
Neisseria Gonorrhea: NEGATIVE
Trichomonas: NEGATIVE

## 2022-03-19 ENCOUNTER — Ambulatory Visit (INDEPENDENT_AMBULATORY_CARE_PROVIDER_SITE_OTHER): Payer: Medicare Other | Admitting: Family

## 2022-03-19 VITALS — BP 129/70 | HR 69 | Temp 98.4°F | Resp 18 | Ht 63.0 in | Wt 134.0 lb

## 2022-03-19 DIAGNOSIS — L03011 Cellulitis of right finger: Secondary | ICD-10-CM | POA: Insufficient documentation

## 2022-03-19 DIAGNOSIS — L089 Local infection of the skin and subcutaneous tissue, unspecified: Secondary | ICD-10-CM | POA: Diagnosis not present

## 2022-03-19 MED ORDER — SULFAMETHOXAZOLE-TRIMETHOPRIM 800-160 MG PO TABS: 1.0000 | ORAL_TABLET | Freq: Two times a day (BID) | ORAL | 0 refills | Status: DC

## 2022-03-19 MED ORDER — CEFTRIAXONE SODIUM 1 G IJ SOLR
1.0000 g | Freq: Once | INTRAMUSCULAR | Status: AC
  Administered 2022-03-19: 1 g via INTRAMUSCULAR

## 2022-03-19 NOTE — Patient Instructions (Signed)
Please begin antibiotics twice daily.  ?Call if increased pain/swelling, redness.  ?

## 2022-03-19 NOTE — Progress Notes (Signed)
? ?Subjective:  ? ?By signing my name below, I, Joaquin Music, attest that this documentation has been prepared under the direction and in the presence of [name], NP 03/19/2022  ? ? ? Patient ID: Melissa Grimes, female    DOB: 05/19/1956, 66 y.o.   MRN: [id number] ? ?Chief Complaint  ?Patient presents with  ? Hand Pain  ?  Left hand , middle finger onset: 1 week   ? ? ?Hand Pain  ? ?Patient is in today for an office visit. ? ?Finger pain- She reports that the middle finger on her right hand started to hurt last week. It started out with just a small area of irritation by her cuticle.  she started to apply an ointment cream and wash it with dial soap. She started applying band-aids to it everyday and noticed it was very sore and tender. The color of the finger also changed after a few days. She only feels relief when she runs the finger under hot water.  ? ?Past Medical History:  ?Diagnosis Date  ? Arthritis   ? Complication of anesthesia   ? difficulty urinating after  ? Hyperlipidemia   ? Hypertension   ? ? ?Past Surgical History:  ?Procedure Laterality Date  ? BUNIONECTOMY    ? CESAREAN SECTION    ? x 2  ? HERNIA REPAIR Left 11/18/1975  ? inguinal  ? [place]  ? toe removal Right 12/08/2021  ? right second toe  ? TOTAL HIP ARTHROPLASTY Right 01/18/2013  ? Procedure: Right TOTAL HIP ARTHROPLASTY ANTERIOR APPROACH;  Surgeon: Mcarthur Rossetti, MD;  Location: [facility];  Service: Orthopedics;  Laterality: Right;  ? TOTAL HIP ARTHROPLASTY Left 08/09/2013  ? Procedure: LEFT TOTAL HIP ARTHROPLASTY ANTERIOR APPROACH;  Surgeon: Mcarthur Rossetti, MD;  Location: [facility];  Service: Orthopedics;  Laterality: Left;  ? ? ?Family History  ?Problem Relation Age of Onset  ? Arthritis Mother   ?     osteoarthritis  ? Hyperlipidemia Mother   ? Heart disease Mother   ? Hypertension Mother   ? Hypothyroidism Mother   ? Hyperlipidemia Father   ? Heart disease Father   ? Stroke Father   ? Parkinson's  disease Father   ? Cancer Cousin   ?     breast  ? Cancer Maternal Grandfather   ?     lung  ? Obesity Sister   ?     "overweight"  ? Hypertension Sister   ? Varicose Veins Child   ? Asthma Child   ? Migraines Child   ? ? ?Social History  ? ?Socioeconomic History  ? Marital status: Married  ?  Spouse name: Not on file  ? Number of children: 2  ? Years of education: Not on file  ? Highest education level: Not on file  ?Occupational History  ? Not on file  ?Tobacco Use  ? Smoking status: Never  ? Smokeless tobacco: Never  ?Vaping Use  ? Vaping Use: Never used  ?Substance and Sexual Activity  ? Alcohol use: Yes  ?  Alcohol/week: 3.0 standard drinks  ?  Types: 3 Glasses of wine per week  ? Drug use: No  ? Sexual activity: Yes  ?Other Topics Concern  ? Not on file  ?Social History Narrative  ? 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 Married- from [place]  ?  children- grown both in [place]  ? Enjoys writing christian ed material1 cat  ? ?  Social Determinants of Health  ? ?Financial Resource Strain: Low Risk   ? Difficulty of Paying Living Expenses: Not hard at all  ?Food Insecurity: No Food Insecurity  ? Worried About Charity fundraiser in the Last Year: Never true  ? Ran Out of Food in the Last Year: Never true  ?Transportation Needs: No Transportation Needs  ? Lack of Transportation (Medical): No  ? Lack of Transportation (Non-Medical): No  ?Physical Activity: Sufficiently Active  ? Days of Exercise per Week: 2 days  ? Minutes of Exercise per Session: 120 min  ?Stress: No Stress Concern Present  ? Feeling of Stress : Not at all  ?Social Connections: Socially Integrated  ? Frequency of Communication with Friends and Family: Once a week  ? Frequency of Social Gatherings with Friends and Family: More than three times a week  ? Attends Religious Services: More than 4 times per year  ? Active Member of Clubs or Organizations: Yes  ? Attends Archivist Meetings: More than 4 times per year  ? Marital  Status: Married  ?Intimate Partner Violence: Not At Risk  ? Fear of Current or Ex-Partner: No  ? Emotionally Abused: No  ? Physically Abused: No  ? Sexually Abused: No  ? ? ?Outpatient Medications Prior to Visit  ?Medication Sig Dispense Refill  ? aspirin EC 81 MG tablet Take 81 mg by mouth daily.    ? atorvastatin (LIPITOR) 20 MG tablet Take 1 tablet (20 mg total) by mouth daily. 90 tablet 0  ? Calcium Citrate-Vitamin D 500-400 MG-UNIT CHEW Chew 1 tablet by mouth 2 (two) times daily.     ? emtricitabine-tenofovir (TRUVADA) 200-300 MG tablet Take 1 tablet by mouth daily. 90 tablet 0  ? lisinopril (ZESTRIL) 30 MG tablet Take 1 tablet (30 mg total) by mouth daily. 90 tablet 1  ? metoprolol succinate (TOPROL-XL) 25 MG 24 hr tablet TAKE 1 TABLET BY MOUTH  DAILY 90 tablet 1  ? Multiple Vitamin (MULTIVITAMIN) tablet Take 1 tablet by mouth daily.    ? Multiple Vitamins-Minerals (PRESERVISION AREDS PO) Take by mouth.    ? Omega-3 Fatty Acids (FISH OIL) 1000 MG CAPS Take 1 capsule by mouth daily.    ? ?No facility-administered medications prior to visit.  ? ? ?Allergies  ?Allergen Reactions  ? Achromycin [Tetracycline] Hives  ? ? ?Review of Systems  ?Constitutional:  Negative for fever.  ?Musculoskeletal:   ?     (+) finger pain   ? ?   ?Objective:  ?  ?Physical Exam ?Constitutional:   ?   General: She is not in acute distress. ?   Appearance: Normal appearance. She is not ill-appearing.  ?HENT:  ?   Head: Normocephalic and atraumatic.  ?Musculoskeletal:  ?   Right hand: Laceration present.  ?   Comments: Right middle finger, swelling and erythema noted around nail bed, no fluctuance  ?Skin: ?   General: Skin is warm and dry.  ?Neurological:  ?   Mental Status: She is alert and oriented to person, place, and time.  ?Psychiatric:     ?   Behavior: Behavior normal.     ?   Judgment: Judgment normal.  ? ? ? ?BP 129/70   Pulse 69   Temp 98.4 ?F (36.9 ?C)   Resp 18   Ht '5\' 3"'$  (1.6 m)   Wt 134 lb (60.8 kg)   LMP 11/17/2010    SpO2 100%   BMI 23.74 kg/m?  ?Wt Readings  from Last 3 Encounters:  ?03/19/22 134 lb (60.8 kg)  ?03/07/22 135 lb (61.2 kg)  ?02/10/22 137 lb (62.1 kg)  ? ? ?   ?Assessment & Plan:  ? ?Problem List Items Addressed This Visit   ? ?  ? Unprioritized  ? Cellulitis of right middle finger  ?  New. Appears to have started with a paronychia. Recommended that she soak bid and apply antibiotic ointment. Rocephin 1 gm IM given in the clinic. This will be followed with bactrim ds bid x 1 week. I would like to see her back in 2 days for follow up before the weekend. She is advised to call if she develops increased pain/redness or swelling.  Pt verbalizes understanding.  ? ?  ?  ? ?Other Visit Diagnoses   ? ? Skin infection    -  Primary  ? Relevant Medications  ? sulfamethoxazole-trimethoprim (BACTRIM DS) 800-160 MG tablet  ? cefTRIAXone (ROCEPHIN) injection 1 g (Completed)  ? ?  ? ? ? ?Meds ordered this encounter  ?Medications  ? sulfamethoxazole-trimethoprim (BACTRIM DS) 800-160 MG tablet  ?  Sig: Take 1 tablet by mouth 2 (two) times daily.  ?  Dispense:  14 tablet  ?  Refill:  0  ?  Order Specific Question:   Supervising Provider  ?  Answer:   Penni Homans A [[id number]]  ? cefTRIAXone (ROCEPHIN) injection 1 g  ? ? ?I,Zite Okoli,acting as a Education administrator for [employer], NP.,have documented all relevant documentation on the behalf of Nance Pear, NP,as directed by  Nance Pear, NP while in the presence of Nance Pear, NP.  ? ?I, [name], NP, personally preformed the services described in this documentation.  All medical record entries made by the scribe were at my direction and in my presence.  I have reviewed the chart and discharge instructions (if applicable) and agree that the record reflects my personal performance and is accurate and complete. 03/19/2022 ?

## 2022-03-19 NOTE — Assessment & Plan Note (Signed)
New. Appears to have started with a paronychia. Recommended that she soak bid and apply antibiotic ointment. Rocephin 1 gm IM given in the clinic. This will be followed with bactrim ds bid x 1 week. I would like to see her back in 2 days for follow up before the weekend. She is advised to call if she develops increased pain/redness or swelling.  Pt verbalizes understanding.  ?

## 2022-03-21 ENCOUNTER — Ambulatory Visit (INDEPENDENT_AMBULATORY_CARE_PROVIDER_SITE_OTHER): Payer: Medicare Other | Admitting: Family

## 2022-03-21 DIAGNOSIS — L03011 Cellulitis of right finger: Secondary | ICD-10-CM | POA: Diagnosis not present

## 2022-03-21 NOTE — Progress Notes (Signed)
? ?Subjective:  ? ?By signing my name below, I, Melissa Grimes, attest that this documentation has been prepared under the direction and in the presence of [name] NP, 03/21/2022   ? ? Patient ID: Melissa Grimes, female    DOB: [birth year], 66 y.o.   MRN: [id number] ? ?Chief Complaint  ?Patient presents with  ? Follow-up  ?  2 days follow up skin infection, middle finger of the right hand  ? ? ?HPI ?Patient is in today for an office visit. ? ?Right Middle Finger Infection - The skin infection in her right middle finger is improving. She is continuing to take 160 - 800 MG of Bactrim DS twice a day. She notes swelling and tenderness is improving.  ? ?Health Maintenance Due  ?Topic Date Due  ? COVID-19 Vaccine (5 - Booster for Pfizer series) 05/06/2021  ? ? ?Past Medical History:  ?Diagnosis Date  ? Arthritis   ? Complication of anesthesia   ? difficulty urinating after  ? Hyperlipidemia   ? Hypertension   ? ? ?Past Surgical History:  ?Procedure Laterality Date  ? BUNIONECTOMY    ? CESAREAN SECTION    ? x 2  ? HERNIA REPAIR Left 11/18/1975  ? inguinal  ? [place]  ? toe removal Right 12/08/2021  ? right second toe  ? TOTAL HIP ARTHROPLASTY Right 01/18/2013  ? Procedure: Right TOTAL HIP ARTHROPLASTY ANTERIOR APPROACH;  Surgeon: Mcarthur Rossetti, MD;  Location: [facility];  Service: Orthopedics;  Laterality: Right;  ? TOTAL HIP ARTHROPLASTY Left 08/09/2013  ? Procedure: LEFT TOTAL HIP ARTHROPLASTY ANTERIOR APPROACH;  Surgeon: Mcarthur Rossetti, MD;  Location: [facility];  Service: Orthopedics;  Laterality: Left;  ? ? ?Family History  ?Problem Relation Age of Onset  ? Arthritis Mother   ?     osteoarthritis  ? Hyperlipidemia Mother   ? Heart disease Mother   ? Hypertension Mother   ? Hypothyroidism Mother   ? Hyperlipidemia Father   ? Heart disease Father   ? Stroke Father   ? Parkinson's disease Father   ? Cancer Cousin   ?     breast  ? Cancer Maternal Grandfather   ?     lung  ? Obesity  Sister   ?     "overweight"  ? Hypertension Sister   ? Varicose Veins Child   ? Asthma Child   ? Migraines Child   ? ? ?Social History  ? ?Socioeconomic History  ? Marital status: Married  ?  Spouse name: Not on file  ? Number of children: 2  ? Years of education: Not on file  ? Highest education level: Not on file  ?Occupational History  ? Not on file  ?Tobacco Use  ? Smoking status: Never  ? Smokeless tobacco: Never  ?Vaping Use  ? Vaping Use: Never used  ?Substance and Sexual Activity  ? Alcohol use: Yes  ?  Alcohol/week: 3.0 standard drinks  ?  Types: 3 Glasses of wine per week  ? Drug use: No  ? Sexual activity: Yes  ?Other Topics Concern  ? Not on file  ?Social History Narrative  ? 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 Married- from [place]  ?  children- grown both in [place]  ? Enjoys writing christian ed material1 cat  ? ?Social Determinants of Health  ? ?Financial Resource Strain: Low Risk   ? Difficulty of Paying Living Expenses: Not hard at all  ?  Food Insecurity: No Food Insecurity  ? Worried About Charity fundraiser in the Last Year: Never true  ? Ran Out of Food in the Last Year: Never true  ?Transportation Needs: No Transportation Needs  ? Lack of Transportation (Medical): No  ? Lack of Transportation (Non-Medical): No  ?Physical Activity: Sufficiently Active  ? Days of Exercise per Week: 2 days  ? Minutes of Exercise per Session: 120 min  ?Stress: No Stress Concern Present  ? Feeling of Stress : Not at all  ?Social Connections: Socially Integrated  ? Frequency of Communication with Friends and Family: Once a week  ? Frequency of Social Gatherings with Friends and Family: More than three times a week  ? Attends Religious Services: More than 4 times per year  ? Active Member of Clubs or Organizations: Yes  ? Attends Archivist Meetings: More than 4 times per year  ? Marital Status: Married  ?Intimate Partner Violence: Not At Risk  ? Fear of Current or Ex-Partner: No  ? Emotionally  Abused: No  ? Physically Abused: No  ? Sexually Abused: No  ? ? ?Outpatient Medications Prior to Visit  ?Medication Sig Dispense Refill  ? aspirin EC 81 MG tablet Take 81 mg by mouth daily.    ? atorvastatin (LIPITOR) 20 MG tablet Take 1 tablet (20 mg total) by mouth daily. 90 tablet 0  ? Calcium Citrate-Vitamin D 500-400 MG-UNIT CHEW Chew 1 tablet by mouth 2 (two) times daily.     ? emtricitabine-tenofovir (TRUVADA) 200-300 MG tablet Take 1 tablet by mouth daily. 90 tablet 0  ? lisinopril (ZESTRIL) 30 MG tablet Take 1 tablet (30 mg total) by mouth daily. 90 tablet 1  ? metoprolol succinate (TOPROL-XL) 25 MG 24 hr tablet TAKE 1 TABLET BY MOUTH  DAILY 90 tablet 1  ? Multiple Vitamin (MULTIVITAMIN) tablet Take 1 tablet by mouth daily.    ? Multiple Vitamins-Minerals (PRESERVISION AREDS PO) Take by mouth.    ? Omega-3 Fatty Acids (FISH OIL) 1000 MG CAPS Take 1 capsule by mouth daily.    ? sulfamethoxazole-trimethoprim (BACTRIM DS) 800-160 MG tablet Take 1 tablet by mouth 2 (two) times daily. 14 tablet 0  ? ?No facility-administered medications prior to visit.  ? ? ?Allergies  ?Allergen Reactions  ? Achromycin [Tetracycline] Hives  ? ? ?ROS ? ?  See HPI ? ?Objective:  ?  ?Physical Exam ?Constitutional:   ?   General: She is not in acute distress. ?   Appearance: Normal appearance. She is not ill-appearing.  ?HENT:  ?   Head: Normocephalic and atraumatic.  ?   Right Ear: External ear normal.  ?   Left Ear: External ear normal.  ?Cardiovascular:  ?   Rate and Rhythm: Normal rate.  ?Pulmonary:  ?   Effort: Pulmonary effort is normal.  ?Skin: ?   General: Skin is warm and dry.  ?Neurological:  ?   Mental Status: She is alert and oriented to person, place, and time.  ?Psychiatric:     ?   Mood and Affect: Mood normal.     ?   Behavior: Behavior normal.     ?   Judgment: Judgment normal.  ? ? ? ? ?BP 95/65 (BP Location: Right Arm, Patient Position: Sitting, Cuff Size: Small)   Pulse 65   Temp 98.1 ?F (36.7 ?C) (Oral)    Resp 16   Wt 136 lb (61.7 kg)   LMP 11/17/2010   SpO2 99%  BMI 24.09 kg/m?  ?Wt Readings from Last 3 Encounters:  ?03/21/22 136 lb (61.7 kg)  ?03/19/22 134 lb (60.8 kg)  ?03/07/22 135 lb (61.2 kg)  ? ? ?   ?Assessment & Plan:  ? ?Problem List Items Addressed This Visit   ? ?  ? Unprioritized  ? Cellulitis of right middle finger  ?  Showing some improvement. Advised pt to complete bactrim rx.  Let me know if not resolved by the end of the course and we can extend the course.   ? ?  ?  ? ? ? ?No orders of the defined types were placed in this encounter. ? ? ?I, Nance Pear, NP, personally preformed the services described in this documentation.  All medical record entries made by the scribe were at my direction and in my presence.  I have reviewed the chart and discharge instructions (if applicable) and agree that the record reflects my personal performance and is accurate and complete. 03/21/2022 ? ? ?I,Amber Collins,acting as a Education administrator for [employer], NP.,have documented all relevant documentation on the behalf of Nance Pear, NP,as directed by  Nance Pear, NP while in the presence of Nance Pear, NP. ? ? ? ?Nance Pear, NP ? ?

## 2022-03-21 NOTE — Assessment & Plan Note (Signed)
Showing some improvement. Advised pt to complete bactrim rx.  Let me know if not resolved by the end of the course and we can extend the course.   ?

## 2022-03-21 NOTE — Patient Instructions (Signed)
Please complete bactrim. ?Call if pain/redness does not continue to improve . ?

## 2022-04-07 ENCOUNTER — Other Ambulatory Visit: Payer: Self-pay

## 2022-04-07 ENCOUNTER — Telehealth: Payer: Self-pay | Admitting: Family

## 2022-04-07 ENCOUNTER — Telehealth: Payer: Self-pay

## 2022-04-07 MED ORDER — METOPROLOL SUCCINATE ER 25 MG PO TB24: 25.0000 mg | ORAL_TABLET | Freq: Every day | ORAL | 1 refills | Status: DC

## 2022-04-07 NOTE — Telephone Encounter (Signed)
Rx sent 

## 2022-04-07 NOTE — Telephone Encounter (Signed)
Caller Name [place] Phone Number [phone] Patient Name Melissa Grimes Patient DOB 1956/06/18 Call Type Message Only Information Provided Reason for Call Medication Question / Request Initial Comment Caller states she needs a refill on her medications. Disp. Time Disposition Final User 04/07/2022 7:37:12 AM General Information Provided Yes Dara Hoyer Call Closed By: Dara Hoyer Transaction Date/Time: 04/07/2022 7:33:42 AM (ET)

## 2022-04-07 NOTE — Telephone Encounter (Signed)
Pt will be going out of town and needs rx picked up at a local pharmacy.   Medication: metoprolol succinate (TOPROL-XL) 25 MG 24 hr tablet  Has the patient contacted their pharmacy? No.  Preferred Pharmacy:  [facility], [place]   [address], [place] [address]  Phone:  [phone]  Fax:  [phone]

## 2022-04-07 NOTE — Telephone Encounter (Signed)
Patient advised metoprolol sent to her pharmacy

## 2022-04-23 ENCOUNTER — Other Ambulatory Visit: Payer: Self-pay | Admitting: Family

## 2022-04-25 ENCOUNTER — Other Ambulatory Visit: Payer: Self-pay | Admitting: Family

## 2022-05-01 ENCOUNTER — Encounter: Payer: Self-pay | Admitting: Family Medicine

## 2022-05-01 ENCOUNTER — Other Ambulatory Visit (HOSPITAL_COMMUNITY)
Admission: RE | Admit: 2022-05-01 | Discharge: 2022-05-01 | Disposition: A | Discharge status: Home / Self Care | Payer: Medicare Other | Source: Ambulatory Visit | Attending: Family Medicine | Admitting: Family Medicine

## 2022-05-01 ENCOUNTER — Ambulatory Visit (INDEPENDENT_AMBULATORY_CARE_PROVIDER_SITE_OTHER): Payer: Medicare Other | Admitting: Family Medicine

## 2022-05-01 VITALS — BP 123/74 | HR 60 | Ht 63.0 in | Wt 134.0 lb

## 2022-05-01 DIAGNOSIS — Z1151 Encounter for screening for human papillomavirus (HPV): Secondary | ICD-10-CM | POA: Insufficient documentation

## 2022-05-01 DIAGNOSIS — Z01419 Encounter for gynecological examination (general) (routine) without abnormal findings: Secondary | ICD-10-CM | POA: Diagnosis present

## 2022-05-01 DIAGNOSIS — R87612 Low grade squamous intraepithelial lesion on cytologic smear of cervix (LGSIL): Secondary | ICD-10-CM | POA: Diagnosis present

## 2022-05-01 DIAGNOSIS — Z1239 Encounter for other screening for malignant neoplasm of breast: Secondary | ICD-10-CM | POA: Diagnosis not present

## 2022-05-01 NOTE — Progress Notes (Signed)
GYNECOLOGY ANNUAL PREVENTATIVE CARE ENCOUNTER NOTE  Subjective:   Melissa Grimes is a 66 y.o. G29P2002 female here for a routine annual gynecologic exam.  Current complaints: none.   Denies abnormal vaginal bleeding, discharge, pelvic pain, problems with intercourse or other gynecologic concerns.    Gynecologic History Patient's last menstrual period was 11/17/2010. Patient is sexually active - multiple partners Contraception: post menopausal status Last Pap: history of LSIL in 2020. +HPV 2021. Normal 2022. Last mammogram: 2022. Results were: birads 1    The pregnancy intention screening data noted above was reviewed. Potential methods of contraception were discussed. The patient elected to proceed with No data recorded.   Obstetric History OB History  Gravida Para Term Preterm AB Living  '2 2 2     2  '$ SAB IAB Ectopic Multiple Live Births          2    # Outcome Date GA Lbr Len/2nd Weight Sex Delivery Anes PTL Lv  2 Term 1985 [redacted]w[redacted]d  F CS-LTranv Spinal N LIV  1 Term 1982 [id number] M CS-LTranv EPI N LIV    Past Medical History:  Diagnosis Date   Arthritis    Complication of anesthesia    difficulty urinating after   Hyperlipidemia    Hypertension     Past Surgical History:  Procedure Laterality Date   BUNIONECTOMY     CESAREAN SECTION     x 2   HERNIA REPAIR Left 11/18/1975   inguinal   OSTEOTOMY  1974 & 1975   toe removal Right 12/08/2021   right second toe   TOTAL HIP ARTHROPLASTY Right 01/18/2013   Procedure: Right TOTAL HIP ARTHROPLASTY ANTERIOR APPROACH;  Surgeon: ChMcarthur RossettiMD;  [facility];  Laterality: Right;   TOTAL HIP ARTHROPLASTY Left 08/09/2013   Procedure: LEFT TOTAL HIP ARTHROPLASTY ANTERIOR APPROACH;  Surgeon: ChMcarthur RossettiMD;  [facility];  Laterality: Left;    Current Outpatient Medications on File Prior to Visit  Medication Sig Dispense Refill   aspirin EC 81 MG  tablet Take 81 mg by mouth daily.     atorvastatin (LIPITOR) 20 MG tablet Take 1 tablet (20 mg total) by mouth daily. 90 tablet 0   Calcium Citrate-Vitamin D 500-400 MG-UNIT CHEW Chew 1 tablet by mouth 2 (two) times daily.      emtricitabine-tenofovir (TRUVADA) 200-300 MG tablet Take 1 tablet by mouth daily. 90 tablet 0   lisinopril (ZESTRIL) 30 MG tablet TAKE 1 TABLET BY MOUTH  DAILY 90 tablet 1   metoprolol succinate (TOPROL-XL) 25 MG 24 hr tablet Take 1 tablet (25 mg total) by mouth daily. 90 tablet 1   Multiple Vitamin (MULTIVITAMIN) tablet Take 1 tablet by mouth daily.     Multiple Vitamins-Minerals (PRESERVISION AREDS PO) Take by mouth.     Omega-3 Fatty Acids (FISH OIL) 1000 MG CAPS Take 1 capsule by mouth daily.     sulfamethoxazole-trimethoprim (BACTRIM DS) 800-160 MG tablet Take 1 tablet by mouth 2 (two) times daily. 14 tablet 0   No current facility-administered medications on file prior to visit.    Allergies  Allergen Reactions   Achromycin [Tetracycline] Hives    Social History   Socioeconomic History   Marital status: Married    Spouse name: Not on file   Number of children: 2   Years of education: Not on file   Highest education level: Not on file  Occupational  History   Not on file  Tobacco Use   Smoking status: Never   Smokeless tobacco: Never  Vaping Use   Vaping Use: Never used  Substance and Sexual Activity   Alcohol use: Yes    Alcohol/week: 3.0 standard drinks of alcohol    Types: 3 Glasses of wine per week   Drug use: No   Sexual activity: Yes  Other Topics Concern   Not on file  Social History Narrative   Corporate treasurer   Completed 2 yrs college   Married- from [employer]    children- grown both in [place]   Enjoys writing christian ed Administrator, sports   Social Determinants of Health   Financial Resource Strain: Low Risk  (12/23/2021)   Overall Financial Resource Strain (CARDIA)    Difficulty of Paying Living Expenses: Not hard at all  Food  Insecurity: No Food Insecurity (12/23/2021)   Hunger Vital Sign    Worried About Running Out of Food in the Last Year: Never true    [place] in the Last Year: Never true  Transportation Needs: No Transportation Needs (12/23/2021)   PRAPARE - Hydrologist (Medical): No    Lack of Transportation (Non-Medical): No  Physical Activity: Sufficiently Active (12/23/2021)   Exercise Vital Sign    Days of Exercise per Week: 2 days    Minutes of Exercise per Session: 120 min  Stress: No Stress Concern Present (12/23/2021)   [place]    Feeling of Stress : Not at all  Social Connections: Socially Integrated (12/23/2021)   Social Connection and Isolation Panel [NHANES]    Frequency of Communication with Friends and Family: Once a week    Frequency of Social Gatherings with Friends and Family: More than three times a week    Attends Religious Services: More than 4 times per year    Active Member of [employer] or Organizations: Yes    Attends Music therapist: More than 4 times per year    Marital Status: Married  Human resources officer Violence: Not At Risk (12/23/2021)   Humiliation, Afraid, Rape, and Kick questionnaire    Fear of Current or Ex-Partner: No    Emotionally Abused: No    Physically Abused: No    Sexually Abused: No    Family History  Problem Relation Age of Onset   Arthritis Mother        osteoarthritis   Hyperlipidemia Mother    Heart disease Mother    Hypertension Mother    Hypothyroidism Mother    Hyperlipidemia Father    Heart disease Father    Stroke Father    Parkinson's disease Father    Cancer Cousin        breast   Cancer Maternal Grandfather        lung   Obesity Sister        "overweight"   Hypertension Sister    Varicose Veins Child    Asthma Child    Migraines Child     The following portions of the patient's history were reviewed and updated as  appropriate: allergies, current medications, past family history, past medical history, past social history, past surgical history and problem list.  Review of Systems Pertinent items are noted in HPI.   Objective:  BP 123/74   Pulse 60   Ht '5\' 3"'$  (1.6 m)   Wt 134 lb (60.8 kg)   LMP 11/17/2010  BMI 23.74 kg/m  Wt Readings from Last 3 Encounters:  05/01/22 134 lb (60.8 kg)  03/21/22 136 lb (61.7 kg)  03/19/22 134 lb (60.8 kg)     Chaperone present during exam  CONSTITUTIONAL: Well-developed, well-nourished female in no acute distress.  HENT:  Normocephalic, atraumatic, External right and left ear normal. Oropharynx is clear and moist EYES: Conjunctivae and EOM are normal. Pupils are equal, round, and reactive to light. No scleral icterus.  NECK: Normal range of motion, supple, no masses.  Normal thyroid.   CARDIOVASCULAR: Normal heart rate noted, regular rhythm RESPIRATORY: Clear to auscultation bilaterally. Effort and breath sounds normal, no problems with respiration noted. BREASTS: Symmetric in size. No masses, skin changes, nipple drainage, or lymphadenopathy. ABDOMEN: Soft, normal bowel sounds, no distention noted.  No tenderness, rebound or guarding.  PELVIC: Normal appearing external genitalia; normal appearing vaginal mucosa and cervix.  No abnormal discharge noted.  Normal uterine size, no other palpable masses, no uterine or adnexal tenderness. MUSCULOSKELETAL: Normal range of motion. No tenderness.  No cyanosis, clubbing, or edema.  2+ distal pulses. SKIN: Skin is warm and dry. No rash noted. Not diaphoretic. No erythema. No pallor. NEUROLOGIC: Alert and oriented to person, place, and time. Normal reflexes, muscle tone coordination. No cranial nerve deficit noted. PSYCHIATRIC: Normal mood and affect. Normal behavior. Normal judgment and thought content.  Assessment:  Annual gynecologic examination with pap smear   Plan:  1. Well Woman Exam Will follow up results of  pap smear and manage accordingly. Mammogram scheduled  2. LGSIL on Pap smear of cervix PAP today  3. Breast cancer screening, high risk patient - MM Digital Screening; Future   Routine preventative health maintenance measures emphasized. Please refer to After Visit Summary for other counseling recommendations.    Loma Boston, [place] for [employer]

## 2022-05-01 NOTE — Addendum Note (Signed)
Addended by: Phill Myron on: 05/01/2022 11:03 AM   Modules accepted: Orders

## 2022-05-01 NOTE — Progress Notes (Signed)
Patient presents

## 2022-05-04 ENCOUNTER — Other Ambulatory Visit: Payer: Self-pay | Admitting: Family

## 2022-05-05 ENCOUNTER — Telehealth (HOSPITAL_BASED_OUTPATIENT_CLINIC_OR_DEPARTMENT_OTHER): Payer: Self-pay

## 2022-05-05 LAB — CYTOLOGY - PAP
Comment: NEGATIVE
Diagnosis: NEGATIVE
High risk HPV: NEGATIVE

## 2022-05-14 ENCOUNTER — Ambulatory Visit (INDEPENDENT_AMBULATORY_CARE_PROVIDER_SITE_OTHER): Payer: Medicare Other | Admitting: Family

## 2022-05-14 ENCOUNTER — Telehealth: Payer: Self-pay | Admitting: Family

## 2022-05-14 ENCOUNTER — Other Ambulatory Visit (HOSPITAL_COMMUNITY)
Admission: RE | Admit: 2022-05-14 | Discharge: 2022-05-14 | Disposition: A | Discharge status: Home / Self Care | Payer: Medicare Other | Source: Ambulatory Visit | Attending: Family | Admitting: Family

## 2022-05-14 VITALS — BP 118/74 | HR 59 | Temp 97.8°F | Resp 16 | Wt 135.0 lb

## 2022-05-14 DIAGNOSIS — Z113 Encounter for screening for infections with a predominantly sexual mode of transmission: Secondary | ICD-10-CM | POA: Insufficient documentation

## 2022-05-14 DIAGNOSIS — J309 Allergic rhinitis, unspecified: Secondary | ICD-10-CM | POA: Diagnosis not present

## 2022-05-14 DIAGNOSIS — I10 Essential (primary) hypertension: Secondary | ICD-10-CM

## 2022-05-14 DIAGNOSIS — E785 Hyperlipidemia, unspecified: Secondary | ICD-10-CM

## 2022-05-14 DIAGNOSIS — L03011 Cellulitis of right finger: Secondary | ICD-10-CM

## 2022-05-14 DIAGNOSIS — Z7251 High risk heterosexual behavior: Secondary | ICD-10-CM

## 2022-05-14 LAB — LIPID PANEL
Cholesterol: 140 mg/dL (ref 0–200)
HDL: 57.2 mg/dL (ref >39.00)
LDL Cholesterol: 65 mg/dL (ref 0–99)
NonHDL: 82.73
Total CHOL/HDL Ratio: 2
Triglycerides: 87 mg/dL (ref 0.0–149.0)
VLDL: 17.4 mg/dL (ref 0.0–40.0)

## 2022-05-14 MED ORDER — EMTRICITABINE-TENOFOVIR DF 200-300 MG PO TABS: 1.0000 | ORAL_TABLET | Freq: Every day | ORAL | 0 refills | Status: DC

## 2022-05-14 MED ORDER — LISINOPRIL 30 MG PO TABS: 30.0000 mg | ORAL_TABLET | Freq: Every day | ORAL | 1 refills | Status: DC

## 2022-05-14 MED ORDER — ATORVASTATIN CALCIUM 20 MG PO TABS: 20.0000 mg | ORAL_TABLET | Freq: Every day | ORAL | 1 refills | Status: DC

## 2022-05-14 NOTE — Assessment & Plan Note (Addendum)
She would like to try to come off of atorvastatin. Given her previous hx of stroke, recommend that she stay on atorvastatin.  See mychart message.

## 2022-05-14 NOTE — Patient Instructions (Signed)
Stop atorvastatin

## 2022-05-14 NOTE — Assessment & Plan Note (Deleted)
Lab Results  Component Value Date   CHOL 138 02/10/2022   HDL 53.60 02/10/2022   LDLCALC 65 02/10/2022   LDLDIRECT 102.0 08/06/2015   TRIG 94.0 02/10/2022   CHOLHDL 3 02/10/2022   At goal, continue

## 2022-05-14 NOTE — Progress Notes (Signed)
Subjective:   By signing my name below, I, Melissa Grimes, attest that this documentation has been prepared under the direction and in the presence of Melissa Chimera, NP 05/14/2022      Patient ID: Melissa Grimes, female    DOB: 1956/02/18, 66 y.o.   MRN: [id number]  Chief Complaint  Patient presents with   Hyperlipidemia    Here for follow up    HPI Patient is in today for an office visit  Refill: She is requesting a refill of 200-300 Mg of Truvada and 25 Mg of Metoprolol Succinate  STD Screening: She is interested in getting an STD testing.  Atorvastatin: She has been taking 20 Mg of Atorvastatin but is interested in discontinuing the medication.  Lab Results  Component Value Date   CHOL 138 02/10/2022   HDL 53.60 02/10/2022   LDLCALC 65 02/10/2022   LDLDIRECT 102.0 08/06/2015   TRIG 94.0 02/10/2022   CHOLHDL 3 02/10/2022   Allergies: Her allergies are controlled. She states that her allergies worsen in [place].  Cellulitis of Right Middle Finger: She reports that the infection in her right middle finger is resolved.  Retirement: She is enjoying her retirement.   There are no preventive care reminders to display for this patient.  Past Medical History:  Diagnosis Date   Arthritis    Complication of anesthesia    difficulty urinating after   Hyperlipidemia    Hypertension     Past Surgical History:  Procedure Laterality Date   BUNIONECTOMY     CESAREAN SECTION     x 2   HERNIA REPAIR Left 11/18/1975   inguinal   OSTEOTOMY  1974 & 1975   toe removal Right 12/08/2021   right second toe   TOTAL HIP ARTHROPLASTY Right 01/18/2013   Procedure: Right TOTAL HIP ARTHROPLASTY ANTERIOR APPROACH;  Surgeon: Mcarthur Rossetti, MD;  Location: [facility];  Service: Orthopedics;  Laterality: Right;   TOTAL HIP ARTHROPLASTY Left 08/09/2013   Procedure: LEFT TOTAL HIP ARTHROPLASTY ANTERIOR APPROACH;  Surgeon: Mcarthur Rossetti, MD;  Location: [facility];   Service: Orthopedics;  Laterality: Left;    Family History  Problem Relation Age of Onset   Arthritis Mother        osteoarthritis   Hyperlipidemia Mother    Heart disease Mother    Hypertension Mother    Hypothyroidism Mother    Hyperlipidemia Father    Heart disease Father    Stroke Father    Parkinson's disease Father    Cancer Cousin        breast   Cancer Maternal Grandfather        lung   Obesity Sister        "overweight"   Hypertension Sister    Varicose Veins Child    Asthma Child    Migraines Child     Social History   Socioeconomic History   Marital status: Married    Spouse name: Not on file   Number of children: 2   Years of education: Not on file   Highest education level: Not on file  Occupational History   Not on file  Tobacco Use   Smoking status: Never   Smokeless tobacco: Never  Vaping Use   Vaping Use: Never used  Substance and Sexual Activity   Alcohol use: Yes    Alcohol/week: 3.0 standard drinks of alcohol    Types: 3 Glasses of wine per week   Drug use: No  Sexual activity: Yes  Other Topics Concern   Not on file  Social History Narrative   Graphic designer   Completed 2 yrs college   Married- from [employer]    children- grown both in [place]   Enjoys writing christian ed Administrator, sports   Social Determinants of Health   Financial Resource Strain: Low Risk  (12/23/2021)   Overall Financial Resource Strain (CARDIA)    Difficulty of Paying Living Expenses: Not hard at all  Food Insecurity: No Food Insecurity (12/23/2021)   Hunger Vital Sign    Worried About Running Out of Food in the Last Year: Never true    [place] in the Last Year: Never true  Transportation Needs: No Transportation Needs (12/23/2021)   PRAPARE - Hydrologist (Medical): No    Lack of Transportation (Non-Medical): No  Physical Activity: Sufficiently Active (12/23/2021)   Exercise Vital Sign    Days of Exercise per Week: 2 days     Minutes of Exercise per Session: 120 min  Stress: No Stress Concern Present (12/23/2021)   [place]    Feeling of Stress : Not at all  Social Connections: [place] (12/23/2021)   Social Connection and Isolation Panel [NHANES]    Frequency of Communication with Friends and Family: Once a week    Frequency of Social Gatherings with Friends and Family: More than three times a week    Attends Religious Services: More than 4 times per year    Active Member of [employer] or Organizations: Yes    Attends Music therapist: More than 4 times per year    Marital Status: Married  Human resources officer Violence: Not At Risk (12/23/2021)   Humiliation, Afraid, Rape, and Kick questionnaire    Fear of Current or Ex-Partner: No    Emotionally Abused: No    Physically Abused: No    Sexually Abused: No    Outpatient Medications Prior to Visit  Medication Sig Dispense Refill   aspirin EC 81 MG tablet Take 81 mg by mouth daily.     Calcium Citrate-Vitamin D 500-400 MG-UNIT CHEW Chew 1 tablet by mouth 2 (two) times daily.      metoprolol succinate (TOPROL-XL) 25 MG 24 hr tablet Take 1 tablet (25 mg total) by mouth daily. 90 tablet 1   Multiple Vitamin (MULTIVITAMIN) tablet Take 1 tablet by mouth daily.     Multiple Vitamins-Minerals (PRESERVISION AREDS PO) Take by mouth.     Omega-3 Fatty Acids (FISH OIL) 1000 MG CAPS Take 1 capsule by mouth daily.     atorvastatin (LIPITOR) 20 MG tablet TAKE 1 TABLET BY MOUTH  DAILY 100 tablet 2   emtricitabine-tenofovir (TRUVADA) 200-300 MG tablet Take 1 tablet by mouth daily. 90 tablet 0   lisinopril (ZESTRIL) 30 MG tablet TAKE 1 TABLET BY MOUTH  DAILY 90 tablet 1   sulfamethoxazole-trimethoprim (BACTRIM DS) 800-160 MG tablet Take 1 tablet by mouth 2 (two) times daily. 14 tablet 0   No facility-administered medications prior to visit.    Allergies  Allergen Reactions   Achromycin  [Tetracycline] Hives    ROS    See HPI  Objective:    Physical Exam Constitutional:      General: She is not in acute distress.    Appearance: Normal appearance. She is not ill-appearing.  HENT:     Head: Normocephalic and atraumatic.     Right Ear: External ear  normal.     Left Ear: External ear normal.  Eyes:     Extraocular Movements: Extraocular movements intact.     Pupils: Pupils are equal, round, and reactive to light.  Neck:     Thyroid: No thyromegaly.  Cardiovascular:     Rate and Rhythm: Normal rate and regular rhythm.     Heart sounds: Normal heart sounds. No murmur heard.    No gallop.  Pulmonary:     Effort: Pulmonary effort is normal. No respiratory distress.     Breath sounds: Normal breath sounds. No wheezing or rales.  Lymphadenopathy:     Cervical: No cervical adenopathy.  Skin:    General: Skin is warm and dry.  Neurological:     Mental Status: She is alert and oriented to person, place, and time.  Psychiatric:        Mood and Affect: Mood normal.        Behavior: Behavior normal.        Judgment: Judgment normal.   GYN: pt performed self wet prep. Exam deferred  BP 118/74 (BP Location: Right Arm, Patient Position: Sitting, Cuff Size: Small)   Pulse (!) 59   Temp 97.8 F (36.6 C) (Oral)   Resp 16   Wt 135 lb (61.2 kg)   LMP 11/17/2010   SpO2 99%   BMI 23.91 kg/m  Wt Readings from Last 3 Encounters:  05/14/22 135 lb (61.2 kg)  05/01/22 134 lb (60.8 kg)  03/21/22 136 lb (61.7 kg)       Assessment & Plan:   Problem List Items Addressed This Visit       Unprioritized   Hyperlipidemia - Primary    She would like to try to come off of atorvastatin. Given her previous hx of stroke, recommend that she stay on atorvastatin.  See mychart message.       Relevant Medications   lisinopril (ZESTRIL) 30 MG tablet   atorvastatin (LIPITOR) 20 MG tablet   Other Relevant Orders   Lipid panel   HTN (hypertension)    BP Readings from Last 3  Encounters:  05/14/22 118/74  05/01/22 123/74  03/21/22 95/65  Stable on lisinopril and toprol xl.        Relevant Medications   lisinopril (ZESTRIL) 30 MG tablet   atorvastatin (LIPITOR) 20 MG tablet   High risk sexual behavior    Continues HIV prep therapy.  Check HIV screening.      Cellulitis of right middle finger    Resolved.      Allergic rhinitis    Stable.  Not on medication.      Other Visit Diagnoses     Screening examination for STD (sexually transmitted disease)       Relevant Orders   HIV antibody (with reflex)   RPR   HSV 2 antibody, IgG   Cervicovaginal ancillary only( [place])        Meds ordered this encounter  Medications   emtricitabine-tenofovir (TRUVADA) 200-300 MG tablet    Sig: Take 1 tablet by mouth daily.    Dispense:  90 tablet    Refill:  0    Patient says you did not receive refill sent on 1/9.  If that is the case please use this rx.  If you do have refill rx on hand, then, please disregard this rx.    Order Specific Question:   Supervising Provider    Answer:   Penni Homans A [[id number]]   lisinopril (ZESTRIL)  30 MG tablet    Sig: Take 1 tablet (30 mg total) by mouth daily.    Dispense:  90 tablet    Refill:  1    Requesting 1 year supply    Order Specific Question:   Supervising Provider    Answer:   Penni Homans A [[id number]]   atorvastatin (LIPITOR) 20 MG tablet    Sig: Take 1 tablet (20 mg total) by mouth daily.    Dispense:  90 tablet    Refill:  1    Order Specific Question:   Supervising Provider    Answer:   Penni Homans A [[id number]]    I, Nance Pear, NP, personally preformed the services described in this documentation.  All medical record entries made by the scribe were at my direction and in my presence.  I have reviewed the chart and discharge instructions (if applicable) and agree that the record reflects my personal performance and is accurate and complete. 05/14/2022   I,Amber Collins,acting as a scribe  for Nance Pear, NP.,have documented all relevant documentation on the behalf of Nance Pear, NP,as directed by  Nance Pear, NP while in the presence of Nance Pear, NP.  Nance Pear, NP

## 2022-05-14 NOTE — Assessment & Plan Note (Signed)
Resolved

## 2022-05-14 NOTE — Assessment & Plan Note (Signed)
Continues HIV prep therapy.  Check HIV screening.

## 2022-05-14 NOTE — Telephone Encounter (Signed)
See mychart.  

## 2022-05-14 NOTE — Assessment & Plan Note (Signed)
Stable.  Not on medication.

## 2022-05-14 NOTE — Assessment & Plan Note (Signed)
BP Readings from Last 3 Encounters:  05/14/22 118/74  05/01/22 123/74  03/21/22 95/65   Stable on lisinopril and toprol xl.

## 2022-05-15 LAB — CERVICOVAGINAL ANCILLARY ONLY
Chlamydia: NEGATIVE
Comment: NEGATIVE
Comment: NEGATIVE
Comment: NORMAL
Neisseria Gonorrhea: NEGATIVE
Trichomonas: NEGATIVE

## 2022-05-15 LAB — HSV 2 ANTIBODY, IGG: HSV 2 Glycoprotein G Ab, IgG: <0.9 index

## 2022-05-15 LAB — HIV ANTIBODY (ROUTINE TESTING W REFLEX): HIV 1&2 Ab, 4th Generation: NONREACTIVE

## 2022-05-15 LAB — RPR: RPR Ser Ql: NONREACTIVE

## 2022-05-17 ENCOUNTER — Other Ambulatory Visit: Payer: Self-pay | Admitting: Family

## 2022-05-21 MED ORDER — EMTRICITABINE-TENOFOVIR DF 200-300 MG PO TABS: 1.0000 | ORAL_TABLET | Freq: Every day | ORAL | 0 refills | Status: DC

## 2022-05-28 ENCOUNTER — Encounter: Payer: Self-pay | Admitting: Family

## 2022-06-04 ENCOUNTER — Telehealth (HOSPITAL_BASED_OUTPATIENT_CLINIC_OR_DEPARTMENT_OTHER): Payer: Self-pay

## 2022-06-07 ENCOUNTER — Other Ambulatory Visit: Payer: Self-pay | Admitting: Family

## 2022-06-18 ENCOUNTER — Ambulatory Visit (INDEPENDENT_AMBULATORY_CARE_PROVIDER_SITE_OTHER): Payer: Medicare Other | Admitting: Family

## 2022-06-18 VITALS — BP 121/80 | HR 56 | Temp 98.0°F | Resp 16 | Wt 136.0 lb

## 2022-06-18 DIAGNOSIS — H2513 Age-related nuclear cataract, bilateral: Secondary | ICD-10-CM | POA: Diagnosis not present

## 2022-06-18 DIAGNOSIS — J209 Acute bronchitis, unspecified: Secondary | ICD-10-CM | POA: Diagnosis not present

## 2022-06-18 MED ORDER — PREDNISONE 10 MG PO TABS: ORAL_TABLET | ORAL | 0 refills | Status: DC

## 2022-06-18 MED ORDER — ALBUTEROL SULFATE HFA 108 (90 BASE) MCG/ACT IN AERS: 2.0000 | INHALATION_SPRAY | Freq: Four times a day (QID) | RESPIRATORY_TRACT | 0 refills | Status: DC | PRN

## 2022-06-18 MED ORDER — AMOXICILLIN-POT CLAVULANATE 875-125 MG PO TABS: 1.0000 | ORAL_TABLET | Freq: Two times a day (BID) | ORAL | 0 refills | Status: DC

## 2022-06-18 NOTE — Progress Notes (Signed)
Subjective:   By signing my name below, I, Melissa Grimes, attest that this documentation has been prepared under the direction and in the presence of [name], NP 06/18/2022    Patient ID: Melissa Grimes, female    DOB: 23-Jun-1956, 66 y.o.   MRN: [id number]  No chief complaint on file.   HPI Patient is in today for follow up visit.  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  Drainage and Cough: She currently has been experiencing drainage which leads to a tickle in her throat. She reports her breathing leads to her cough which she describes as "slushy." She currently uses an inhaler to manage her symptoms, and she took Musinex this morning prior to the visit. The Musinex did not improve her symptoms. Of note, she says she had bronchitis years ago and believes this may have been triggered by the uplifted dust.   Covid-19 test: She states that she is negative after taking the Covid-19 test.  Large Pupils: Large pupils secondary to dilation around 8 am in this morning prior to visit.    Health Maintenance Due  Topic Date Due   COVID-19 Vaccine (6 - Pfizer series) 05/27/2022   INFLUENZA VACCINE  06/17/2022    Past Medical History:  Diagnosis Date   Arthritis    Complication of anesthesia    difficulty urinating after   Hyperlipidemia    Hypertension     Past Surgical History:  Procedure Laterality Date   BUNIONECTOMY     CESAREAN SECTION     x 2   HERNIA REPAIR Left 11/18/1975   inguinal   OSTEOTOMY  1974 & 1975   toe removal Right 12/08/2021   right second toe   TOTAL HIP ARTHROPLASTY Right 01/18/2013   Procedure: Right TOTAL HIP ARTHROPLASTY ANTERIOR APPROACH;  Surgeon: Mcarthur Rossetti, MD;  Location: [facility];  Service: Orthopedics;  Laterality: Right;   TOTAL HIP ARTHROPLASTY Left 08/09/2013   Procedure: LEFT TOTAL HIP ARTHROPLASTY ANTERIOR APPROACH;  Surgeon: Mcarthur Rossetti, MD;  Location: [facility];  Service:  Orthopedics;  Laterality: Left;    Family History  Problem Relation Age of Onset   Arthritis Mother        osteoarthritis   Hyperlipidemia Mother    Heart disease Mother    Hypertension Mother    Hypothyroidism Mother    Hyperlipidemia Father    Heart disease Father    Stroke Father    Parkinson's disease Father    Cancer Cousin        breast   Cancer Maternal Grandfather        lung   Obesity Sister        "overweight"   Hypertension Sister    Varicose Veins Child    Asthma Child    Migraines Child     Social History   Socioeconomic History   Marital status: Married    Spouse name: Not on file   Number of children: 2   Years of education: Not on file   Highest education level: Not on file  Occupational History   Not on file  Tobacco Use   Smoking status: Never   Smokeless tobacco: Never  Vaping Use   Vaping Use: Never used  Substance and Sexual Activity   Alcohol use: Yes    Alcohol/week: 3.0 standard drinks of alcohol    Types: 3 Glasses of wine per week   Drug use: No   Sexual activity: Yes  Other Topics Concern   Not on file  Social History Narrative   Corporate treasurer   Completed 2 yrs college   Married- from [employer]    children- grown both in [place]   Enjoys writing christian ed Administrator, sports   Social Determinants of Health   Financial Resource Strain: Low Risk  (12/23/2021)   Overall Financial Resource Strain (CARDIA)    Difficulty of Paying Living Expenses: Not hard at all  Food Insecurity: No Food Insecurity (12/23/2021)   Hunger Vital Sign    Worried About Running Out of Food in the Last Year: Never true    [place] in the Last Year: Never true  Transportation Needs: No Transportation Needs (12/23/2021)   PRAPARE - Hydrologist (Medical): No    Lack of Transportation (Non-Medical): No  Physical Activity: Sufficiently Active (12/23/2021)   Exercise Vital Sign    Days of Exercise per Week: 2 days     Minutes of Exercise per Session: 120 min  Stress: No Stress Concern Present (12/23/2021)   [place]    Feeling of Stress : Not at all  Social Connections: [place] (12/23/2021)   Social Connection and Isolation Panel [NHANES]    Frequency of Communication with Friends and Family: Once a week    Frequency of Social Gatherings with Friends and Family: More than three times a week    Attends Religious Services: More than 4 times per year    Active Member of [employer] or Organizations: Yes    Attends Music therapist: More than 4 times per year    Marital Status: Married  Human resources officer Violence: Not At Risk (12/23/2021)   Humiliation, Afraid, Rape, and Kick questionnaire    Fear of Current or Ex-Partner: No    Emotionally Abused: No    Physically Abused: No    Sexually Abused: No    Outpatient Medications Prior to Visit  Medication Sig Dispense Refill   aspirin EC 81 MG tablet Take 81 mg by mouth daily.     atorvastatin (LIPITOR) 20 MG tablet Take 1 tablet (20 mg total) by mouth daily. 90 tablet 1   Calcium Citrate-Vitamin D 500-400 MG-UNIT CHEW Chew 1 tablet by mouth 2 (two) times daily.      emtricitabine-tenofovir (TRUVADA) 200-300 MG tablet Take 1 tablet by mouth daily. 90 tablet 0   lisinopril (ZESTRIL) 30 MG tablet Take 1 tablet (30 mg total) by mouth daily. 90 tablet 1   metoprolol succinate (TOPROL-XL) 25 MG 24 hr tablet TAKE 1 TABLET BY MOUTH ONCE  DAILY 100 tablet 2   Multiple Vitamin (MULTIVITAMIN) tablet Take 1 tablet by mouth daily.     Multiple Vitamins-Minerals (PRESERVISION AREDS PO) Take by mouth.     Omega-3 Fatty Acids (FISH OIL) 1000 MG CAPS Take 1 capsule by mouth daily.     No facility-administered medications prior to visit.    Allergies  Allergen Reactions   Achromycin [Tetracycline] Hives    Review of Systems  HENT:         (+) Sinus Drainage  Respiratory:  Positive for  cough and wheezing.        (+) Difficulty breathing       Objective:    Physical Exam Constitutional:      Appearance: Normal appearance. She is not ill-appearing.  HENT:     Head: Normocephalic and atraumatic.     Right Ear:  External ear normal.     Left Ear: External ear normal.  Eyes:     Extraocular Movements: Extraocular movements intact.     Right eye: No nystagmus.     Left eye: No nystagmus.     Pupils: Pupils are equal, round, and reactive to light.     Comments: (+) large pupils secondary due to dilation prior to visit   Cardiovascular:     Rate and Rhythm: Normal rate and regular rhythm.     Pulses: Normal pulses.     Heart sounds: No murmur heard.    No gallop.  Pulmonary:     Effort: Pulmonary effort is normal. No respiratory distress.     Breath sounds: Normal breath sounds. No wheezing or rales.  Lymphadenopathy:     Cervical: No cervical adenopathy.  Skin:    General: Skin is warm and dry.  Neurological:     Mental Status: She is alert and oriented to person, place, and time.  Psychiatric:        Judgment: Judgment normal.     LMP 11/17/2010  Wt Readings from Last 3 Encounters:  05/14/22 135 lb (61.2 kg)  05/01/22 134 lb (60.8 kg)  03/21/22 136 lb (61.7 kg)       Assessment & Plan:   Problem List Items Addressed This Visit   None    No orders of the defined types were placed in this encounter.   I, Melissa Grimes, personally preformed the services described in this documentation.  All medical record entries made by the scribe were at my direction and in my presence.  I have reviewed the chart and discharge instructions (if applicable) and agree that the record reflects my personal performance and is accurate and complete. 06/18/2022   I,Tinashe Williams,acting as a scribe for Nance Pear, NP.,have documented all relevant documentation on the behalf of Nance Pear, NP,as directed by  Nance Pear, NP while in the  presence of Nance Pear, NP.    Melissa Grimes

## 2022-06-18 NOTE — Patient Instructions (Signed)
Start Augmentin (antibiotic), prednisone taper, and albuterol as needed. Call if symptoms worsen or if symptoms are not improved in 3-4 days.

## 2022-06-19 DIAGNOSIS — J209 Acute bronchitis, unspecified: Secondary | ICD-10-CM | POA: Insufficient documentation

## 2022-06-19 NOTE — Assessment & Plan Note (Signed)
New. Pt is advised as follows:  Start Augmentin (antibiotic), prednisone taper, and albuterol as needed. Call if symptoms worsen or if symptoms are not improved in 3-4 days.

## 2022-06-23 ENCOUNTER — Telehealth: Payer: Self-pay | Admitting: *Deleted

## 2022-06-23 NOTE — Addendum Note (Signed)
Addended by: Jiles Prows on: 06/23/2022 04:01 PM   Modules accepted: Orders

## 2022-06-23 NOTE — Telephone Encounter (Signed)
Pt has lab appointment scheduled for Wednesday.  I do not see any future orders in place for this pt.  Please place future orders if appropriate or call pt to cancel if labs are not needed at this time.

## 2022-06-24 NOTE — Telephone Encounter (Signed)
I actually don't think she is due for any labs at this time. I left a message on her voicemail asking her to let me know via mychart if there is something in particular she wants done tomorrow otherwise we can cancel her appointment.

## 2022-06-25 ENCOUNTER — Other Ambulatory Visit: Payer: Self-pay

## 2022-06-25 ENCOUNTER — Other Ambulatory Visit: Payer: Medicare Other

## 2022-06-25 ENCOUNTER — Telehealth: Payer: Self-pay

## 2022-06-25 ENCOUNTER — Other Ambulatory Visit (INDEPENDENT_AMBULATORY_CARE_PROVIDER_SITE_OTHER): Payer: Medicare Other

## 2022-06-25 DIAGNOSIS — E785 Hyperlipidemia, unspecified: Secondary | ICD-10-CM

## 2022-06-25 LAB — LIPID PANEL
Cholesterol: 146 mg/dL (ref 0–200)
HDL: 57.8 mg/dL (ref >39.00)
LDL Cholesterol: 64 mg/dL (ref 0–99)
NonHDL: 87.86
Total CHOL/HDL Ratio: 3
Triglycerides: 121 mg/dL (ref 0.0–149.0)
VLDL: 24.2 mg/dL (ref 0.0–40.0)

## 2022-06-25 NOTE — Telephone Encounter (Signed)
Called Melissa Grimes to inform her that she did in fact need labs due to miss communication and no labs ordered patient left with out labs, I apologized to patient and asked her to call and reschedule appointment and lipid panel ordered.

## 2022-07-18 ENCOUNTER — Other Ambulatory Visit: Payer: Self-pay | Admitting: Family

## 2022-07-23 ENCOUNTER — Ambulatory Visit (INDEPENDENT_AMBULATORY_CARE_PROVIDER_SITE_OTHER): Payer: Medicare Other | Admitting: Family Medicine

## 2022-07-23 ENCOUNTER — Encounter: Payer: Self-pay | Admitting: Family Medicine

## 2022-07-23 VITALS — BP 102/62 | HR 68 | Temp 97.6°F | Ht 63.0 in | Wt 138.5 lb

## 2022-07-23 DIAGNOSIS — N3001 Acute cystitis with hematuria: Secondary | ICD-10-CM

## 2022-07-23 LAB — POC URINALSYSI DIPSTICK (AUTOMATED)
Glucose, UA: NEGATIVE
Ketones, UA: NEGATIVE
Nitrite, UA: POSITIVE
Protein, UA: POSITIVE — AB
Spec Grav, UA: 1.02 (ref 1.010–1.025)
Urobilinogen, UA: 0.2 E.U./dL
pH, UA: 7 (ref 5.0–8.0)

## 2022-07-23 MED ORDER — FLUCONAZOLE 150 MG PO TABS: ORAL_TABLET | ORAL | 0 refills | Status: DC

## 2022-07-23 MED ORDER — NITROFURANTOIN MONOHYD MACRO 100 MG PO CAPS: 100.0000 mg | ORAL_CAPSULE | Freq: Two times a day (BID) | ORAL | 0 refills | Status: AC

## 2022-07-23 NOTE — Addendum Note (Signed)
Addended by: Sharon Seller B on: 07/23/2022 04:01 PM   Modules accepted: Orders

## 2022-07-23 NOTE — Patient Instructions (Signed)
Stay hydrated.   Warning signs/symptoms: Uncontrollable nausea/vomiting, fevers, worsening symptoms despite treatment, confusion.  Give us around 2 business days to get culture back to you.  Let us know if you need anything. 

## 2022-07-23 NOTE — Progress Notes (Signed)
Chief Complaint  Patient presents with   Urinary Frequency   Hematuria    Concentration     Melissa Grimes is a 66 y.o. female here for possible UTI.  Duration: 1 day. Symptoms: Dysuria, urinary frequency, hematuria, urinary retention, and urgency Denies: urinary hesitancy, urinary retention, fever, nausea, vomiting, abd pain, vaginal discharge Hx of recurrent UTI? No Denies new sexual partners.  Past Medical History:  Diagnosis Date   Arthritis    Complication of anesthesia    difficulty urinating after   Hyperlipidemia    Hypertension      BP 102/62   Pulse 68   Temp 97.6 F (36.4 C) (Oral)   Ht '5\' 3"'$  (1.6 m)   Wt 138 lb 8 oz (62.8 kg)   LMP 11/17/2010   SpO2 97%   BMI 24.53 kg/m  General: Awake, alert, appears stated age Heart: RRR Lungs: CTAB, normal respiratory effort, no accessory muscle usage Abd: BS+, soft, NT, ND, no masses or organomegaly MSK: No CVA tenderness, neg Lloyd's sign Psych: Age appropriate judgment and insight  Acute cystitis with hematuria - Plan: fluconazole (DIFLUCAN) 150 MG tablet, nitrofurantoin, macrocrystal-monohydrate, (MACROBID) 100 MG capsule  5 d of Macrobid 100 mg bid. Diflucan should she develop a yeast infection. UA suggestive of infection.  Stay hydrated. Seek immediate care if pt starts to develop fevers, new/worsening symptoms, uncontrollable N/V. F/u prn. The patient voiced understanding and agreement to the plan.  [place], DO 07/23/22 3:55 PM

## 2022-07-26 ENCOUNTER — Encounter: Payer: Self-pay | Admitting: Family

## 2022-07-26 LAB — URINE CULTURE
MICRO NUMBER:: 13879170
SPECIMEN QUALITY:: ADEQUATE

## 2022-08-15 ENCOUNTER — Ambulatory Visit: Payer: Medicare Other | Admitting: Family

## 2022-08-22 ENCOUNTER — Other Ambulatory Visit (HOSPITAL_COMMUNITY)
Admission: RE | Admit: 2022-08-22 | Discharge: 2022-08-22 | Disposition: A | Discharge status: Home / Self Care | Payer: Medicare Other | Source: Ambulatory Visit | Attending: Family | Admitting: Family

## 2022-08-22 ENCOUNTER — Encounter: Payer: Self-pay | Admitting: Family

## 2022-08-22 ENCOUNTER — Ambulatory Visit (INDEPENDENT_AMBULATORY_CARE_PROVIDER_SITE_OTHER): Payer: Medicare Other | Admitting: Family

## 2022-08-22 VITALS — BP 122/80 | HR 65 | Temp 97.8°F | Resp 18 | Ht 63.0 in | Wt 135.2 lb

## 2022-08-22 DIAGNOSIS — Z113 Encounter for screening for infections with a predominantly sexual mode of transmission: Secondary | ICD-10-CM

## 2022-08-22 DIAGNOSIS — I10 Essential (primary) hypertension: Secondary | ICD-10-CM | POA: Diagnosis not present

## 2022-08-22 DIAGNOSIS — E785 Hyperlipidemia, unspecified: Secondary | ICD-10-CM

## 2022-08-22 DIAGNOSIS — Z7251 High risk heterosexual behavior: Secondary | ICD-10-CM

## 2022-08-22 MED ORDER — EMTRICITABINE-TENOFOVIR DF 200-300 MG PO TABS: 1.0000 | ORAL_TABLET | Freq: Every day | ORAL | 0 refills | Status: DC

## 2022-08-22 NOTE — Assessment & Plan Note (Signed)
BP Readings from Last 3 Encounters:  08/22/22 122/80  07/23/22 102/62  06/18/22 121/80   Stable, continue metoprolol and lisinopril.

## 2022-08-22 NOTE — Progress Notes (Signed)
Subjective:   By signing my name below, I, [name], attest that this documentation has been prepared under the direction and in the presence of [name], NP 08/22/2022      Melissa Grimes ID: Melissa Grimes, female    DOB: Nov 12, 1956, 66 y.o.   MRN: [id number]  Chief Complaint  Melissa Grimes presents with   Follow-up    HPI Melissa Grimes is in today for an office visit.  Refills: Melissa Grimes will be receiving a refill on 200-300 mg of Truvada.  Blood pressure: Patients blood pressure is looking normal this visit. BP Readings from Last 3 Encounters:  08/22/22 122/80  07/23/22 102/62  06/18/22 121/80   Pulse Readings from Last 3 Encounters:  08/22/22 65  07/23/22 68  06/18/22 (!) 56    Immunizations: There was confusion involving this Melissa Grimes's immunization with [facility] and Melissa Grimes is going to contact them to sort things out.  STD: Melissa Grimes is interested in receiving a STD screening. Health Maintenance Due  Topic Date Due   COVID-19 Vaccine (6 - Pfizer series) 05/27/2022    Past Medical History:  Diagnosis Date   Arthritis    Complication of anesthesia    difficulty urinating after   Hyperlipidemia    Hypertension     Past Surgical History:  Procedure Laterality Date   BUNIONECTOMY     CESAREAN SECTION     x 2   HERNIA REPAIR Left 11/18/1975   inguinal   OSTEOTOMY  1974 & 1975   toe removal Right 12/08/2021   right second toe   TOTAL HIP ARTHROPLASTY Right 01/18/2013   Procedure: Right TOTAL HIP ARTHROPLASTY ANTERIOR APPROACH;  Surgeon: Mcarthur Rossetti, MD;  Location: [facility];  Service: Orthopedics;  Laterality: Right;   TOTAL HIP ARTHROPLASTY Left 08/09/2013   Procedure: LEFT TOTAL HIP ARTHROPLASTY ANTERIOR APPROACH;  Surgeon: Mcarthur Rossetti, MD;  Location: [facility];  Service: Orthopedics;  Laterality: Left;    Family History  Problem Relation Age of Onset   Arthritis Mother        osteoarthritis   Hyperlipidemia Mother    Heart disease Mother     Hypertension Mother    Hypothyroidism Mother    Hyperlipidemia Father    Heart disease Father    Stroke Father    Parkinson's disease Father    Cancer Cousin        breast   Cancer Maternal Grandfather        lung   Obesity Sister        "overweight"   Hypertension Sister    Varicose Veins Child    Asthma Child    Migraines Child     Social History   Socioeconomic History   Marital status: Married    Spouse name: Not on file   Number of children: 2   Years of education: Not on file   Highest education level: Not on file  Occupational History   Not on file  Tobacco Use   Smoking status: Never   Smokeless tobacco: Never  Vaping Use   Vaping Use: Never used  Substance and Sexual Activity   Alcohol use: Yes    Alcohol/week: 3.0 standard drinks of alcohol    Types: 3 Glasses of wine per week   Drug use: No   Sexual activity: Yes  Other Topics Concern   Not on file  Social History Narrative   Graphic designer   Completed 2 yrs college   Married- from [employer]  children- grown both in [place]   Enjoys writing christian ed Administrator, sports   Social Determinants of Health   Financial Resource Strain: Low Risk  (12/23/2021)   Overall Financial Resource Strain (CARDIA)    Difficulty of Paying Living Expenses: Not hard at all  Food Insecurity: No Food Insecurity (12/23/2021)   Hunger Vital Sign    Worried About Running Out of Food in the Last Year: Never true    [place] in the Last Year: Never true  Transportation Needs: No Transportation Needs (12/23/2021)   PRAPARE - Hydrologist (Medical): No    Lack of Transportation (Non-Medical): No  Physical Activity: Sufficiently Active (12/23/2021)   Exercise Vital Sign    Days of Exercise per Week: 2 days    Minutes of Exercise per Session: 120 min  Stress: No Stress Concern Present (12/23/2021)   [place]    Feeling of  Stress : Not at all  Social Connections: [place] (12/23/2021)   Social Connection and Isolation Panel [NHANES]    Frequency of Communication with Friends and Family: Once a week    Frequency of Social Gatherings with Friends and Family: More than three times a week    Attends Religious Services: More than 4 times per year    Active Member of [employer] or Organizations: Yes    Attends Music therapist: More than 4 times per year    Marital Status: Married  Human resources officer Violence: Not At Risk (12/23/2021)   Humiliation, Afraid, Rape, and Kick questionnaire    Fear of Current or Ex-Partner: No    Emotionally Abused: No    Physically Abused: No    Sexually Abused: No    Outpatient Medications Prior to Visit  Medication Sig Dispense Refill   aspirin EC 81 MG tablet Take 81 mg by mouth daily.     atorvastatin (LIPITOR) 20 MG tablet Take 1 tablet (20 mg total) by mouth daily. 90 tablet 1   Calcium Citrate-Vitamin D 500-400 MG-UNIT CHEW Chew 1 tablet by mouth 2 (two) times daily.      lisinopril (ZESTRIL) 30 MG tablet TAKE 1 TABLET BY MOUTH DAILY 100 tablet 2   metoprolol succinate (TOPROL-XL) 25 MG 24 hr tablet TAKE 1 TABLET BY MOUTH ONCE  DAILY 100 tablet 2   Multiple Vitamin (MULTIVITAMIN) tablet Take 1 tablet by mouth daily.     Multiple Vitamins-Minerals (PRESERVISION AREDS PO) Take by mouth.     Omega-3 Fatty Acids (FISH OIL) 1000 MG CAPS Take 1 capsule by mouth daily.     emtricitabine-tenofovir (TRUVADA) 200-300 MG tablet Take 1 tablet by mouth daily. 90 tablet 0   fluconazole (DIFLUCAN) 150 MG tablet Take 1 tab, repeat in 72 hours if no improvement. 2 tablet 0   No facility-administered medications prior to visit.    Allergies  Allergen Reactions   Achromycin [Tetracycline] Hives    ROS See HPI    Objective:    Physical Exam Constitutional:      General: Melissa Grimes is not in acute distress.    Appearance: Normal appearance. Melissa Grimes is not ill-appearing.  HENT:      Head: Normocephalic and atraumatic.     Right Ear: External ear normal.     Left Ear: External ear normal.  Eyes:     Extraocular Movements: Extraocular movements intact.     Pupils: Pupils are equal, round, and reactive to light.  Cardiovascular:     Rate and Rhythm: Normal rate and regular rhythm.     Heart sounds: Normal heart sounds. No murmur heard.    No gallop.  Pulmonary:     Effort: Pulmonary effort is normal. No respiratory distress.     Breath sounds: Normal breath sounds. No wheezing or rales.  Skin:    General: Skin is warm and dry.  Neurological:     Mental Status: Melissa Grimes is alert and oriented to person, place, and time.  Psychiatric:        Mood and Affect: Mood normal.        Behavior: Behavior normal.        Judgment: Judgment normal.     BP 122/80   Pulse 65   Temp 97.8 F (36.6 C) (Oral)   Resp 18   Ht '5\' 3"'$  (1.6 m)   Wt 135 lb 3.2 oz (61.3 kg)   LMP 11/17/2010   SpO2 98%   BMI 23.95 kg/m  Wt Readings from Last 3 Encounters:  08/22/22 135 lb 3.2 oz (61.3 kg)  07/23/22 138 lb 8 oz (62.8 kg)  06/18/22 136 lb (61.7 kg)       Assessment & Plan:   Problem List Items Addressed This Visit       Unprioritized   HTN (hypertension)    BP Readings from Last 3 Encounters:  08/22/22 122/80  07/23/22 102/62  06/18/22 121/80  Stable, continue metoprolol and lisinopril.       High risk sexual behavior    Continues Truvada for HIV prophylaxis.      Dyslipidemia    Lab Results  Component Value Date   CHOL 146 06/25/2022   HDL 57.80 06/25/2022   LDLCALC 64 06/25/2022   LDLDIRECT 102.0 08/06/2015   TRIG 121.0 06/25/2022   CHOLHDL 3 06/25/2022  Lipids stable. Continue statin.       Other Visit Diagnoses     Screen for STD (sexually transmitted disease)    -  Primary   Relevant Orders   Herpes Simplex Virus 2(IgG)w/rflx to HSV2 Inhibition   HIV antibody (with reflex)   Cervicovaginal ancillary only( [facility])   RPR       Meds  ordered this encounter  Medications   emtricitabine-tenofovir (TRUVADA) 200-300 MG tablet    Sig: Take 1 tablet by mouth daily.    Dispense:  90 tablet    Refill:  0    Order Specific Question:   Supervising Provider    Answer:   Penni Homans A [[id number]]    I, Nance Pear, NP, personally preformed the services described in this documentation.  All medical record entries made by the scribe were at my direction and in my presence.  I have reviewed the chart and discharge instructions (if applicable) and agree that the record reflects my personal performance and is accurate and complete. 08/22/2022   I,Amber Collins,acting as a scribe for Nance Pear, NP.,have documented all relevant documentation on the behalf of Nance Pear, NP,as directed by  Nance Pear, NP while in the presence of Nance Pear, NP.      Nance Pear, NP

## 2022-08-22 NOTE — Assessment & Plan Note (Signed)
Lab Results  Component Value Date   CHOL 146 06/25/2022   HDL 57.80 06/25/2022   LDLCALC 64 06/25/2022   LDLDIRECT 102.0 08/06/2015   TRIG 121.0 06/25/2022   CHOLHDL 3 06/25/2022   Lipids stable. Continue statin.

## 2022-08-22 NOTE — Assessment & Plan Note (Signed)
Continues Truvada for HIV prophylaxis.

## 2022-08-22 NOTE — Assessment & Plan Note (Signed)
>>  ASSESSMENT AND PLAN FOR DYSLIPIDEMIA WRITTEN ON 08/22/2022  9:48 AM BY [name], NP  Lab Results  Component Value Date   CHOL 146 06/25/2022   HDL 57.80 06/25/2022   LDLCALC 64 06/25/2022   LDLDIRECT 102.0 08/06/2015   TRIG 121.0 06/25/2022   CHOLHDL 3 06/25/2022   Lipids stable. Continue statin.

## 2022-08-25 LAB — CERVICOVAGINAL ANCILLARY ONLY
Chlamydia: NEGATIVE
Comment: NEGATIVE
Comment: NEGATIVE
Comment: NORMAL
Neisseria Gonorrhea: NEGATIVE
Trichomonas: NEGATIVE

## 2022-08-25 LAB — HERPES SIMPLEX VIRUS 2(IGG) W/ REFLEX TO HSV2 INHIBITION: HSV 2 Glycoprotein G Ab, IgG: <0.9 index

## 2022-08-25 LAB — RPR: RPR Ser Ql: NONREACTIVE

## 2022-08-25 LAB — HIV ANTIBODY (ROUTINE TESTING W REFLEX): HIV 1&2 Ab, 4th Generation: NONREACTIVE

## 2022-08-28 ENCOUNTER — Telehealth: Payer: Self-pay | Admitting: Family

## 2022-08-28 NOTE — Telephone Encounter (Signed)
[facility] does not fill this medication for patient. She is requesting that emtricitabine-tenofovir (TRUVADA) 200-300 MG tablet [[id number]]  is sent to the [facility] on [address].

## 2022-08-29 MED ORDER — EMTRICITABINE-TENOFOVIR DF 200-300 MG PO TABS: 1.0000 | ORAL_TABLET | Freq: Every day | ORAL | 0 refills | Status: DC

## 2022-10-05 ENCOUNTER — Encounter: Payer: Self-pay | Admitting: Family

## 2022-10-05 DIAGNOSIS — T753XXA Motion sickness, initial encounter: Secondary | ICD-10-CM

## 2022-10-05 DIAGNOSIS — N3001 Acute cystitis with hematuria: Secondary | ICD-10-CM

## 2022-10-06 ENCOUNTER — Other Ambulatory Visit: Payer: Medicare Other

## 2022-10-06 DIAGNOSIS — N3001 Acute cystitis with hematuria: Secondary | ICD-10-CM | POA: Diagnosis not present

## 2022-10-06 MED ORDER — SCOPOLAMINE 1 MG/3DAYS TD PT72: 1.0000 | MEDICATED_PATCH | TRANSDERMAL | 0 refills | Status: DC

## 2022-10-06 MED ORDER — CEPHALEXIN 500 MG PO CAPS: 500.0000 mg | ORAL_CAPSULE | Freq: Four times a day (QID) | ORAL | 0 refills | Status: DC

## 2022-10-06 NOTE — Telephone Encounter (Signed)
Patient scheduled to be here for urine drop off this am

## 2022-10-06 NOTE — Telephone Encounter (Signed)
See my chart message

## 2022-10-06 NOTE — Telephone Encounter (Signed)
Please see the MyChart message reply(ies) for my assessment and plan.  The patient gave consent for this Medical Advice Message and is aware that it may result in a bill to their insurance company as well as the possibility that this may result in a co-payment or deductible. They are an established patient, but are not seeking medical advice exclusively about a problem treated during an in person or video visit in the last 7 days. I did not recommend an in person or video visit within 7 days of my reply.  I spent a total of 5 minutes cumulative provider time within 7 days through MyChart messaging.  Liviana Mills S O'Sullivan, NP  

## 2022-10-06 NOTE — Telephone Encounter (Signed)
Patient will be here at 10:30 for urine drop off

## 2022-10-09 LAB — URINE CULTURE
MICRO NUMBER:: 14212319
SPECIMEN QUALITY:: ADEQUATE

## 2022-10-14 ENCOUNTER — Other Ambulatory Visit: Payer: Self-pay | Admitting: Family

## 2022-12-29 ENCOUNTER — Other Ambulatory Visit (HOSPITAL_BASED_OUTPATIENT_CLINIC_OR_DEPARTMENT_OTHER): Payer: Self-pay | Admitting: Family

## 2022-12-29 ENCOUNTER — Ambulatory Visit (INDEPENDENT_AMBULATORY_CARE_PROVIDER_SITE_OTHER): Payer: Medicare Other | Admitting: *Deleted

## 2022-12-29 VITALS — BP 123/86 | HR 65 | Ht 63.0 in | Wt 136.4 lb

## 2022-12-29 DIAGNOSIS — Z1231 Encounter for screening mammogram for malignant neoplasm of breast: Secondary | ICD-10-CM

## 2022-12-29 DIAGNOSIS — Z0000 Encounter for general adult medical examination without abnormal findings: Secondary | ICD-10-CM

## 2022-12-29 NOTE — Progress Notes (Signed)
Subjective:   Melissa Grimes is a 67 y.o. female who presents for Medicare Annual (Subsequent) preventive examination.  Review of Systems    Defer to PCP Cardiac Risk Factors include: advanced age (>55mn, >>23women);dyslipidemia;hypertension     Objective:    Today's Vitals   12/29/22 0821  BP: 123/86  Pulse: 65  Weight: 136 lb 6.4 oz (61.9 kg)  Height: 5' 3"$  (1.6 m)   Body mass index is 24.16 kg/m.     12/29/2022    8:23 AM 12/23/2021    8:26 AM 10/18/2018    5:35 AM 08/14/2013    8:00 PM 08/11/2013    3:00 AM 08/01/2013    1:19 PM 01/18/2013    8:00 PM  Advanced Directives  Does Patient Have a Medical Advance Directive? Yes Yes No Patient has advance directive, copy not in chart Patient has advance directive, copy not in chart Patient has advance directive, copy not in chart Patient has advance directive, copy not in chart  Type of Advance [place]ive [place]  Living will;Healthcare Power of [place] will  Does patient want to make changes to medical advance directive? No - Patient declined        Copy of [place] Chart? No - copy requested No - copy requested  Copy requested from family Copy requested from family  Copy requested from family  Pre-existing out of facility DNR order (yellow form or pink MOST form)    No No No No    Current Medications (verified) Outpatient Encounter Medications as of 12/29/2022  Medication Sig   aspirin EC 81 MG tablet Take 81 mg by mouth daily.   atorvastatin (LIPITOR) 20 MG tablet TAKE 1 TABLET BY MOUTH DAILY   Calcium Citrate-Vitamin D 500-400 MG-UNIT CHEW Chew 1 tablet by mouth 2 (two) times daily.    emtricitabine-tenofovir (TRUVADA) 200-300 MG tablet Take 1 tablet by mouth daily.   lisinopril (ZESTRIL) 30 MG tablet TAKE 1 TABLET BY MOUTH DAILY   metoprolol succinate (TOPROL-XL) 25 MG 24 hr tablet TAKE 1  TABLET BY MOUTH ONCE  DAILY   Multiple Vitamin (MULTIVITAMIN) tablet Take 1 tablet by mouth daily.   Multiple Vitamins-Minerals (PRESERVISION AREDS PO) Take by mouth.   Omega-3 Fatty Acids (FISH OIL) 1000 MG CAPS Take 1 capsule by mouth daily.   [DISCONTINUED] cephALEXin (KEFLEX) 500 MG capsule Take 1 capsule (500 mg total) by mouth 4 (four) times daily.   [DISCONTINUED] scopolamine (TRANSDERM-SCOP) 1 MG/3DAYS Place 1 patch (1.5 mg total) onto the skin every 3 (three) days.   No facility-administered encounter medications on file as of 12/29/2022.    Allergies (verified) Achromycin [tetracycline]   History: Past Medical History:  Diagnosis Date   Arthritis    Complication of anesthesia    difficulty urinating after   Hyperlipidemia    Hypertension    Past Surgical History:  Procedure Laterality Date   BUNIONECTOMY     CESAREAN SECTION     x 2   HERNIA REPAIR Left 11/18/1975   inguinal   OSTEOTOMY  1974 & 1975   toe removal Right 12/08/2021   right second toe   TOTAL HIP ARTHROPLASTY Right 01/18/2013   Procedure: Right TOTAL HIP ARTHROPLASTY ANTERIOR APPROACH;  Surgeon: CMcarthur Rossetti MD;  [facility]  Service: Orthopedics;  Laterality: Right;   TOTAL HIP ARTHROPLASTY Left 08/09/2013   Procedure: LEFT TOTAL  HIP ARTHROPLASTY ANTERIOR APPROACH;  Surgeon: Mcarthur Rossetti, MD;  Location: [facility];  Service: Orthopedics;  Laterality: Left;   Family History  Problem Relation Age of Onset   Arthritis Mother        osteoarthritis   Hyperlipidemia Mother    Heart disease Mother    Hypertension Mother    Hypothyroidism Mother    Hyperlipidemia Father    Heart disease Father    Stroke Father    Parkinson's disease Father    Cancer Cousin        breast   Cancer Maternal Grandfather        lung   Obesity Sister        "overweight"   Hypertension Sister    Varicose Veins Child    Asthma Child    Migraines Child    Social History   Socioeconomic History    Marital status: Married    Spouse name: Not on file   Number of children: 2   Years of education: Not on file   Highest education level: Not on file  Occupational History   Not on file  Tobacco Use   Smoking status: Never   Smokeless tobacco: Never  Vaping Use   Vaping Use: Never used  Substance and Sexual Activity   Alcohol use: Yes    Alcohol/week: 3.0 standard drinks of alcohol    Types: 3 Glasses of wine per week   Drug use: No   Sexual activity: Yes  Other Topics Concern   Not on file  Social History Narrative   Corporate treasurer   Completed 2 yrs college   Married- from [employer]    children- grown both in [place]   Enjoys writing christian ed Administrator, sports   Social Determinants of Health   Financial Resource Strain: Low Risk  (12/23/2021)   Overall Financial Resource Strain (CARDIA)    Difficulty of Paying Living Expenses: Not hard at all  Food Insecurity: No Food Insecurity (12/29/2022)   Hunger Vital Sign    Worried About Running Out of Food in the Last Year: Never true    Ran Out of Food in the Last Year: Never true  Transportation Needs: No Transportation Needs (12/29/2022)   PRAPARE - Hydrologist (Medical): No    Lack of Transportation (Non-Medical): No  Physical Activity: Sufficiently Active (12/23/2021)   Exercise Vital Sign    Days of Exercise per Week: 2 days    Minutes of Exercise per Session: 120 min  Stress: No Stress Concern Present (12/23/2021)   [place]    Feeling of Stress : Not at all  Social Connections: [place] (12/23/2021)   Social Connection and Isolation Panel [NHANES]    Frequency of Communication with Friends and Family: Once a week    Frequency of Social Gatherings with Friends and Family: More than three times a week    Attends Religious Services: More than 4 times per year    Active Member of [employer] or Organizations: Yes     Attends Music therapist: More than 4 times per year    Marital Status: Married    Tobacco Counseling Counseling given: Not Answered   Clinical Intake:  Pre-visit preparation completed: Yes  Pain : No/denies pain  Diabetes: No  How often do you need to have someone help you when you read instructions, pamphlets, or other written materials from your doctor or pharmacy?: 1 -  Never  Activities of Daily Living    12/29/2022    8:29 AM  In your present state of health, do you have any difficulty performing the following activities:  Hearing? 0  Vision? 0  Difficulty concentrating or making decisions? 0  Walking or climbing stairs? 0  Dressing or bathing? 0  Doing errands, shopping? 0  Preparing Food and eating ? N  Using the Toilet? N  In the past six months, have you accidently leaked urine? Y  Do you have problems with loss of bowel control? N  Managing your Medications? N  Managing your Finances? N  Housekeeping or managing your Housekeeping? N    Patient Care Team: [name], NP as PCP - General (Internal Medicine)  Indicate any recent Medical Services you may have received from other than [facility] providers in the past year (date may be approximate).     Assessment:   This is a routine wellness examination for Prabhleen.  Hearing/Vision screen No results found.  Dietary issues and exercise activities discussed: Current Exercise Habits: Home exercise routine, Type of exercise: walking;strength training/weights, Time (Minutes): > 60 (2.5 hrs), Frequency (Times/Week): 2, Weekly Exercise (Minutes/Week): 0, Intensity: Mild, Exercise limited by: None identified   Goals Addressed   None    Depression Screen    12/29/2022    8:26 AM 08/22/2022    9:28 AM 02/10/2022    7:32 AM 12/23/2021    8:33 AM 10/22/2021    7:11 AM 02/05/2021    9:32 AM 10/08/2018    8:07 AM  PHQ 2/9 Scores  PHQ - 2 Score 0 0 0 0 0 0 0  PHQ- 9 Score      3 3    Fall Risk     12/29/2022    8:23 AM 08/22/2022    9:28 AM 02/10/2022    7:32 AM 12/23/2021    8:30 AM 10/22/2021    7:10 AM  Fall Risk   Falls in the past year? 0 0 0 0 0  Number falls in past yr: 0 0 0 0 0  Injury with Fall? 0 0 0 0 0  Risk for fall due to : No Fall Risks No Fall Risks   No Fall Risks  Follow up Falls evaluation completed Falls evaluation completed  Falls prevention discussed Falls evaluation completed    FALL RISK PREVENTION PERTAINING TO THE HOME:  Any stairs in or around the home? Yes  If so, are there any without handrails? No  Home free of loose throw rugs in walkways, pet beds, electrical cords, etc? Yes  Adequate lighting in your home to reduce risk of falls? Yes   ASSISTIVE DEVICES UTILIZED TO PREVENT FALLS:  Life alert? No  Use of a cane, walker or w/c? No  Grab bars in the bathroom? No  Shower chair or bench in shower? No  Elevated toilet seat or a handicapped toilet? No   TIMED UP AND GO:  Was the test performed? Yes .  Length of time to ambulate 10 feet: 6 sec.   Gait steady and fast without use of assistive device  Cognitive Function:        12/29/2022    8:37 AM  6CIT Screen  What Year? 0 points  What month? 0 points  What time? 0 points  Count back from 20 0 points  Months in reverse 0 points  Repeat phrase 0 points  Total Score 0 points    Immunizations Immunization History  Administered  Date(s) Administered   Fluad Quad(high Dose 65+) 08/07/2020, 08/09/2021, 08/05/2022   Hep A / Hep B 02/03/2022, 03/06/2022   Hepatitis B, adult 09/26/2015, 10/31/2015   Influenza Split 08/17/2012   Influenza,inj,Quad PF,6+ Mos 08/11/2013, 07/28/2014, 08/06/2015, 09/29/2016, 07/24/2017, 07/23/2018, 07/26/2019   Moderna SARS-COV2 Booster Vaccination 12/22/2019   Moderna Sars-Covid-2 Vaccination 09/07/2020, 03/11/2021   PFIZER(Purple Top)SARS-COV-2 Vaccination 12/23/2019, 01/13/2020, 04/01/2022   PNEUMOCOCCAL CONJUGATE-20 08/19/2021   PPD Test 09/26/2015,  10/31/2015, 11/30/2015   Pneumococcal Polysaccharide-23 08/12/2013, 02/14/2020   Respiratory Syncytial Virus Vaccine,Recomb Aduvanted(Arexvy) 08/18/2022   Tdap 12/22/2012, 12/04/2021   Zoster Recombinat (Shingrix) 05/16/2018, 07/23/2018, 07/26/2019   Zoster, Live 12/14/2014    TDAP status: Up to date  Flu Vaccine status: Up to date  Pneumococcal vaccine status: Up to date  Covid-19 vaccine status: Information provided on how to obtain vaccines.   Qualifies for Shingles Vaccine? Yes   Zostavax completed Yes   Shingrix Completed?: Yes  Screening Tests Health Maintenance  Topic Date Due   COVID-19 Vaccine (7 - 2023-24 season) 07/18/2022   Medicare Annual Wellness (AWV)  12/23/2022   MAMMOGRAM  08/07/2023   Fecal DNA (Cologuard)  11/03/2023   DTaP/Tdap/Td (3 - Td or Tdap) 12/05/2031   Pneumonia Vaccine 59+ Years old  Completed   INFLUENZA VACCINE  Completed   DEXA SCAN  Completed   Hepatitis C Screening  Completed   Zoster Vaccines- Shingrix  Completed   HPV VACCINES  Aged Out   COLONOSCOPY (Pts 45-41yr Insurance coverage will need to be confirmed)  Discontinued    Health Maintenance  Health Maintenance Due  Topic Date Due   COVID-19 Vaccine (7 - 2023-24 season) 07/18/2022   Medicare Annual Wellness (AWV)  12/23/2022    Colorectal cancer screening: Type of screening: Cologuard. Completed 11/02/20. Repeat every 3 years  Mammogram status: Completed 08/06/21. Repeat every year  Bone Density status: Completed 04/18/19. Results reflect: Bone density results: NORMAL. Repeat every 2 years.  Lung Cancer Screening: (Low Dose CT Chest recommended if Age 69-80years, 30 pack-year currently smoking OR have quit w/in 15years.) does not qualify.   Additional Screening:  Hepatitis C Screening: does qualify; Completed 03/07/22  Vision Screening: Recommended annual ophthalmology exams for early detection of glaucoma and other disorders of the eye. Is the patient up to date with  their annual eye exam?  Yes  Who is the provider or what is the name of the office in which the patient attends annual eye exams? [facility] If pt is not established with a provider, would they like to be referred to a provider to establish care? No .   Dental Screening: Recommended annual dental exams for proper oral hygiene  Community Resource Referral / Chronic Care Management: CRR required this visit?  No   CCM required this visit?  No      Plan:     I have personally reviewed and noted the following in the patient's chart:   Medical and social history Use of alcohol, tobacco or illicit drugs  Current medications and supplements including opioid prescriptions. Patient is not currently taking opioid prescriptions. Functional ability and status Nutritional status Physical activity Advanced directives List of other physicians Hospitalizations, surgeries, and ER visits in previous 12 months Vitals Screenings to include cognitive, depression, and falls Referrals and appointments  In addition, I have reviewed and discussed with patient certain preventive protocols, quality metrics, and best practice recommendations. A written personalized care plan for preventive services as well as general preventive health  recommendations were provided to patient.     Beatris Ship, [place]   12/29/2022   Nurse Notes: None

## 2022-12-29 NOTE — Patient Instructions (Signed)
[name] , Thank you for taking time to come for your Medicare Wellness Visit. I appreciate your ongoing commitment to your health goals. Please review the following plan we discussed and let me know if I can assist you in the future.   These are the goals we discussed:  Goals      Patient Stated     Continue going to the gym & lose some weight        This is a list of the screening recommended for you and due dates:  Health Maintenance  Topic Date Due   COVID-19 Vaccine (7 - 2023-24 season) 07/18/2022   Mammogram  08/07/2023   Cologuard (Stool DNA test)  11/03/2023   Medicare Annual Wellness Visit  12/30/2023   DTaP/Tdap/Td vaccine (3 - Td or Tdap) 12/05/2031   Pneumonia Vaccine  Completed   Flu Shot  Completed   DEXA scan (bone density measurement)  Completed   Hepatitis C Screening: USPSTF Recommendation to screen - Ages 20-79 yo.  Completed   Zoster (Shingles) Vaccine  Completed   HPV Vaccine  Aged Out   Colon Cancer Screening  Discontinued     Next appointment: Follow up in one year for your annual wellness visit.   Preventive Care 65 Years and Older, Female Preventive care refers to lifestyle choices and visits with your health care provider that can promote health and wellness. What does preventive care include? A yearly physical exam. This is also called an annual well check. Dental exams once or twice a year. Routine eye exams. Ask your health care provider how often you should have your eyes checked. Personal lifestyle choices, including: Daily care of your teeth and gums. Regular physical activity. Eating a healthy diet. Avoiding tobacco and drug use. Limiting alcohol use. Practicing safe sex. Taking low-dose aspirin every day. Taking vitamin and mineral supplements as recommended by your health care provider. What happens during an annual well check? The services and screenings done by your health care provider during your annual well check will  depend on your age, overall health, lifestyle risk factors, and family history of disease. Counseling  Your health care provider may ask you questions about your: Alcohol use. Tobacco use. Drug use. Emotional well-being. Home and relationship well-being. Sexual activity. Eating habits. History of falls. Memory and ability to understand (cognition). Work and work Statistician. Reproductive health. Screening  You may have the following tests or measurements: Height, weight, and BMI. Blood pressure. Lipid and cholesterol levels. These may be checked every 5 years, or more frequently if you are over 62 years old. Skin check. Lung cancer screening. You may have this screening every year starting at age 64 if you have a 30-pack-year history of smoking and currently smoke or have quit within the past 15 years. Fecal occult blood test (FOBT) of the stool. You may have this test every year starting at age 35. Flexible sigmoidoscopy or colonoscopy. You may have a sigmoidoscopy every 5 years or a colonoscopy every 10 years starting at age 75. Hepatitis C blood test. Hepatitis B blood test. Sexually transmitted disease (STD) testing. Diabetes screening. This is done by checking your blood sugar (glucose) after you have not eaten for a while (fasting). You may have this done every 1-3 years. Bone density scan. This is done to screen for osteoporosis. You may have this done starting at age 58. Mammogram. This may be done every 1-2 years. Talk to your health care provider about how often you should  have regular mammograms. Talk with your health care provider about your test results, treatment options, and if necessary, the need for more tests. Vaccines  Your health care provider may recommend certain vaccines, such as: Influenza vaccine. This is recommended every year. Tetanus, diphtheria, and acellular pertussis (Tdap, Td) vaccine. You may need a Td booster every 10 years. Zoster vaccine. You may  need this after age 20. Pneumococcal 13-valent conjugate (PCV13) vaccine. One dose is recommended after age 4. Pneumococcal polysaccharide (PPSV23) vaccine. One dose is recommended after age 47. Talk to your health care provider about which screenings and vaccines you need and how often you need them. This information is not intended to replace advice given to you by your health care provider. Make sure you discuss any questions you have with your health care provider. Document Released: 11/30/2015 Document Revised: 07/23/2016 Document Reviewed: 09/04/2015 Elsevier Interactive Patient Education  2017 [employer] Prevention in the Home Falls can cause injuries. They can happen to people of all ages. There are many things you can do to make your home safe and to help prevent falls. What can I do on the outside of my home? Regularly fix the edges of walkways and driveways and fix any cracks. Remove anything that might make you trip as you walk through a door, such as a raised step or threshold. Trim any bushes or trees on the path to your home. Use bright outdoor lighting. Clear any walking paths of anything that might make someone trip, such as rocks or tools. Regularly check to see if handrails are loose or broken. Make sure that both sides of any steps have handrails. Any raised decks and porches should have guardrails on the edges. Have any leaves, snow, or ice cleared regularly. Use sand or salt on walking paths during winter. Clean up any spills in your garage right away. This includes oil or grease spills. What can I do in the bathroom? Use night lights. Install grab bars by the toilet and in the tub and shower. Do not use towel bars as grab bars. Use non-skid mats or decals in the tub or shower. If you need to sit down in the shower, use a plastic, non-slip stool. Keep the floor dry. Clean up any water that spills on the floor as soon as it happens. Remove soap buildup in  the tub or shower regularly. Attach bath mats securely with double-sided non-slip rug tape. Do not have throw rugs and other things on the floor that can make you trip. What can I do in the bedroom? Use night lights. Make sure that you have a light by your bed that is easy to reach. Do not use any sheets or blankets that are too big for your bed. They should not hang down onto the floor. Have a firm chair that has side arms. You can use this for support while you get dressed. Do not have throw rugs and other things on the floor that can make you trip. What can I do in the kitchen? Clean up any spills right away. Avoid walking on wet floors. Keep items that you use a lot in easy-to-reach places. If you need to reach something above you, use a strong step stool that has a grab bar. Keep electrical cords out of the way. Do not use floor polish or wax that makes floors slippery. If you must use wax, use non-skid floor wax. Do not have throw rugs and other things on the floor  that can make you trip. What can I do with my stairs? Do not leave any items on the stairs. Make sure that there are handrails on both sides of the stairs and use them. Fix handrails that are broken or loose. Make sure that handrails are as long as the stairways. Check any carpeting to make sure that it is firmly attached to the stairs. Fix any carpet that is loose or worn. Avoid having throw rugs at the top or bottom of the stairs. If you do have throw rugs, attach them to the floor with carpet tape. Make sure that you have a light switch at the top of the stairs and the bottom of the stairs. If you do not have them, ask someone to add them for you. What else can I do to help prevent falls? Wear shoes that: Do not have high heels. Have rubber bottoms. Are comfortable and fit you well. Are closed at the toe. Do not wear sandals. If you use a stepladder: Make sure that it is fully opened. Do not climb a closed  stepladder. Make sure that both sides of the stepladder are locked into place. Ask someone to hold it for you, if possible. Clearly mark and make sure that you can see: Any grab bars or handrails. First and last steps. Where the edge of each step is. Use tools that help you move around (mobility aids) if they are needed. These include: Canes. Walkers. Scooters. Crutches. Turn on the lights when you go into a dark area. Replace any light bulbs as soon as they burn out. Set up your furniture so you have a clear path. Avoid moving your furniture around. If any of your floors are uneven, fix them. If there are any pets around you, be aware of where they are. Review your medicines with your doctor. Some medicines can make you feel dizzy. This can increase your chance of falling. Ask your doctor what other things that you can do to help prevent falls. This information is not intended to replace advice given to you by your health care provider. Make sure you discuss any questions you have with your health care provider. Document Released: 08/30/2009 Document Revised: 04/10/2016 Document Reviewed: 12/08/2014 Elsevier Interactive Patient Education  2017 [employer].

## 2022-12-31 ENCOUNTER — Encounter (HOSPITAL_BASED_OUTPATIENT_CLINIC_OR_DEPARTMENT_OTHER): Payer: Self-pay

## 2022-12-31 ENCOUNTER — Ambulatory Visit (HOSPITAL_BASED_OUTPATIENT_CLINIC_OR_DEPARTMENT_OTHER)
Admission: RE | Admit: 2022-12-31 | Discharge: 2022-12-31 | Disposition: A | Discharge status: Home / Self Care | Payer: Medicare Other | Source: Ambulatory Visit | Attending: Family | Admitting: Family

## 2022-12-31 DIAGNOSIS — Z1231 Encounter for screening mammogram for malignant neoplasm of breast: Secondary | ICD-10-CM | POA: Diagnosis not present

## 2023-01-15 ENCOUNTER — Other Ambulatory Visit: Payer: Self-pay | Admitting: Family

## 2023-01-15 NOTE — Telephone Encounter (Signed)
I sent a partial refill on her medication but she is overdue for follow up. Needs follow up visit please.

## 2023-01-16 ENCOUNTER — Telehealth: Payer: Self-pay | Admitting: Family

## 2023-01-16 NOTE — Telephone Encounter (Signed)
[facility] called to get a quantity clarification on pt's Truvada. Please Advise.

## 2023-01-16 NOTE — Telephone Encounter (Signed)
Pharmacy advised shot supply sent due to past due follow up

## 2023-01-19 NOTE — Telephone Encounter (Signed)
Called patient but no answer, left voice mail for patinet to call back and make appointment.  Spoke to pharmacist and made aware she was only getting 10 pills due to needing follow up and labs.

## 2023-01-20 ENCOUNTER — Telehealth: Payer: Self-pay | Admitting: Family

## 2023-01-20 NOTE — Telephone Encounter (Signed)
[place] with [facility]  called to advise that they received a prescription for Truvada -10 tablets but they cannot break open up the bottle so they can only dispense the entire bottle which has 30 tablets in it. Please call them to give them the okay to dispense. [phone]

## 2023-01-20 NOTE — Telephone Encounter (Signed)
Pharmacist advised to cancel prescription for 10 tablets. Notified patient has been advised she needs follow up asap for ov and labs.

## 2023-01-21 NOTE — Addendum Note (Signed)
Encounter addended by: Smitty Pluck on: 01/21/2023 2:38 PM  Actions taken: Imaging Exam begun

## 2023-01-28 ENCOUNTER — Encounter: Payer: Self-pay | Admitting: Family

## 2023-01-28 ENCOUNTER — Telehealth (INDEPENDENT_AMBULATORY_CARE_PROVIDER_SITE_OTHER): Payer: Medicare Other | Admitting: Family

## 2023-01-28 VITALS — BP 93/59 | HR 63

## 2023-01-28 DIAGNOSIS — I10 Essential (primary) hypertension: Secondary | ICD-10-CM

## 2023-01-28 DIAGNOSIS — Z113 Encounter for screening for infections with a predominantly sexual mode of transmission: Secondary | ICD-10-CM | POA: Diagnosis not present

## 2023-01-28 DIAGNOSIS — Z7251 High risk heterosexual behavior: Secondary | ICD-10-CM | POA: Diagnosis not present

## 2023-01-28 NOTE — Progress Notes (Addendum)
MyChart Video Visit    Virtual Visit via Video Note     Patient location: Home Patient and provider in visit Provider location: Office  I discussed the limitations of evaluation and management by telemedicine and the availability of in person appointments. The patient expressed understanding and agreed to proceed.  Visit Date: 01/28/2023  Today's healthcare provider: Lemont Fillers, NP     Subjective:    Patient ID: Melissa Grimes, female    DOB: 07-13-1956, 67 y.o.   MRN: [id number]  Chief Complaint  Patient presents with   Screen for STD    Follow up on screening for STD    HPI Patient is in today for a video visit.  Blood pressure: She does not check her blood pressure regularly. She is taking 25 mg metoprolol succinate daily PO and 30 mg lisinopril daily PO to manage it.   Eye Redness: She complains of redness in her right eye.   Truvada: She is taking 300 mg truvada daily PO for HIV prevention.     Past Medical History:  Diagnosis Date   Arthritis    Complication of anesthesia    difficulty urinating after   Hyperlipidemia    Hypertension     Past Surgical History:  Procedure Laterality Date   BUNIONECTOMY     CESAREAN SECTION     x 2   HERNIA REPAIR Left 11/18/1975   inguinal   OSTEOTOMY  1974 & 1975   toe removal Right 12/08/2021   right second toe   TOTAL HIP ARTHROPLASTY Right 01/18/2013   Procedure: Right TOTAL HIP ARTHROPLASTY ANTERIOR APPROACH;  Surgeon: Kathryne Hitch, MD;  Location: [facility];  Service: Orthopedics;  Laterality: Right;   TOTAL HIP ARTHROPLASTY Left 08/09/2013   Procedure: LEFT TOTAL HIP ARTHROPLASTY ANTERIOR APPROACH;  Surgeon: Kathryne Hitch, MD;  Location: [facility] OR;  Service: Orthopedics;  Laterality: Left;    Family History  Problem Relation Age of Onset   Arthritis Mother        osteoarthritis   Hyperlipidemia Mother    Heart disease Mother    Hypertension Mother    Hypothyroidism Mother     Hyperlipidemia Father    Heart disease Father    Stroke Father    Parkinson's disease Father    Cancer Cousin        breast   Cancer Maternal Grandfather        lung   Obesity Sister        "overweight"   Hypertension Sister    Varicose Veins Child    Asthma Child    Migraines Child     Social History   Socioeconomic History   Marital status: Married    Spouse name: Not on file   Number of children: 2   Years of education: Not on file   Highest education level: Not on file  Occupational History   Not on file  Tobacco Use   Smoking status: Never   Smokeless tobacco: Never  Vaping Use   Vaping Use: Never used  Substance and Sexual Activity   Alcohol use: Yes    Alcohol/week: 3.0 standard drinks of alcohol    Types: 3 Glasses of wine per week   Drug use: No   Sexual activity: Yes  Other Topics Concern   Not on file  Social History Narrative   Graphic designer   Completed 2 yrs college   Married- from [employer]    children- grown both  in [place]   Enjoys writing christian ed Scientist, clinical (histocompatibility and immunogenetics)   Social Determinants of Health   Financial Resource Strain: Low Risk  (12/23/2021)   Overall Financial Resource Strain (CARDIA)    Difficulty of Paying Living Expenses: Not hard at all  Food Insecurity: No Food Insecurity (12/29/2022)   Hunger Vital Sign    Worried About Running Out of Food in the Last Year: Never true    Ran Out of Food in the Last Year: Never true  Transportation Needs: No Transportation Needs (12/29/2022)   PRAPARE - Administrator, Civil Service (Medical): No    Lack of Transportation (Non-Medical): No  Physical Activity: Sufficiently Active (12/23/2021)   Exercise Vital Sign    Days of Exercise per Week: 2 days    Minutes of Exercise per Session: 120 min  Stress: No Stress Concern Present (12/23/2021)   [employer] of Occupational Health - Occupational Stress Questionnaire    Feeling of Stress : Not at all  Social Connections: Socially  Integrated (12/23/2021)   Social Connection and Isolation Panel [NHANES]    Frequency of Communication with Friends and Family: Once a week    Frequency of Social Gatherings with Friends and Family: More than three times a week    Attends Religious Services: More than 4 times per year    Active Member of [employer] or Organizations: Yes    Attends Engineer, structural: More than 4 times per year    Marital Status: Married  Catering manager Violence: Not At Risk (12/29/2022)   Humiliation, Afraid, Rape, and Kick questionnaire    Fear of Current or Ex-Partner: No    Emotionally Abused: No    Physically Abused: No    Sexually Abused: No    Outpatient Medications Prior to Visit  Medication Sig Dispense Refill   aspirin EC 81 MG tablet Take 81 mg by mouth daily.     atorvastatin (LIPITOR) 20 MG tablet TAKE 1 TABLET BY MOUTH DAILY 100 tablet 2   Calcium Citrate-Vitamin D 500-400 MG-UNIT CHEW Chew 1 tablet by mouth 2 (two) times daily.      emtricitabine-tenofovir (TRUVADA) 200-300 MG tablet Take 1 tablet by mouth once daily 10 tablet 0   lisinopril (ZESTRIL) 30 MG tablet TAKE 1 TABLET BY MOUTH DAILY 100 tablet 2   Multiple Vitamin (MULTIVITAMIN) tablet Take 1 tablet by mouth daily.     Multiple Vitamins-Minerals (PRESERVISION AREDS PO) Take by mouth.     Omega-3 Fatty Acids (FISH OIL) 1000 MG CAPS Take 1 capsule by mouth daily.     metoprolol succinate (TOPROL-XL) 25 MG 24 hr tablet TAKE 1 TABLET BY MOUTH ONCE  DAILY 100 tablet 2   No facility-administered medications prior to visit.    Allergies  Allergen Reactions   Achromycin [Tetracycline] Hives    ROS See     Objective:    Physical Exam HENT:     Head:     Comments: Right scleral hemorrhage  Eyes:     Comments: Scleral hemorrhage    Pulmonary:     Effort: Pulmonary effort is normal.  Neurological:     Mental Status: She is alert and oriented to person, place, and time.  Psychiatric:        Mood and Affect: Mood  normal.        Behavior: Behavior normal.        Thought Content: Thought content normal.        Judgment: Judgment normal.  BP (!) 93/59   Pulse 63   LMP 11/17/2010  Wt Readings from Last 3 Encounters:  12/29/22 136 lb 6.4 oz (61.9 kg)  08/22/22 135 lb 3.2 oz (61.3 kg)  07/23/22 138 lb 8 oz (62.8 kg)       Assessment & Plan:  Primary hypertension Assessment & Plan: BP Readings from Last 3 Encounters:  12/29/22 123/86  08/22/22 122/80  07/23/22 102/62   Stable on metoprolol and lisinopril based on last bp check.  I have asked her to check bp at home today and send me her reading via mychart.   Addendum: home bp 93/59.  Advised pt to d/c metoprolol and send me some updated readings in a few days. Continue lisinopril.   Orders: -     Comprehensive metabolic panel; Future  Routine screening for STI (sexually transmitted infection) -     HIV Antibody (routine testing w rflx); Future -     RPR; Future -     Herpes Simplex Virus 2(IgG)w/rflx to HSV2 Inhibition; Future  High risk sexual behavior, unspecified type Assessment & Plan: Await HIV results, then will resume Truvada.       I discussed the assessment and treatment plan with the patient. The patient was provided an opportunity to ask questions and all were answered. The patient agreed with the plan and demonstrated an understanding of the instructions.   The patient was advised to call back or seek an in-person evaluation if the symptoms worsen or if the condition fails to improve as anticipated.  Lemont Fillers, NP [facility] [phone] (phone) [phone] (fax)  [facility]   Mercer Pod as a scribe for Lemont Fillers, NP.,have documented all relevant documentation on the behalf of Lemont Fillers, NP,as directed by  Lemont Fillers, NP while in the presence of Lemont Fillers, NP.

## 2023-01-28 NOTE — Assessment & Plan Note (Addendum)
BP Readings from Last 3 Encounters:  12/29/22 123/86  08/22/22 122/80  07/23/22 102/62   Stable on metoprolol and lisinopril based on last bp check.  I have asked her to check bp at home today and send me her reading via mychart.   Addendum: home bp 93/59.  Advised pt to d/c metoprolol and send me some updated readings in a few days. Continue lisinopril.

## 2023-01-28 NOTE — Assessment & Plan Note (Signed)
Await HIV results, then will resume Truvada.

## 2023-01-29 NOTE — Addendum Note (Signed)
Addended by: [name] on: 01/29/2023 12:14 PM   Modules accepted: Orders

## 2023-01-30 ENCOUNTER — Other Ambulatory Visit (INDEPENDENT_AMBULATORY_CARE_PROVIDER_SITE_OTHER): Payer: Medicare Other

## 2023-01-30 DIAGNOSIS — Z113 Encounter for screening for infections with a predominantly sexual mode of transmission: Secondary | ICD-10-CM

## 2023-01-30 DIAGNOSIS — I10 Essential (primary) hypertension: Secondary | ICD-10-CM

## 2023-01-30 LAB — COMPREHENSIVE METABOLIC PANEL
ALT: 27 U/L (ref 0–35)
AST: 27 U/L (ref 0–37)
Albumin: 4.3 g/dL (ref 3.5–5.2)
Alkaline Phosphatase: 87 U/L (ref 39–117)
BUN: 22 mg/dL (ref 6–23)
CO2: 30 mEq/L (ref 19–32)
Calcium: 9.3 mg/dL (ref 8.4–10.5)
Chloride: 103 mEq/L (ref 96–112)
Creatinine, Ser: 0.72 mg/dL (ref 0.40–1.20)
GFR: 86.02 mL/min (ref >60.00)
Glucose, Bld: 72 mg/dL (ref 70–99)
Potassium: 4.5 mEq/L (ref 3.5–5.1)
Sodium: 140 mEq/L (ref 135–145)
Total Bilirubin: 0.4 mg/dL (ref 0.2–1.2)
Total Protein: 6.8 g/dL (ref 6.0–8.3)

## 2023-02-02 ENCOUNTER — Other Ambulatory Visit: Payer: Self-pay | Admitting: Family

## 2023-02-02 LAB — RPR: RPR Ser Ql: NONREACTIVE

## 2023-02-02 LAB — HERPES SIMPLEX VIRUS 2(IGG) W/ REFLEX TO HSV2 INHIBITION: HSV 2 Glycoprotein G Ab, IgG: <0.9 index

## 2023-02-02 LAB — HIV ANTIBODY (ROUTINE TESTING W REFLEX): HIV 1&2 Ab, 4th Generation: NONREACTIVE

## 2023-02-02 MED ORDER — EMTRICITABINE-TENOFOVIR DF 200-300 MG PO TABS: 1.0000 | ORAL_TABLET | Freq: Every day | ORAL | 0 refills | Status: DC

## 2023-02-12 ENCOUNTER — Ambulatory Visit (INDEPENDENT_AMBULATORY_CARE_PROVIDER_SITE_OTHER): Payer: Medicare Other | Admitting: Family

## 2023-02-12 ENCOUNTER — Other Ambulatory Visit (HOSPITAL_COMMUNITY)
Admission: RE | Admit: 2023-02-12 | Discharge: 2023-02-12 | Disposition: A | Discharge status: Home / Self Care | Payer: Medicare Other | Source: Ambulatory Visit | Attending: Family | Admitting: Family

## 2023-02-12 ENCOUNTER — Encounter: Payer: Self-pay | Admitting: Family

## 2023-02-12 VITALS — BP 106/76 | HR 91 | Temp 97.6°F | Resp 16 | Wt 136.0 lb

## 2023-02-12 DIAGNOSIS — Z0000 Encounter for general adult medical examination without abnormal findings: Secondary | ICD-10-CM

## 2023-02-12 DIAGNOSIS — Z780 Asymptomatic menopausal state: Secondary | ICD-10-CM

## 2023-02-12 DIAGNOSIS — Z113 Encounter for screening for infections with a predominantly sexual mode of transmission: Secondary | ICD-10-CM | POA: Diagnosis present

## 2023-02-12 DIAGNOSIS — I10 Essential (primary) hypertension: Secondary | ICD-10-CM

## 2023-02-12 DIAGNOSIS — E785 Hyperlipidemia, unspecified: Secondary | ICD-10-CM

## 2023-02-12 DIAGNOSIS — E042 Nontoxic multinodular goiter: Secondary | ICD-10-CM | POA: Diagnosis not present

## 2023-02-12 LAB — COMPREHENSIVE METABOLIC PANEL
ALT: 28 U/L (ref 0–35)
AST: 32 U/L (ref 0–37)
Albumin: 4.4 g/dL (ref 3.5–5.2)
Alkaline Phosphatase: 88 U/L (ref 39–117)
BUN: 15 mg/dL (ref 6–23)
CO2: 29 mEq/L (ref 19–32)
Calcium: 9.6 mg/dL (ref 8.4–10.5)
Chloride: 104 mEq/L (ref 96–112)
Creatinine, Ser: 0.78 mg/dL (ref 0.40–1.20)
GFR: 78.12 mL/min (ref >60.00)
Glucose, Bld: 92 mg/dL (ref 70–99)
Potassium: 4.5 mEq/L (ref 3.5–5.1)
Sodium: 138 mEq/L (ref 135–145)
Total Bilirubin: 0.5 mg/dL (ref 0.2–1.2)
Total Protein: 6.7 g/dL (ref 6.0–8.3)

## 2023-02-12 LAB — LIPID PANEL
Cholesterol: 132 mg/dL (ref 0–200)
HDL: 55.8 mg/dL (ref >39.00)
LDL Cholesterol: 63 mg/dL (ref 0–99)
NonHDL: 75.96
Total CHOL/HDL Ratio: 2
Triglycerides: 65 mg/dL (ref 0.0–149.0)
VLDL: 13 mg/dL (ref 0.0–40.0)

## 2023-02-12 LAB — TSH: TSH: 1.07 u[IU]/mL (ref 0.35–5.50)

## 2023-02-12 MED ORDER — LISINOPRIL 20 MG PO TABS: 20.0000 mg | ORAL_TABLET | Freq: Every day | ORAL | 1 refills | Status: DC

## 2023-02-12 NOTE — Progress Notes (Signed)
Subjective:     Patient ID: Melissa Grimes, female    DOB: 1956/04/09, 67 y.o.   MRN: [id number]  Chief Complaint  Patient presents with   Annual Exam    Here for annual physical    Hypertension    Was advised to dc metoprolol 3/13 due to low BP reading at home.     Hypertension Pertinent negatives include no headaches.   Patient is in today for cpx.  Patient presents today for complete physical.  Immunizations:up to date Diet:healthy Exercise: goes to the gym regularly Colonoscopy: cologuard 11/02/20, due 11/03/23 Dexa: 04/18/2019 Pap Smear:05/01/2022 Mammogram: 12/31/2022 Vision:  up to date Dental: scheduled  Last visit BP was low.  We discontinued her metoprolol and continued lisinopril.   BP Readings from Last 3 Encounters:  02/12/23 106/76  01/29/23 (!) 93/59  12/29/22 123/86     Health Maintenance Due  Topic Date Due   COVID-19 Vaccine (7 - 2023-24 season) 07/18/2022    Past Medical History:  Diagnosis Date   Arthritis    Complication of anesthesia    difficulty urinating after   Hyperlipidemia    Hypertension    Macular degeneration    Trochanteric bursitis, right hip 10/11/2018    Past Surgical History:  Procedure Laterality Date   BUNIONECTOMY     CESAREAN SECTION     x 2   HERNIA REPAIR Left 11/18/1975   inguinal   OSTEOTOMY  1974 & 1975   toe removal Right 12/08/2021   right second toe   TOTAL HIP ARTHROPLASTY Right 01/18/2013   Procedure: Right TOTAL HIP ARTHROPLASTY ANTERIOR APPROACH;  Surgeon: Mcarthur Rossetti, MD;  Location: [facility];  Service: Orthopedics;  Laterality: Right;   TOTAL HIP ARTHROPLASTY Left 08/09/2013   Procedure: LEFT TOTAL HIP ARTHROPLASTY ANTERIOR APPROACH;  Surgeon: Mcarthur Rossetti, MD;  Location: [facility];  Service: Orthopedics;  Laterality: Left;    Family History  Problem Relation Age of Onset   Arthritis Mother        osteoarthritis   Hyperlipidemia Mother    Heart disease Mother     Hypertension Mother    Hypothyroidism Mother    Hyperlipidemia Father    Heart disease Father    Stroke Father    Parkinson's disease Father    Obesity Sister        "overweight"   Hypertension Sister    Cancer Maternal Grandfather        lung   Varicose Veins Child    Asthma Child    Migraines Child    Cancer Cousin        breast    Social History   Socioeconomic History   Marital status: Married    Spouse name: Not on file   Number of children: 2   Years of education: Not on file   Highest education level: Not on file  Occupational History   Not on file  Tobacco Use   Smoking status: Never   Smokeless tobacco: Never  Vaping Use   Vaping Use: Never used  Substance and Sexual Activity   Alcohol use: Yes    Alcohol/week: 3.0 standard drinks of alcohol    Types: 3 Glasses of wine per week    Comment: 1-3   Drug use: No   Sexual activity: Yes    Partners: Male  Other Topics Concern   Not on file  Social History Narrative   Corporate treasurer   Completed 2 yrs college  Married- from [employer]    children- grown both in [place]   Enjoys writing christian ed Administrator, sports   Social Determinants of Health   Financial Resource Strain: Low Risk  (12/23/2021)   Overall Financial Resource Strain (CARDIA)    Difficulty of Paying Living Expenses: Not hard at all  Food Insecurity: No Food Insecurity (12/29/2022)   Hunger Vital Sign    Worried About Running Out of Food in the Last Year: Never true    Ran Out of Food in the Last Year: Never true  Transportation Needs: No Transportation Needs (12/29/2022)   PRAPARE - Hydrologist (Medical): No    Lack of Transportation (Non-Medical): No  Physical Activity: Sufficiently Active (12/23/2021)   Exercise Vital Sign    Days of Exercise per Week: 2 days    Minutes of Exercise per Session: 120 min  Stress: No Stress Concern Present (12/23/2021)   [place]    Feeling of Stress : Not at all  Social Connections: [place] (12/23/2021)   Social Connection and Isolation Panel [NHANES]    Frequency of Communication with Friends and Family: Once a week    Frequency of Social Gatherings with Friends and Family: More than three times a week    Attends Religious Services: More than 4 times per year    Active Member of [employer] or Organizations: Yes    Attends Music therapist: More than 4 times per year    Marital Status: Married  Human resources officer Violence: Not At Risk (12/29/2022)   Humiliation, Afraid, Rape, and Kick questionnaire    Fear of Current or Ex-Partner: No    Emotionally Abused: No    Physically Abused: No    Sexually Abused: No    Outpatient Medications Prior to Visit  Medication Sig Dispense Refill   aspirin EC 81 MG tablet Take 81 mg by mouth daily.     atorvastatin (LIPITOR) 20 MG tablet TAKE 1 TABLET BY MOUTH DAILY 100 tablet 2   Calcium Citrate-Vitamin D 500-400 MG-UNIT CHEW Chew 1 tablet by mouth 2 (two) times daily.      emtricitabine-tenofovir (TRUVADA) 200-300 MG tablet Take 1 tablet by mouth daily. 90 tablet 0   Multiple Vitamin (MULTIVITAMIN) tablet Take 1 tablet by mouth daily.     Multiple Vitamins-Minerals (PRESERVISION AREDS PO) Take by mouth.     Omega-3 Fatty Acids (FISH OIL) 1000 MG CAPS Take 1 capsule by mouth daily.     lisinopril (ZESTRIL) 30 MG tablet TAKE 1 TABLET BY MOUTH DAILY 100 tablet 2   No facility-administered medications prior to visit.    Allergies  Allergen Reactions   Achromycin [Tetracycline] Hives    Review of Systems  Constitutional:  Negative for weight loss.  HENT:  Negative for hearing loss.   Eyes:        Gaze palsy  Gastrointestinal:  Negative for constipation and diarrhea.       Hiccups most night x 2 months  Genitourinary:  Negative for dysuria and frequency.  Musculoskeletal:  Positive for myalgias (has some right upper leg soreness  that she believes is muscle.). Negative for joint pain.  Skin:  Negative for rash.  Neurological:  Negative for headaches.  Psychiatric/Behavioral:         Denies depression/anxiety       Objective:    Physical Exam  BP 106/76 (BP Location: Left Arm, Patient Position: Sitting, Cuff Size:  Small)   Pulse 91   Temp 97.6 F (36.4 C) (Oral)   Resp 16   Wt 136 lb (61.7 kg)   LMP 11/17/2010   SpO2 99%   BMI 24.09 kg/m  Wt Readings from Last 3 Encounters:  02/12/23 136 lb (61.7 kg)  12/29/22 136 lb 6.4 oz (61.9 kg)  08/22/22 135 lb 3.2 oz (61.3 kg)   Physical Exam  Constitutional: She is oriented to person, place, and time. She appears well-developed and well-nourished. No distress.  HENT:  Head: Normocephalic and atraumatic.  Right Ear: Tympanic membrane and ear canal normal.  Left Ear: Tympanic membrane and ear canal normal.  Mouth/Throat: Oropharynx is clear and moist.  Eyes: Pupils are equal, round, and reactive to light. No scleral icterus.  Neck: Normal range of motion. No thyromegaly present.  Cardiovascular: Normal rate and regular rhythm.   No murmur heard. Pulmonary/Chest: Effort normal and breath sounds normal. No respiratory distress. He has no wheezes. She has no rales. She exhibits no tenderness.  Abdominal: Soft. Bowel sounds are normal. She exhibits no distension and no mass. There is no tenderness. There is no rebound and no guarding.  Musculoskeletal: She exhibits no edema.  Lymphadenopathy:    She has no cervical adenopathy.  Neurological: She is alert and oriented to person, place, and time. She has normal patellar reflexes. She exhibits normal muscle tone. Coordination normal.  Skin: Skin is warm and dry.  Psychiatric: She has a normal mood and affect. Her behavior is normal. Judgment and thought content normal.  GYN: normal external vulva           Assessment & Plan:       Assessment & Plan:   Problem List Items Addressed This Visit        Unprioritized   Preventative health care    Continue healthy diet, exercise.  Update dexa. Cologuard will be due in December.  Pap and mammo up to date.       Multinodular goiter   Relevant Orders   TSH   Hyperlipidemia   Relevant Medications   lisinopril (ZESTRIL) 20 MG tablet   Other Relevant Orders   Lipid panel   HTN (hypertension)    I think that her BP is still a bit overtreated. She reports some sbp's in the 90's at home even after discontinuation of metoprolol. Will decrease her lisinopril from 30mg  to 20mg  once daily.      Relevant Medications   lisinopril (ZESTRIL) 20 MG tablet   Other Relevant Orders   Comp Met (CMET)   Other Visit Diagnoses     Postmenopausal estrogen deficiency    -  Primary   Relevant Orders   DG Bone Density   Routine screening for STI (sexually transmitted infection)       Relevant Orders   Cervicovaginal ancillary only( [place])       I have discontinued [place] lisinopril. I am also having her start on lisinopril. Additionally, I am having her maintain her calcium citrate-vitamin D, aspirin EC, Fish Oil, multivitamin, Multiple Vitamins-Minerals (PRESERVISION AREDS PO), atorvastatin, and emtricitabine-tenofovir.  Meds ordered this encounter  Medications   lisinopril (ZESTRIL) 20 MG tablet    Sig: Take 1 tablet (20 mg total) by mouth daily.    Dispense:  90 tablet    Refill:  1    Order Specific Question:   Supervising Provider    Answer:   Penni Homans A [[id number]]

## 2023-02-12 NOTE — Assessment & Plan Note (Signed)
I think that her BP is still a bit overtreated. She reports some sbp's in the 90's at home even after discontinuation of metoprolol. Will decrease her lisinopril from 30mg  to 20mg  once daily.

## 2023-02-12 NOTE — Assessment & Plan Note (Signed)
Continue healthy diet, exercise.  Update dexa. Cologuard will be due in December.  Pap and mammo up to date.

## 2023-02-13 ENCOUNTER — Encounter: Payer: Self-pay | Admitting: Family

## 2023-02-13 LAB — CERVICOVAGINAL ANCILLARY ONLY
Chlamydia: POSITIVE — AB
Comment: NEGATIVE
Comment: NEGATIVE
Comment: NORMAL
Neisseria Gonorrhea: NEGATIVE
Trichomonas: NEGATIVE

## 2023-02-14 ENCOUNTER — Telehealth: Payer: Self-pay | Admitting: Family

## 2023-02-14 MED ORDER — AZITHROMYCIN 250 MG PO TABS: 1000.0000 mg | ORAL_TABLET | Freq: Once | ORAL | 0 refills | Status: AC

## 2023-02-14 NOTE — Telephone Encounter (Signed)
Can you please submit her chlamydia testing to the health department?

## 2023-02-14 NOTE — Telephone Encounter (Signed)
Please advise pt that her chlamydia testing is positive.  I would like her to Take azithromycin x 1.  This med choice is due to her allergy to tetracycline. .  Avoid intercourse until she has completed the treatment. She should notify all partners so that they can also be treated. I will send an rx for her husband as well but he will get different antibiotic because he is not allergic to tetracycline.

## 2023-02-17 NOTE — Telephone Encounter (Signed)
Chlamydia results/report sent to [facility] Department. Fax confirmation received.

## 2023-02-19 ENCOUNTER — Telehealth (HOSPITAL_BASED_OUTPATIENT_CLINIC_OR_DEPARTMENT_OTHER): Payer: Self-pay

## 2023-03-17 ENCOUNTER — Other Ambulatory Visit: Payer: Self-pay | Admitting: Family

## 2023-04-06 ENCOUNTER — Ambulatory Visit: Payer: Medicare Other | Admitting: Family Medicine

## 2023-04-06 ENCOUNTER — Encounter: Payer: Self-pay | Admitting: Family Medicine

## 2023-04-06 VITALS — BP 115/78 | HR 70 | Ht 63.0 in | Wt 132.0 lb

## 2023-04-06 DIAGNOSIS — L237 Allergic contact dermatitis due to plants, except food: Secondary | ICD-10-CM | POA: Diagnosis not present

## 2023-04-06 MED ORDER — METHYLPREDNISOLONE ACETATE 80 MG/ML IJ SUSP
80.0000 mg | Freq: Once | INTRAMUSCULAR | Status: AC
  Administered 2023-04-06: 80 mg via INTRAMUSCULAR

## 2023-04-06 NOTE — Progress Notes (Signed)
   Acute Office Visit  Subjective:     Patient ID: Melissa Grimes, female    DOB: 1956/06/04, 67 y.o.   MRN: [id number]  Chief Complaint  Patient presents with   Poison Ivy     Patient is in today for poison oak/ivy rash.   Patient states she was working in the yard week before last and about 10 days ago she started noticing and itchy rash consistent with poison oak/ivy. States it has spread in patches across both of her arms and upper legs. Rash is red, raised vesicles with severe itching. She has been taking a daily antihistamine, but has needed to use benadryl a few nights as well for the itching. States she tried Desitin and that seemed to help dry up the vesicles, but they haven't gone away and are extremely itchy.  Denies any systemic symptoms, spreading erythema, streaking, purulent drainage.    ROS All review of systems negative except what is listed in the HPI      Objective:    BP 115/78   Pulse 70   Ht 5\' 3"  (1.6 m)   Wt 132 lb (59.9 kg)   LMP 11/17/2010   SpO2 98%   BMI 23.38 kg/m    Physical Exam Vitals reviewed.  Constitutional:      Appearance: Normal appearance.  Cardiovascular:     Rate and Rhythm: Normal rate and regular rhythm.     Pulses: Normal pulses.     Heart sounds: Normal heart sounds.  Pulmonary:     Effort: Pulmonary effort is normal.     Breath sounds: Normal breath sounds.  Skin:    General: Skin is warm and dry.     Comments: Erythematous papular rash in linear/clustered pattern to bilateral arms and upper legs, no spreading erythema, streaking, edema, induration/fluctuance, drainage  Neurological:     Mental Status: She is alert and oriented to person, place, and time.  Psychiatric:        Mood and Affect: Mood normal.        Behavior: Behavior normal.        Thought Content: Thought content normal.        Judgment: Judgment normal.     No results found for any visits on 04/06/23.      Assessment & Plan:   Problem List  Items Addressed This Visit   None Visit Diagnoses     Poison oak dermatitis    -  Primary   Relevant Medications   methylPREDNISolone acetate (DEPO-MEDROL) injection 80 mg (Completed)      Starting with a steroid injection today. Continue your topical supportive measures. If not sufficient management, please let me know and we can do an oral steroid taper.  Keep lesions clean and monitor for signs of secondary infection      Meds ordered this encounter  Medications   methylPREDNISolone acetate (DEPO-MEDROL) injection 80 mg    Return if symptoms worsen or fail to improve.  Clayborne Dana, NP

## 2023-04-06 NOTE — Patient Instructions (Signed)
Starting with a steroid injection today. Continue your topical supportive measures. If not sufficient management, please let me know and we can do an oral steroid taper.  Keep lesions clean and monitor for signs of secondary infection

## 2023-04-18 ENCOUNTER — Other Ambulatory Visit: Payer: Self-pay | Admitting: Family

## 2023-05-13 ENCOUNTER — Ambulatory Visit (INDEPENDENT_AMBULATORY_CARE_PROVIDER_SITE_OTHER): Payer: Medicare Other | Admitting: Family Medicine

## 2023-05-13 ENCOUNTER — Encounter: Payer: Self-pay | Admitting: Family Medicine

## 2023-05-13 ENCOUNTER — Other Ambulatory Visit (HOSPITAL_COMMUNITY)
Admission: RE | Admit: 2023-05-13 | Discharge: 2023-05-13 | Disposition: A | Discharge status: Home / Self Care | Payer: Medicare Other | Source: Ambulatory Visit | Attending: Family Medicine | Admitting: Family Medicine

## 2023-05-13 VITALS — BP 112/80 | HR 54 | Ht 63.0 in | Wt 132.0 lb

## 2023-05-13 DIAGNOSIS — Z01419 Encounter for gynecological examination (general) (routine) without abnormal findings: Secondary | ICD-10-CM | POA: Diagnosis not present

## 2023-05-13 DIAGNOSIS — Z113 Encounter for screening for infections with a predominantly sexual mode of transmission: Secondary | ICD-10-CM | POA: Diagnosis not present

## 2023-05-13 NOTE — Progress Notes (Signed)
ANNUAL EXAM Patient name: Melissa Grimes MRN [id number]  Date of birth: May 28, 1956 Chief Complaint:   Annual Exam  History of Present Illness:   Melissa Grimes is a 67 y.o.  [id number]  female  being seen today for a routine annual exam.  Current complaints: none. Wants screening for STIs  Patient's last menstrual period was 11/17/2010.    Last pap 2023. Results were: NILM w/ HRHPV negative. H/O abnormal pap: yes +HPV in 2021 Last mammogram: 12/2022. Results were: normal.  Last colonoscopy: 2008. Results were: normal. Family h/o colorectal cancer: no     02/12/2023    8:06 AM 12/29/2022    8:26 AM 08/22/2022    9:28 AM 02/10/2022    7:32 AM 12/23/2021    8:33 AM  Depression screen PHQ 2/9  Decreased Interest 0 0 0 0 0  Down, Depressed, Hopeless 0 0 0 0 0  PHQ - 2 Score 0 0 0 0 0  Altered sleeping 0      Tired, decreased energy 0      Change in appetite 0      Feeling bad or failure about yourself  0      Trouble concentrating 0      Moving slowly or fidgety/restless 0      Suicidal thoughts 0      PHQ-9 Score 0      Difficult doing work/chores Not difficult at all             No data to display           Review of Systems:   Pertinent items are noted in HPI Denies any headaches, blurred vision, fatigue, shortness of breath, chest pain, abdominal pain, abnormal vaginal discharge/itching/odor/irritation, problems with periods, bowel movements, urination, or intercourse unless otherwise stated above. Pertinent History Reviewed:  Reviewed past medical,surgical, social and family history.  Reviewed problem list, medications and allergies. Physical Assessment:   Vitals:   05/13/23 0916  BP: 112/80  Pulse: (!) 54  Weight: 132 lb (59.9 kg)  Height: 5\' 3"  (1.6 m)  Body mass index is 23.38 kg/m.        Physical Examination:   General appearance - well appearing, and in no distress  Mental status - alert, oriented to person, place, and time  Psych:  She has a  normal mood and affect  Skin - warm and dry, normal color, no suspicious lesions noted  Chest - effort normal, all lung fields clear to auscultation bilaterally  Heart - normal rate and regular rhythm  Neck:  midline trachea, no thyromegaly or nodules  Breasts - breasts appear normal, no suspicious masses, no skin or nipple changes or axillary nodes  Abdomen - soft, nontender, nondistended, no masses or organomegaly  Pelvic - VULVA: normal appearing vulva with no masses, tenderness or lesions  VAGINA: normal appearing vagina with normal color and discharge, no lesions  CERVIX: normal appearing cervix without discharge or lesions, no CMT  Thin prep pap is not done  UTERUS: uterus is felt to be normal size, shape, consistency and nontender   ADNEXA: No adnexal masses or tenderness noted.  Extremities:  No swelling or varicosities noted  Chaperone present for exam  Assessment & Plan:  1. Well woman exam with routine gynecological exam - CBC - Comprehensive metabolic panel - TSH - HIV antibody (with reflex) - Hepatitis C Antibody - Hepatitis B Surface AntiGEN - RPR - Cervicovaginal ancillary only( [place])  2. Routine screening  for STI (sexually transmitted infection) - HIV antibody (with reflex) - Hepatitis C Antibody - Hepatitis B Surface AntiGEN - RPR - Cervicovaginal ancillary only( [place])   Labs/procedures today:  Patient has DEXA scan ordered. Will get it done. No orders of the defined types were placed in this encounter.   Meds: No orders of the defined types were placed in this encounter.   Follow-up: No follow-ups on file.  Levie Heritage, DO 05/13/2023 9:59 AM

## 2023-05-14 LAB — CBC
Hematocrit: 40.8 % (ref 34.0–46.6)
Hemoglobin: 13.8 g/dL (ref 11.1–15.9)
MCH: 32.9 pg (ref 26.6–33.0)
MCHC: 33.8 g/dL (ref 31.5–35.7)
MCV: 97 fL (ref 79–97)
Platelets: 282 10*3/uL (ref 150–450)
RBC: 4.2 x10E6/uL (ref 3.77–5.28)
RDW: 11.9 % (ref 11.7–15.4)
WBC: 4.1 10*3/uL (ref 3.4–10.8)

## 2023-05-14 LAB — COMPREHENSIVE METABOLIC PANEL
ALT: 24 IU/L (ref 0–32)
AST: 29 IU/L (ref 0–40)
Albumin: 4.3 g/dL (ref 3.9–4.9)
Alkaline Phosphatase: 104 IU/L (ref 44–121)
BUN/Creatinine Ratio: 28 (ref 12–28)
BUN: 20 mg/dL (ref 8–27)
Bilirubin Total: 0.4 mg/dL (ref 0.0–1.2)
CO2: 25 mmol/L (ref 20–29)
Calcium: 9.4 mg/dL (ref 8.7–10.3)
Chloride: 102 mmol/L (ref 96–106)
Creatinine, Ser: 0.72 mg/dL (ref 0.57–1.00)
Globulin, Total: 2 g/dL (ref 1.5–4.5)
Glucose: 87 mg/dL (ref 70–99)
Potassium: 4.5 mmol/L (ref 3.5–5.2)
Sodium: 138 mmol/L (ref 134–144)
Total Protein: 6.3 g/dL (ref 6.0–8.5)
eGFR: 91 mL/min/{1.73_m2} (ref >59)

## 2023-05-14 LAB — CERVICOVAGINAL ANCILLARY ONLY
Chlamydia: NEGATIVE
Comment: NEGATIVE
Comment: NEGATIVE
Comment: NORMAL
Neisseria Gonorrhea: NEGATIVE
Trichomonas: NEGATIVE

## 2023-05-14 LAB — TSH: TSH: 0.772 u[IU]/mL (ref 0.450–4.500)

## 2023-05-14 LAB — RPR: RPR Ser Ql: NONREACTIVE

## 2023-05-14 LAB — HEPATITIS B SURFACE ANTIGEN: Hepatitis B Surface Ag: NEGATIVE

## 2023-05-14 LAB — HEPATITIS C ANTIBODY: Hep C Virus Ab: NONREACTIVE

## 2023-05-14 LAB — HIV ANTIBODY (ROUTINE TESTING W REFLEX): HIV Screen 4th Generation wRfx: NONREACTIVE

## 2023-05-18 ENCOUNTER — Encounter: Payer: Self-pay | Admitting: Family

## 2023-05-19 MED ORDER — EMTRICITABINE-TENOFOVIR DF 200-300 MG PO TABS: 1.0000 | ORAL_TABLET | Freq: Every day | ORAL | 0 refills | Status: DC

## 2023-05-31 ENCOUNTER — Encounter (HOSPITAL_BASED_OUTPATIENT_CLINIC_OR_DEPARTMENT_OTHER): Payer: Self-pay

## 2023-05-31 ENCOUNTER — Emergency Department (HOSPITAL_BASED_OUTPATIENT_CLINIC_OR_DEPARTMENT_OTHER)
Admission: EM | Admit: 2023-05-31 | Discharge: 2023-05-31 | Disposition: A | Discharge status: Home / Self Care | Payer: Medicare Other | Attending: Emergency Medicine | Admitting: Emergency Medicine

## 2023-05-31 ENCOUNTER — Emergency Department (HOSPITAL_BASED_OUTPATIENT_CLINIC_OR_DEPARTMENT_OTHER): Payer: Medicare Other

## 2023-05-31 DIAGNOSIS — M79604 Pain in right leg: Secondary | ICD-10-CM | POA: Diagnosis not present

## 2023-05-31 DIAGNOSIS — M7989 Other specified soft tissue disorders: Secondary | ICD-10-CM | POA: Diagnosis not present

## 2023-05-31 DIAGNOSIS — M7121 Synovial cyst of popliteal space [Baker], right knee: Secondary | ICD-10-CM | POA: Diagnosis not present

## 2023-05-31 DIAGNOSIS — E876 Hypokalemia: Secondary | ICD-10-CM | POA: Insufficient documentation

## 2023-05-31 DIAGNOSIS — Z1152 Encounter for screening for COVID-19: Secondary | ICD-10-CM | POA: Insufficient documentation

## 2023-05-31 DIAGNOSIS — I10 Essential (primary) hypertension: Secondary | ICD-10-CM | POA: Insufficient documentation

## 2023-05-31 DIAGNOSIS — M4807 Spinal stenosis, lumbosacral region: Secondary | ICD-10-CM | POA: Diagnosis not present

## 2023-05-31 DIAGNOSIS — M25551 Pain in right hip: Secondary | ICD-10-CM | POA: Diagnosis not present

## 2023-05-31 DIAGNOSIS — M47816 Spondylosis without myelopathy or radiculopathy, lumbar region: Secondary | ICD-10-CM | POA: Diagnosis not present

## 2023-05-31 DIAGNOSIS — Z96643 Presence of artificial hip joint, bilateral: Secondary | ICD-10-CM | POA: Diagnosis not present

## 2023-05-31 DIAGNOSIS — M48061 Spinal stenosis, lumbar region without neurogenic claudication: Secondary | ICD-10-CM | POA: Diagnosis not present

## 2023-05-31 DIAGNOSIS — Z7982 Long term (current) use of aspirin: Secondary | ICD-10-CM | POA: Diagnosis not present

## 2023-05-31 DIAGNOSIS — M5126 Other intervertebral disc displacement, lumbar region: Secondary | ICD-10-CM | POA: Diagnosis not present

## 2023-05-31 DIAGNOSIS — S79911A Unspecified injury of right hip, initial encounter: Secondary | ICD-10-CM | POA: Diagnosis not present

## 2023-05-31 LAB — CBC WITH DIFFERENTIAL/PLATELET
Abs Immature Granulocytes: 0.02 10*3/uL (ref 0.00–0.07)
Basophils Absolute: 0 10*3/uL (ref 0.0–0.1)
Basophils Relative: 1 %
Eosinophils Absolute: 0.1 10*3/uL (ref 0.0–0.5)
Eosinophils Relative: 1 %
HCT: 37.8 % (ref 36.0–46.0)
Hemoglobin: 13.1 g/dL (ref 12.0–15.0)
Immature Granulocytes: 0 %
Lymphocytes Relative: 17 %
Lymphs Abs: 1.2 10*3/uL (ref 0.7–4.0)
MCH: 33.2 pg (ref 26.0–34.0)
MCHC: 34.7 g/dL (ref 30.0–36.0)
MCV: 95.9 fL (ref 80.0–100.0)
Monocytes Absolute: 0.9 10*3/uL (ref 0.1–1.0)
Monocytes Relative: 12 %
Neutro Abs: 4.8 10*3/uL (ref 1.7–7.7)
Neutrophils Relative %: 69 %
Platelets: 208 10*3/uL (ref 150–400)
RBC: 3.94 MIL/uL (ref 3.87–5.11)
RDW: 12.9 % (ref 11.5–15.5)
WBC: 6.9 10*3/uL (ref 4.0–10.5)
nRBC: 0 % (ref 0.0–0.2)

## 2023-05-31 LAB — BASIC METABOLIC PANEL
Anion gap: 10 (ref 5–15)
BUN: 21 mg/dL (ref 8–23)
CO2: 23 mmol/L (ref 22–32)
Calcium: 8.8 mg/dL — ABNORMAL LOW (ref 8.9–10.3)
Chloride: 102 mmol/L (ref 98–111)
Creatinine, Ser: 0.81 mg/dL (ref 0.44–1.00)
GFR, Estimated: >60 mL/min (ref >60)
Glucose, Bld: 103 mg/dL — ABNORMAL HIGH (ref 70–99)
Potassium: 3.3 mmol/L — ABNORMAL LOW (ref 3.5–5.1)
Sodium: 135 mmol/L (ref 135–145)

## 2023-05-31 LAB — SARS CORONAVIRUS 2 BY RT PCR: SARS Coronavirus 2 by RT PCR: NEGATIVE

## 2023-05-31 MED ORDER — METHOCARBAMOL 500 MG PO TABS: 500.0000 mg | ORAL_TABLET | Freq: Two times a day (BID) | ORAL | 0 refills | Status: DC

## 2023-05-31 MED ORDER — LIDOCAINE 5 % EX PTCH
2.0000 | MEDICATED_PATCH | CUTANEOUS | Status: DC
  Administered 2023-05-31: 2 via TRANSDERMAL
  Filled 2023-05-31 (×2): qty 2

## 2023-05-31 MED ORDER — MORPHINE SULFATE (PF) 4 MG/ML IV SOLN
4.0000 mg | Freq: Once | INTRAVENOUS | Status: AC
  Administered 2023-05-31: 4 mg via INTRAVENOUS
  Filled 2023-05-31: qty 1

## 2023-05-31 MED ORDER — NAPROXEN 500 MG PO TABS: 500.0000 mg | ORAL_TABLET | Freq: Two times a day (BID) | ORAL | 0 refills | Status: DC

## 2023-05-31 MED ORDER — GADOBUTROL 1 MMOL/ML IV SOLN
6.0000 mL | Freq: Once | INTRAVENOUS | Status: AC | PRN
  Administered 2023-05-31: 6 mL via INTRAVENOUS

## 2023-05-31 MED ORDER — HYDROCODONE-ACETAMINOPHEN 5-325 MG PO TABS: 1.0000 | ORAL_TABLET | Freq: Four times a day (QID) | ORAL | 0 refills | Status: DC | PRN

## 2023-05-31 MED ORDER — METHOCARBAMOL 500 MG PO TABS
500.0000 mg | ORAL_TABLET | Freq: Once | ORAL | Status: AC
  Administered 2023-05-31: 500 mg via ORAL
  Filled 2023-05-31: qty 1

## 2023-05-31 MED ORDER — POTASSIUM CHLORIDE CRYS ER 20 MEQ PO TBCR
20.0000 meq | EXTENDED_RELEASE_TABLET | Freq: Once | ORAL | Status: AC
  Administered 2023-05-31: 20 meq via ORAL
  Filled 2023-05-31: qty 1

## 2023-05-31 MED ORDER — DEXAMETHASONE SODIUM PHOSPHATE 10 MG/ML IJ SOLN
10.0000 mg | Freq: Once | INTRAMUSCULAR | Status: AC
  Administered 2023-05-31: 10 mg via INTRAVENOUS
  Filled 2023-05-31: qty 1

## 2023-05-31 NOTE — ED Notes (Signed)
Pt ambulated in the hall way with a walker around the nurse's station fine. Rayfield Citizen PA  observed pt walking.

## 2023-05-31 NOTE — ED Triage Notes (Signed)
Pt reports Pain right groin radiates to right buttocks and down right lateral leg. Went to gym on Wednesday without apparent injury. Pain has gotten worse and has used a walker to assist with ambulation.

## 2023-05-31 NOTE — ED Notes (Signed)
Patient transported to MRI 

## 2023-05-31 NOTE — ED Provider Notes (Signed)
[facility] Provider Note   CSN: [id number] Arrival date & time: 05/31/23  1017     History  Chief Complaint  Patient presents with   Leg Pain    Melissa Grimes is a 67 y.o. female with a past medical history significant for hypertension, hyperlipidemia, history of trochanter bursitis who presents to the ED due to right lower extremity pain.  Patient notes she worked out 3 days ago and performed numerous leg exercises.  She woke up the following morning with excruciating pain in RLE.  Pain most significant in right inner thigh.  Pain also radiates to right buttocks and down entire right lower extremity.  Admits to some edema to right lower extremity.  No chest pain or shortness of breath.  No history of blood clots.  Admits to a fever of 104 F on Friday however, none since.  Also admits to 1 episode of nonbloody, nonbilious emesis 2 days ago.  No history of IV drug use.  No history of cancer.  No direct injury to low back or right lower extremity.  Patient notes she has had a similar episode previously and was diagnosed with bursitis of the right hip.  Denies saddle anesthesia, bowel/bladder incontinence, lower extremity numbness/tingling.  Admits to some right lower extremity weakness secondary to pain.  History obtained from patient and past medical records. No interpreter used during encounter.       Home Medications Prior to Admission medications   Medication Sig Start Date End Date Taking? Authorizing Provider  HYDROcodone-acetaminophen (NORCO/VICODIN) 5-325 MG tablet Take 1 tablet by mouth every 6 (six) hours as needed. 05/31/23  Yes Nirali Magouirk, Merla Riches, PA-C  methocarbamol (ROBAXIN) 500 MG tablet Take 1 tablet (500 mg total) by mouth 2 (two) times daily. 05/31/23  Yes Camiya Vinal C, PA-C  naproxen (NAPROSYN) 500 MG tablet Take 1 tablet (500 mg total) by mouth 2 (two) times daily. 05/31/23  Yes Mannie Stabile, PA-C  aspirin EC  81 MG tablet Take 81 mg by mouth daily.    [provider]  atorvastatin (LIPITOR) 20 MG tablet TAKE 1 TABLET BY MOUTH DAILY 10/15/22   Sandford Craze, NP  Calcium Citrate-Vitamin D 500-400 MG-UNIT CHEW Chew 1 tablet by mouth 2 (two) times daily.     [provider]  emtricitabine-tenofovir (TRUVADA) 200-300 MG tablet Take 1 tablet by mouth daily. 05/19/23   Sandford Craze, NP  lisinopril (ZESTRIL) 20 MG tablet TAKE 1 TABLET BY MOUTH DAILY 04/20/23   Sandford Craze, NP  Multiple Vitamin (MULTIVITAMIN) tablet Take 1 tablet by mouth daily.    [provider]  Multiple Vitamins-Minerals (PRESERVISION AREDS PO) Take by mouth.    [provider]  Omega-3 Fatty Acids (FISH OIL) 1000 MG CAPS Take 1 capsule by mouth daily.    [provider]      Allergies    Achromycin [tetracycline]    Review of Systems   Review of Systems  Constitutional:  Positive for fever.  Respiratory:  Negative for shortness of breath.   Cardiovascular:  Negative for chest pain.  Musculoskeletal:  Positive for arthralgias, gait problem and myalgias.  Neurological:  Negative for numbness.    Physical Exam Updated Vital Signs BP 108/66   Pulse 75   Temp 98.1 F (36.7 C) (Oral)   Resp 18   Ht 5\' 3"  (1.6 m)   Wt 58.1 kg   LMP 11/17/2010   SpO2 96%   BMI 22.67  kg/m  Physical Exam Vitals and nursing note reviewed.  Constitutional:      General: She is not in acute distress.    Appearance: She is not ill-appearing.  HENT:     Head: Normocephalic.  Eyes:     Pupils: Pupils are equal, round, and reactive to light.  Cardiovascular:     Rate and Rhythm: Normal rate and regular rhythm.     Pulses: Normal pulses.     Heart sounds: Normal heart sounds. No murmur heard.    No friction rub. No gallop.  Pulmonary:     Effort: Pulmonary effort is normal.     Breath sounds: Normal breath sounds.  Abdominal:     General: Abdomen is flat. There is no distension.      Palpations: Abdomen is soft.     Tenderness: There is no abdominal tenderness. There is no guarding or rebound.  Musculoskeletal:        General: Normal range of motion.     Cervical back: Neck supple.     Comments: TTP throughout posterior right thigh and inner right thigh. No noticeable edema when compared to LLE. Pedal pulses palpable. Soft compartments. No thoracic or lumbar midline tenderness  Skin:    General: Skin is warm and dry.  Neurological:     General: No focal deficit present.     Mental Status: She is alert.  Psychiatric:        Mood and Affect: Mood normal.        Behavior: Behavior normal.     ED Results / Procedures / Treatments   Labs (all labs ordered are listed, but only abnormal results are displayed) Labs Reviewed  BASIC METABOLIC PANEL - Abnormal; Notable for the following components:      Result Value   Potassium 3.3 (*)    Glucose, Bld 103 (*)    Calcium 8.8 (*)    All other components within normal limits  SARS CORONAVIRUS 2 BY RT PCR  CBC WITH DIFFERENTIAL/PLATELET    EKG None  Radiology MR Lumbar Spine W Wo Contrast  Result Date: 05/31/2023 CLINICAL DATA:  Low back pain. 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 Right gluteal and hip pain extending into the right lower extremity for 2 days. EXAM: MRI LUMBAR SPINE WITHOUT AND WITH CONTRAST TECHNIQUE: Multiplanar and multiecho pulse sequences of the lumbar spine were obtained without and with intravenous contrast. CONTRAST:  6mL GADAVIST GADOBUTROL 1 MMOL/ML IV SOLN COMPARISON:  None Available. FINDINGS: Segmentation: 5 non rib-bearing lumbar type vertebral bodies are present. The lowest fully formed vertebral body is L5. Alignment: Grade 1 anterolisthesis at L4-5 measures 6 mm. Slight anterolisthesis is present at L5-S1. Slight retrolisthesis is present at L1-2 and L2-3. Levoconvex curvature is centered at L2-3. Vertebrae: Type 2 Modic changes are present on the right at L1-2 and L2-3. Mild edematous  type 1 Modic changes are present posteriorly at L3-4. Chronic type 2 Modic changes are present on the left at L5-S1. Edematous changes are noted about the right facet joint at L3-4 and L4-5. There is some associated enhancement. Conus medullaris and cauda equina: Conus extends to the T12-L1 level. Conus and cauda equina appear normal. Paraspinal and other soft tissues: Limited imaging the abdomen is unremarkable. There is no significant adenopathy. No solid organ lesions are present. Disc levels: T12-L1: Moderate facet hypertrophy is present bilaterally. Mild disc bulging is present. No significant stenosis is present. L1-2: Asymmetric right-sided facet hypertrophy is present. A rightward disc protrusion is present.  Moderate right subarticular and severe right foraminal stenosis is present. Mild left foraminal narrowing is present. L2-3: Chronic loss of disc height is present. Mild facet hypertrophy is noted bilaterally. Mild right subarticular narrowing is present. Moderate right and mild left foraminal stenosis is present. L3-4: A broad-based disc protrusion is present. Advanced facet hypertrophy is worse on the right. Focal disc results in moderate to severe left and mild right subarticular narrowing. Severe right and moderate left foraminal stenosis is present. L4-5: A broad-based disc protrusion is present. Advanced facet hypertrophy is noted bilaterally. Moderate subarticular narrowing present bilaterally. Moderate foraminal stenosis is worse on the right. L5-S1: A leftward disc protrusion is present. Moderate facet hypertrophy is worse on the left. Mild subarticular narrowing is present bilaterally. Moderate left foraminal stenosis is present. IMPRESSION: 1. Multilevel spondylosis of the lumbar spine as described. 2. Moderate right subarticular and severe right foraminal stenosis at L1-2. 3. Mild right subarticular narrowing at L2-3 with moderate right and mild left foraminal stenosis. 4. Moderate to severe  left and mild right subarticular narrowing at L3-4. 5. Severe right and moderate left foraminal stenosis at L3-4. 6. Moderate subarticular and foraminal stenosis bilaterally at L4-5 is worse on the right. 7. Mild subarticular narrowing bilaterally at L5-S1 with moderate left foraminal stenosis. 8. Asymmetric right-sided facet hypertrophy and surrounding edema and enhancement suggesting acute arthropathy at L3-4 and L4-5. Electronically Signed   By: Marin Roberts M.D.   On: 05/31/2023 18:24   US Venous Img Lower Right (DVT Study)  Result Date: 05/31/2023 CLINICAL DATA:  Pain and swelling EXAM: Right LOWER EXTREMITY VENOUS DOPPLER ULTRASOUND TECHNIQUE: Gray-scale sonography with compression, as well as color and duplex ultrasound, were performed to evaluate the deep venous system(s) from the level of the common femoral vein through the popliteal and proximal calf veins. COMPARISON:  None Available. FINDINGS: VENOUS Normal compressibility of the common femoral, superficial femoral, and popliteal veins, as well as the visualized calf veins. Portions of right posterior tibial and peroneal veins are not optimally visualized. Visualized portions of profunda femoral vein and great saphenous vein unremarkable. No filling defects to suggest DVT on grayscale or color Doppler imaging. Doppler waveforms show normal direction of venous flow, normal respiratory plasticity and response to augmentation. Limited views of the contralateral common femoral vein are unremarkable. OTHER There is 4.9 x 1.1 x 1.8 cm complex fluid collection in popliteal fossa suggesting popliteal cyst. Limitations: none IMPRESSION: There is no evidence of deep venous thrombosis in right lower extremity. Evaluation of deep veins in calf is less than optimal due to technical difficulties. There is 4.9 x 1.1 x 1.8 cm complex right popliteal cyst. Electronically Signed   By: Ernie Avena M.D.   On: 05/31/2023 15:50   DG Hip Unilat W or Wo  Pelvis 2-3 Views Right  Result Date: 05/31/2023 CLINICAL DATA:  Trauma, pain EXAM: DG HIP (WITH OR WITHOUT PELVIS) 2-3V RIGHT COMPARISON:  10/11/2018 FINDINGS: Previous bilateral hip arthroplasty. No recent fracture or dislocation is seen. IMPRESSION: No recent fracture or dislocation is seen. Previous bilateral hip arthroplasty. Electronically Signed   By: Ernie Avena M.D.   On: 05/31/2023 15:46    Procedures Procedures    Medications Ordered in ED Medications  lidocaine (LIDODERM) 5 % 2 patch (2 patches Transdermal Patch Applied 05/31/23 1358)  potassium chloride SA (KLOR-CON M) CR tablet 20 mEq (has no administration in time range)  dexamethasone (DECADRON) injection 10 mg (has no administration in time range)  morphine (PF) 4  MG/ML injection 4 mg (4 mg Intravenous Given 05/31/23 1401)  methocarbamol (ROBAXIN) tablet 500 mg (500 mg Oral Given 05/31/23 1357)  morphine (PF) 4 MG/ML injection 4 mg (4 mg Intravenous Given 05/31/23 1726)  gadobutrol (GADAVIST) 1 MMOL/ML injection 6 mL (6 mLs Intravenous Contrast Given 05/31/23 1741)    ED Course/ Medical Decision Making/ A&P                             Medical Decision Making Amount and/or Complexity of Data Reviewed Independent Historian: spouse Labs: ordered. Decision-making details documented in ED Course. Radiology: ordered and independent interpretation performed. Decision-making details documented in ED Course.  Risk Prescription drug management.   This patient presents to the ED for concern of RLE pain, this involves an extensive number of treatment options, and is a complaint that carries with it a high risk of complications and morbidity.  The differential diagnosis includes lumbar radiculopathy, muscular strain, DVT, infection, etc  67 year old female presents to the ED due to right lower extremity pain that started after performing numerous leg exercises at the gym 3 days ago.  No direct injury to low back or leg.  Also  endorses some edema.  Had a fever 2 days ago however, none since.  No IV drug use or history of cancer.  Upon arrival, vitals all within normal limits.  Patient in no acute distress.  Patient appears uncomfortable in bed.  Decreased range of motion of RLE likely secondary to pain.  Right lower extremity neurovascularly intact with soft compartments.  Low suspicion for compartment syndrome.  Patient able to slightly move right lower extremity.  Lower suspicion for cauda equina or central cord compression.  Given patient's subjective right lower extremity edema will obtain ultrasound to rule out DVT.  Routine labs ordered and COVID test due to fever on Friday.  Will treat pain and reassess patient for need for imaging.  CBC unremarkable.  No leukocytosis.  Normal hemoglobin.  BMP significant for mild hypokalemia at 3.3.  Potassium repleted.  Normal Renal function.  COVID-negative.  Unclear etiology of fever.  No evidence of septic joint to right hip or right knee.  Ultrasound negative for DVT.  Does demonstrate a complex popliteal cyst behind right knee.  Reassessed patient after first dose of morphine. Patient able to move both lower extremities without difficulty, but still in significant pain. MRI ordered.  X-ray of right hip.  X-ray negative for any bony fractures or acute abnormalities.  MRI personally reviewed which demonstrates multilevel spondylosis.  No findings suggestive of cauda equina.  Patient able to ambulate without difficulty; however does have significant pain. Offered admission for pain management; however patient declined and prefers to be discharged home.   Neurosurgery number given to patient at discharge and advised to call to schedule an appointment for further evaluation.  Dose of Decadron given here in the ED.  Suspect some symptoms of her right lower extremity pain related to lumbar radiculopathy.  Lives at home Has PCP Hx CVA  Discussed with Dr. Wallace Cullens who agrees with assessment  and plan.        Final Clinical Impression(s) / ED Diagnoses Final diagnoses:  Right leg pain    Rx / DC Orders ED Discharge Orders          Ordered    naproxen (NAPROSYN) 500 MG tablet  2 times daily        05/31/23 1757  methocarbamol (ROBAXIN) 500 MG tablet  2 times daily        05/31/23 1757    HYDROcodone-acetaminophen (NORCO/VICODIN) 5-325 MG tablet  Every 6 hours PRN        05/31/23 1757              Mannie Stabile, PA-C 05/31/23 1848    Sloan Leiter, DO 06/02/23 0421

## 2023-05-31 NOTE — Discharge Instructions (Addendum)
It was a pleasure taking care of you today.  As discussed, I suspect your pain is muscular in nature.  Your x-ray was normal.  Ultrasound did not show a blood clot.  It did show a complex cyst behind your right knee.  I am sending you home with pain medication and muscle relaxer.  Take as needed for pain.  Muscle relaxer and pain medication can cause drowsiness so do not drive or operate machinery while on the medication.  Please follow-up with PCP if symptoms do not improve over the next 2 to 3 days.  Return to the ER for any worsening symptoms.  I have included the number of the neurosurgeon. Call tomorrow to schedule an appointment for further evauation.

## 2023-06-02 ENCOUNTER — Ambulatory Visit (INDEPENDENT_AMBULATORY_CARE_PROVIDER_SITE_OTHER): Payer: Medicare Other | Admitting: Family

## 2023-06-02 VITALS — BP 110/78 | HR 68 | Temp 97.7°F | Resp 16 | Wt 131.0 lb

## 2023-06-02 DIAGNOSIS — M5416 Radiculopathy, lumbar region: Secondary | ICD-10-CM | POA: Diagnosis not present

## 2023-06-02 DIAGNOSIS — M7121 Synovial cyst of popliteal space [Baker], right knee: Secondary | ICD-10-CM | POA: Diagnosis not present

## 2023-06-02 MED ORDER — METHYLPREDNISOLONE 4 MG PO TBPK: ORAL_TABLET | ORAL | 0 refills | Status: DC

## 2023-06-02 NOTE — Assessment & Plan Note (Signed)
  Popliteal Cyst: Incidental finding of a complex right popliteal cyst on ultrasound. No current symptoms related to this finding. -Monitor for any changes or symptoms related to the cyst.

## 2023-06-02 NOTE — Assessment & Plan Note (Signed)
Severe right-sided pain in the lower extremity due to multilevel severe foraminal stenosis with facet hypertrophy and surrounding edema at L3-L4 and L4-L5. Pain is currently rated as 3/10 at rest and increases with movement. -Start oral steroids to reduce inflammation and pain.Stop naproxen. -Continue Methocarbamol as needed for muscle relaxation. -Use Oxycodone and Tylenol as needed for pain control. -Refer to neurosurgery for consultation regarding potential epidural steroid injection or surgical intervention.

## 2023-06-02 NOTE — Progress Notes (Signed)
Subjective:     Patient ID: Melissa Grimes, female    DOB: 1956-10-17, 67 y.o.   MRN: [id number]  Chief Complaint  Patient presents with   Leg Pain    Complains of right leg pain since Friday getting worse    Leg Pain     Discussed the use of AI scribe software for clinical note transcription with the patient, who gave verbal consent to proceed.  History of Present Illness   The patient, a 67 year old female with a history of scoliosis and recent right knee replacement, presents for follow-up after an emergency room visit on 05-31-23 for excruciating right leg pain. The pain was most notable in the right inner thigh, radiating into the right buttocks and down the entire right lower extremity. An MRI of the lumbar spine revealed multilevel severe right foraminal stenosis with asymmetric right-sided facet hypertrophy and surrounding edema at L3 to 4 and L4 to 5, likely causing acute arthropathy. An ultrasound of the lower extremity was negative for DVT, but a complex right popliteal cyst was incidentally found. The patient was treated with methocarbamol and naproxen, and one steroid injection IM given in the ER.  The patient reports some improvement in mobility but continues to experience pain and difficulty lifting the right leg. She has been dedicated to strengthening the right leg following knee replacement but believes the current issue is not fixable through exercise. The patient also reports lower back pain when sitting, which she believes may be related to her scoliosis and hip curvature.       MPRESSION: 1. Multilevel spondylosis of the lumbar spine as described. 2. Moderate right subarticular and severe right foraminal stenosis at L1-2. 3. Mild right subarticular narrowing at L2-3 with moderate right and mild left foraminal stenosis. 4. Moderate to severe left and mild right subarticular narrowing at L3-4. 5. Severe right and moderate left foraminal stenosis at L3-4. 6.  Moderate subarticular and foraminal stenosis bilaterally at L4-5 is worse on the right. 7. Mild subarticular narrowing bilaterally at L5-S1 with moderate left foraminal stenosis. 8. Asymmetric right-sided facet hypertrophy and surrounding edema and enhancement suggesting acute arthropathy at L3-4 and L4-5.   Health Maintenance Due  Topic Date Due   COVID-19 Vaccine (6 - 2023-24 season) 07/18/2022    Past Medical History:  Diagnosis Date   Arthritis    Complication of anesthesia    difficulty urinating after   Hyperlipidemia    Hypertension    Macular degeneration    Trochanteric bursitis, right hip 10/11/2018    Past Surgical History:  Procedure Laterality Date   BUNIONECTOMY     CESAREAN SECTION     x 2   HERNIA REPAIR Left 11/18/1975   inguinal   OSTEOTOMY  1974 & 1975   toe removal Right 12/08/2021   right second toe   TOTAL HIP ARTHROPLASTY Right 01/18/2013   Procedure: Right TOTAL HIP ARTHROPLASTY ANTERIOR APPROACH;  Surgeon: Kathryne Hitch, MD;  Location: [facility];  Service: Orthopedics;  Laterality: Right;   TOTAL HIP ARTHROPLASTY Left 08/09/2013   Procedure: LEFT TOTAL HIP ARTHROPLASTY ANTERIOR APPROACH;  Surgeon: Kathryne Hitch, MD;  Location: [facility] OR;  Service: Orthopedics;  Laterality: Left;    Family History  Problem Relation Age of Onset   Arthritis Mother        osteoarthritis   Hyperlipidemia Mother    Heart disease Mother    Hypertension Mother    Hypothyroidism Mother    Hyperlipidemia Father  Heart disease Father    Stroke Father    Parkinson's disease Father    Obesity Sister        "overweight"   Hypertension Sister    Cancer Maternal Grandfather        lung   Varicose Veins Child    Asthma Child    Migraines Child    Cancer Cousin        breast    Social History   Socioeconomic History   Marital status: Married    Spouse name: Not on file   Number of children: 2   Years of education: Not on file   Highest  education level: Associate degree: occupational, Scientist, product/process development, or vocational program  Occupational History   Not on file  Tobacco Use   Smoking status: Never   Smokeless tobacco: Never  Vaping Use   Vaping status: Never Used  Substance and Sexual Activity   Alcohol use: Yes    Alcohol/week: 3.0 standard drinks of alcohol    Types: 3 Glasses of wine per week    Comment: 1-3   Drug use: No   Sexual activity: Yes    Partners: Male  Other Topics Concern   Not on file  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 [employer]    children- grown both in [place]   Enjoys writing christian ed Scientist, clinical (histocompatibility and immunogenetics)   Social Determinants of Health   Financial Resource Strain: Low Risk  (04/05/2023)   Overall Financial Resource Strain (CARDIA)    Difficulty of Paying Living Expenses: Not hard at all  Food Insecurity: No Food Insecurity (04/05/2023)   Hunger Vital Sign    Worried About Running Out of Food in the Last Year: Never true    Ran Out of Food in the Last Year: Never true  Transportation Needs: No Transportation Needs (04/05/2023)   PRAPARE - Administrator, Civil Service (Medical): No    Lack of Transportation (Non-Medical): No  Physical Activity: Insufficiently Active (04/05/2023)   Exercise Vital Sign    Days of Exercise per Week: 2 days    Minutes of Exercise per Session: 40 min  Stress: No Stress Concern Present (04/05/2023)   [employer] of Occupational Health - Occupational Stress Questionnaire    Feeling of Stress : Not at all  Social Connections: Socially Integrated (04/05/2023)   Social Connection and Isolation Panel [NHANES]    Frequency of Communication with Friends and Family: Three times a week    Frequency of Social Gatherings with Friends and Family: More than three times a week    Attends Religious Services: More than 4 times per year    Active Member of [employer] or Organizations: Yes    Attends Banker  Meetings: 1 to 4 times per year    Marital Status: Married  Catering manager Violence: Not At Risk (12/29/2022)   Humiliation, Afraid, Rape, and Kick questionnaire    Fear of Current or Ex-Partner: No    Emotionally Abused: No    Physically Abused: No    Sexually Abused: No    Outpatient Medications Prior to Visit  Medication Sig Dispense Refill   aspirin EC 81 MG tablet Take 81 mg by mouth daily.     atorvastatin (LIPITOR) 20 MG tablet TAKE 1 TABLET BY MOUTH DAILY 100 tablet 2   Calcium Citrate-Vitamin D 500-400 MG-UNIT CHEW Chew 1 tablet by mouth 2 (two) times daily.  emtricitabine-tenofovir (TRUVADA) 200-300 MG tablet Take 1 tablet by mouth daily. 90 tablet 0   HYDROcodone-acetaminophen (NORCO/VICODIN) 5-325 MG tablet Take 1 tablet by mouth every 6 (six) hours as needed. 6 tablet 0   lisinopril (ZESTRIL) 20 MG tablet TAKE 1 TABLET BY MOUTH DAILY 100 tablet 1   methocarbamol (ROBAXIN) 500 MG tablet Take 1 tablet (500 mg total) by mouth 2 (two) times daily. 20 tablet 0   Multiple Vitamin (MULTIVITAMIN) tablet Take 1 tablet by mouth daily.     Multiple Vitamins-Minerals (PRESERVISION AREDS PO) Take by mouth.     naproxen (NAPROSYN) 500 MG tablet Take 1 tablet (500 mg total) by mouth 2 (two) times daily. 30 tablet 0   Omega-3 Fatty Acids (FISH OIL) 1000 MG CAPS Take 1 capsule by mouth daily.     No facility-administered medications prior to visit.    Allergies  Allergen Reactions   Achromycin [Tetracycline] Hives    Review of Systems  Neurological:        Denies bowel/bladder incontinence or saddle paresthesia       Objective:    Physical Exam Constitutional:      Appearance: Normal appearance.  Cardiovascular:     Rate and Rhythm: Normal rate.  Pulmonary:     Effort: Pulmonary effort is normal.  Neurological:     General: No focal deficit present.     Mental Status: She is alert and oriented to person, place, and time.     Comments: Difficulty lifting right leg,  using walker to ambulate  Psychiatric:        Mood and Affect: Mood normal.        Behavior: Behavior normal.        Thought Content: Thought content normal.        Judgment: Judgment normal.      BP 110/78 (BP Location: Right Arm, Patient Position: Sitting, Cuff Size: Small)   Pulse 68   Temp 97.7 F (36.5 C) (Oral)   Resp 16   Wt 131 lb (59.4 kg)   LMP 11/17/2010   SpO2 95%   BMI 23.21 kg/m  Wt Readings from Last 3 Encounters:  06/02/23 131 lb (59.4 kg)  05/31/23 128 lb (58.1 kg)  05/13/23 132 lb (59.9 kg)       Assessment & Plan:   Problem List Items Addressed This Visit       Unprioritized   Synovial cyst of right popliteal space     Popliteal Cyst: Incidental finding of a complex right popliteal cyst on ultrasound. No current symptoms related to this finding. -Monitor for any changes or symptoms related to the cyst.      Lumbar radiculopathy - Primary     Severe right-sided pain in the lower extremity due to multilevel severe foraminal stenosis with facet hypertrophy and surrounding edema at L3-L4 and L4-L5. Pain is currently rated as 3/10 at rest and increases with movement. -Start oral steroids to reduce inflammation and pain.Stop naproxen. -Continue Methocarbamol as needed for muscle relaxation. -Use Oxycodone and Tylenol as needed for pain control. -Refer to neurosurgery for consultation regarding potential epidural steroid injection or surgical intervention.      Relevant Medications   methylPREDNISolone (MEDROL DOSEPAK) 4 MG TBPK tablet   Other Relevant Orders   Ambulatory referral to Neurosurgery    I am having Ellard Artis start on methylPREDNISolone. I am also having her maintain her calcium citrate-vitamin D, aspirin EC, Fish Oil, multivitamin, Multiple Vitamins-Minerals (PRESERVISION AREDS PO), atorvastatin, lisinopril,  emtricitabine-tenofovir, naproxen, methocarbamol, and HYDROcodone-acetaminophen.  Meds ordered this encounter  Medications    methylPREDNISolone (MEDROL DOSEPAK) 4 MG TBPK tablet    Sig: Take orally per package instructions    Dispense:  21 tablet    Refill:  0    Order Specific Question:   Supervising Provider    Answer:   Danise Edge A [[id number]]

## 2023-06-11 DIAGNOSIS — M5416 Radiculopathy, lumbar region: Secondary | ICD-10-CM | POA: Diagnosis not present

## 2023-06-11 DIAGNOSIS — M4316 Spondylolisthesis, lumbar region: Secondary | ICD-10-CM | POA: Diagnosis not present

## 2023-06-11 DIAGNOSIS — M5413 Radiculopathy, cervicothoracic region: Secondary | ICD-10-CM | POA: Diagnosis not present

## 2023-06-12 ENCOUNTER — Encounter: Payer: Self-pay | Admitting: Physician Assistant

## 2023-06-12 ENCOUNTER — Ambulatory Visit: Payer: Medicare Other | Admitting: Physician Assistant

## 2023-06-12 ENCOUNTER — Ambulatory Visit (HOSPITAL_BASED_OUTPATIENT_CLINIC_OR_DEPARTMENT_OTHER)
Admission: RE | Admit: 2023-06-12 | Discharge: 2023-06-12 | Disposition: A | Discharge status: Home / Self Care | Payer: Medicare Other | Source: Ambulatory Visit | Attending: Physician Assistant | Admitting: Physician Assistant

## 2023-06-12 VITALS — BP 130/83 | HR 75 | Temp 97.7°F | Resp 20 | Wt 128.0 lb

## 2023-06-12 DIAGNOSIS — M79642 Pain in left hand: Secondary | ICD-10-CM

## 2023-06-12 DIAGNOSIS — M1812 Unilateral primary osteoarthritis of first carpometacarpal joint, left hand: Secondary | ICD-10-CM | POA: Diagnosis not present

## 2023-06-12 DIAGNOSIS — M25532 Pain in left wrist: Secondary | ICD-10-CM | POA: Diagnosis not present

## 2023-06-12 DIAGNOSIS — M19042 Primary osteoarthritis, left hand: Secondary | ICD-10-CM | POA: Diagnosis not present

## 2023-06-12 DIAGNOSIS — Z043 Encounter for examination and observation following other accident: Secondary | ICD-10-CM | POA: Diagnosis not present

## 2023-06-12 NOTE — Progress Notes (Signed)
Established patient visit   Patient: Melissa Grimes   DOB: [birth year]   67 y.o. Female  MRN: [id number] Visit Date: 06/12/2023  Today's healthcare provider: Alfredia Ferguson, PA-C   Chief Complaint  Patient presents with   Hand Injury    Left hand pain after fall at the gym on Tuesday   Subjective    HPI Pt reports tripping over her right foot on Tuesday and landed on her left hand and part of her face. Reports continued left hand pain, she has it wrapped with an ace bandage.   Denies continued face pain. Reports bruise to her right shoulder and that her tooth cut her lip. Denies continued shoulder pain, no tooth pain.  Medications: Outpatient Medications Prior to Visit  Medication Sig   aspirin EC 81 MG tablet Take 81 mg by mouth daily.   atorvastatin (LIPITOR) 20 MG tablet TAKE 1 TABLET BY MOUTH DAILY   Calcium Citrate-Vitamin D 500-400 MG-UNIT CHEW Chew 1 tablet by mouth 2 (two) times daily.    emtricitabine-tenofovir (TRUVADA) 200-300 MG tablet Take 1 tablet by mouth daily.   HYDROcodone-acetaminophen (NORCO/VICODIN) 5-325 MG tablet Take 1 tablet by mouth every 6 (six) hours as needed.   lisinopril (ZESTRIL) 20 MG tablet TAKE 1 TABLET BY MOUTH DAILY   methocarbamol (ROBAXIN) 500 MG tablet Take 1 tablet (500 mg total) by mouth 2 (two) times daily.   methylPREDNISolone (MEDROL DOSEPAK) 4 MG TBPK tablet Take orally per package instructions   Multiple Vitamin (MULTIVITAMIN) tablet Take 1 tablet by mouth daily.   Multiple Vitamins-Minerals (PRESERVISION AREDS PO) Take by mouth.   naproxen (NAPROSYN) 500 MG tablet Take 1 tablet (500 mg total) by mouth 2 (two) times daily.   Omega-3 Fatty Acids (FISH OIL) 1000 MG CAPS Take 1 capsule by mouth daily.   No facility-administered medications prior to visit.      Objective    BP 130/83 (BP Location: Right Arm, Patient Position: Sitting, Cuff Size: Normal)   Pulse 75   Temp 97.7 F (36.5 C) (Oral)   Resp 20   Wt 128 lb  (58.1 kg)   LMP 11/17/2010   SpO2 99%   BMI 22.67 kg/m   Physical Exam Vitals reviewed.  Constitutional:      Appearance: She is not ill-appearing.  HENT:     Head: Normocephalic.     Comments: Very slight swelling to the right cheek. No tenderness. Eyes:     Conjunctiva/sclera: Conjunctivae normal.  Cardiovascular:     Rate and Rhythm: Normal rate.  Pulmonary:     Effort: Pulmonary effort is normal. No respiratory distress.  Musculoskeletal:     Comments: Tender to medial hand and pinky finger. No visible edema, ecchymosis.    Neurological:     General: No focal deficit present.     Mental Status: She is alert and oriented to person, place, and time.  Psychiatric:        Mood and Affect: Mood normal.        Behavior: Behavior normal.      No results found for any visits on 06/12/23.  Assessment & Plan     1. Left hand pain R/o fracture w/ xrays.  - DG Hand Complete Left; Future - DG Wrist Complete Left; Future   Return if symptoms worsen or fail to improve.      I, Alfredia Ferguson, PA-C have reviewed all documentation for this visit. The documentation on  06/12/23   for  the exam, diagnosis, procedures, and orders are all accurate and complete.    Alfredia Ferguson, PA-C  [facility] [phone] (phone) [phone] (fax)  [facility]

## 2023-06-15 ENCOUNTER — Other Ambulatory Visit: Payer: Self-pay | Admitting: Physician Assistant

## 2023-06-15 DIAGNOSIS — M5416 Radiculopathy, lumbar region: Secondary | ICD-10-CM | POA: Diagnosis not present

## 2023-06-15 DIAGNOSIS — M545 Low back pain, unspecified: Secondary | ICD-10-CM | POA: Diagnosis not present

## 2023-06-15 DIAGNOSIS — S52592A Other fractures of lower end of left radius, initial encounter for closed fracture: Secondary | ICD-10-CM

## 2023-06-16 ENCOUNTER — Encounter: Payer: Self-pay | Admitting: Orthopaedic Surgery

## 2023-06-16 ENCOUNTER — Ambulatory Visit: Payer: Medicare Other | Admitting: Orthopaedic Surgery

## 2023-06-16 DIAGNOSIS — M1812 Unilateral primary osteoarthritis of first carpometacarpal joint, left hand: Secondary | ICD-10-CM

## 2023-06-16 MED ORDER — MELOXICAM 7.5 MG PO TABS: 7.5000 mg | ORAL_TABLET | Freq: Two times a day (BID) | ORAL | 2 refills | Status: AC | PRN

## 2023-06-16 NOTE — Progress Notes (Signed)
Office Visit Note   Patient: Melissa Grimes           Date of Birth: 12-23-1955           MRN: [id number] Visit Date: 06/16/2023              Requested by: Melissa Ferguson, PA-C [address] [place],  [place] [address] PCP: Melissa Craze, NP   Assessment & Plan: Visit Diagnoses:  1. Primary osteoarthritis of first carpometacarpal joint of left hand     Plan: Impression is 67 year old female with left thumb pain.  This is likely due to aggravation of her underlying CMC osteoarthritis which is fairly severe.  She states that she had mild symptoms prior to the injury.  I recommended wearing the CMC brace until her symptoms improve which point she can wean as tolerated.  She will use Voltaren gel and I sent in a prescription for meloxicam.  She may try naproxen as well.  Will see her back as needed.  Follow-Up Instructions: No follow-ups on file.   Orders:  No orders of the defined types were placed in this encounter.  Meds ordered this encounter  Medications   meloxicam (MOBIC) 7.5 MG tablet    Sig: Take 1 tablet (7.5 mg total) by mouth 2 (two) times daily as needed for pain.    Dispense:  30 tablet    Refill:  2      Procedures: No procedures performed   Clinical Data: No additional findings.   Subjective: Chief Complaint  Patient presents with   Left Hand - Injury    DOI 06/09/2023    HPI Melissa Grimes is a very pleasant 67 year old female here for evaluation of left hand pain.  She fell while playing basketball with her husband about a week ago and has had pain at the base of her thumb.  She had x-rays at her PCPs office.  Right-hand-dominant. Review of Systems  Constitutional: Negative.   HENT: Negative.    Eyes: Negative.   Respiratory: Negative.    Cardiovascular: Negative.   Endocrine: Negative.   Musculoskeletal: Negative.   Neurological: Negative.   Hematological: Negative.   Psychiatric/Behavioral: Negative.    All other systems reviewed  and are negative.    Objective: Vital Signs: LMP 11/17/2010   Physical Exam Vitals and nursing note reviewed.  Constitutional:      Appearance: She is well-developed.  HENT:     Head: Atraumatic.     Nose: Nose normal.  Eyes:     Extraocular Movements: Extraocular movements intact.  Cardiovascular:     Pulses: Normal pulses.  Pulmonary:     Effort: Pulmonary effort is normal.  Abdominal:     Palpations: Abdomen is soft.  Musculoskeletal:     Cervical back: Neck supple.  Skin:    General: Skin is warm.     Capillary Refill: Capillary refill takes less than 2 seconds.  Neurological:     Mental Status: She is alert. Mental status is at baseline.  Psychiatric:        Behavior: Behavior normal.        Thought Content: Thought content normal.        Judgment: Judgment normal.     Ortho Exam Examination of the left hand shows pain with thumb CMC grind test.  She has no significant swelling.  No neurovascular compromise. Specialty Comments:  No specialty comments available.  Imaging: No results found.   PMFS History: Patient Active Problem List  Diagnosis Date Noted   Lumbar radiculopathy 06/02/2023   Synovial cyst of right popliteal space 06/02/2023   COVID-19 virus infection 01/20/2022   Acute pain of left knee 10/22/2021   High risk sexual behavior 08/09/2021   History of motion sickness 08/09/2021   S/P amputation of lesser toe, right (HCC) 12/05/2020   Bunion, right foot 10/25/2020   History of total right hip arthroplasty 10/11/2018   Allergic rhinitis 04/02/2016   Multinodular goiter 12/26/2015   LGSIL on Pap smear of cervix 09/10/2015   Abnormal Pap smear of cervix 08/08/2015   CVA (cerebral infarction) 08/24/2013   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   OA (osteoarthritis) 08/14/2013   Dyslipidemia 08/14/2013   Preventative health care 12/22/2012   HTN (hypertension) 12/01/2012   Hyperlipidemia 12/01/2012    Osteoarthritis 12/01/2012   Fibroid uterus 12/01/2012   Past Medical History:  Diagnosis Date   Arthritis    Complication of anesthesia    difficulty urinating after   Hyperlipidemia    Hypertension    Macular degeneration    Trochanteric bursitis, right hip 10/11/2018    Family History  Problem Relation Age of Onset   Arthritis Mother        osteoarthritis   Hyperlipidemia Mother    Heart disease Mother    Hypertension Mother    Hypothyroidism Mother    Hyperlipidemia Father    Heart disease Father    Stroke Father    Parkinson's disease Father    Obesity Sister        "overweight"   Hypertension Sister    Cancer Maternal Grandfather        lung   Varicose Veins Child    Asthma Child    Migraines Child    Cancer Cousin        breast    Past Surgical History:  Procedure Laterality Date   BUNIONECTOMY     CESAREAN SECTION     x 2   HERNIA REPAIR Left 11/18/1975   inguinal   OSTEOTOMY  1974 & 1975   toe removal Right 12/08/2021   right second toe   TOTAL HIP ARTHROPLASTY Right 01/18/2013   Procedure: Right TOTAL HIP ARTHROPLASTY ANTERIOR APPROACH;  Surgeon: Melissa Hitch, MD;  Location: [facility] OR;  Service: Orthopedics;  Laterality: Right;   TOTAL HIP ARTHROPLASTY Left 08/09/2013   Procedure: LEFT TOTAL HIP ARTHROPLASTY ANTERIOR APPROACH;  Surgeon: Melissa Hitch, MD;  Location: [facility] OR;  Service: Orthopedics;  Laterality: Left;   Social History   Occupational History   Not on file  Tobacco Use   Smoking status: Never   Smokeless tobacco: Never  Vaping Use   Vaping status: Never Used  Substance and Sexual Activity   Alcohol use: Yes    Alcohol/week: 3.0 standard drinks of alcohol    Types: 3 Glasses of wine per week    Comment: 1-3   Drug use: No   Sexual activity: Yes    Partners: Male

## 2023-06-24 ENCOUNTER — Other Ambulatory Visit: Payer: Self-pay | Admitting: Family

## 2023-07-10 ENCOUNTER — Encounter: Payer: Self-pay | Admitting: Family

## 2023-07-10 MED ORDER — PAXLOVID (300/100) 20 X 150 MG & 10 X 100MG PO TBPK: ORAL_TABLET | ORAL | 0 refills | Status: DC

## 2023-07-15 ENCOUNTER — Ambulatory Visit (HOSPITAL_BASED_OUTPATIENT_CLINIC_OR_DEPARTMENT_OTHER)
Admission: RE | Admit: 2023-07-15 | Discharge: 2023-07-15 | Disposition: A | Discharge status: Home / Self Care | Payer: Medicare Other | Source: Ambulatory Visit | Attending: Family | Admitting: Family

## 2023-07-15 ENCOUNTER — Ambulatory Visit (INDEPENDENT_AMBULATORY_CARE_PROVIDER_SITE_OTHER): Payer: Medicare Other | Admitting: Family

## 2023-07-15 VITALS — BP 119/83 | HR 65 | Temp 97.8°F | Resp 16 | Wt 129.0 lb

## 2023-07-15 DIAGNOSIS — U071 COVID-19: Secondary | ICD-10-CM | POA: Insufficient documentation

## 2023-07-15 DIAGNOSIS — R059 Cough, unspecified: Secondary | ICD-10-CM | POA: Diagnosis not present

## 2023-07-15 MED ORDER — GUAIFENESIN-CODEINE 100-10 MG/5ML PO SYRP: 5.0000 mL | ORAL_SOLUTION | Freq: Three times a day (TID) | ORAL | 0 refills | Status: DC | PRN

## 2023-07-15 NOTE — Telephone Encounter (Signed)
Appt scheduled today w/ PCP.

## 2023-07-15 NOTE — Progress Notes (Signed)
Subjective:     Patient ID: Melissa Grimes, female    DOB: 1956-09-27, 67 y.o.   MRN: [id number]  Chief Complaint  Patient presents with   Covid Positive    Patient here for follow up, on paxlovid   Cough    Still having persistent cough    Cough    Discussed the use of AI scribe software for clinical note transcription with the patient, who gave verbal consent to proceed.  History of Present Illness   The patient, with a history of COVID-19 infection (symptoms begain on 8/21), presents with a persistent cough, particularly at night. She has been managing the cough with over-the-counter cough medicine and Mucinex, but reports that the symptoms persist throughout the night despite these interventions. The patient also reports fatigue, which she attributes to both the medication and the COVID-19 infection itself. She has nearly completed a course of Paxlovid, with one dose remaining.  In addition to the cough, the patient has been experiencing cognitive difficulties, which she describes as feeling "stupid" and having difficulty with tasks such as starting her car. She also reports that she has been feeling tired, which she attributes to both the Paxlovid and the COVID-19 infection.  The patient is concerned about when she can safely return to her senior living community, as she is currently testing positive for COVID-19. She is also concerned about her vaccination status, as her chart indicates that she is behind on her COVID-19 vaccines.          Health Maintenance Due  Topic Date Due   COVID-19 Vaccine (6 - 2023-24 season) 07/18/2022   INFLUENZA VACCINE  06/18/2023    Past Medical History:  Diagnosis Date   Arthritis    Complication of anesthesia    difficulty urinating after   Hyperlipidemia    Hypertension    Macular degeneration    Trochanteric bursitis, right hip 10/11/2018    Past Surgical History:  Procedure Laterality Date   BUNIONECTOMY     CESAREAN  SECTION     x 2   HERNIA REPAIR Left 11/18/1975   inguinal   OSTEOTOMY  1974 & 1975   toe removal Right 12/08/2021   right second toe   TOTAL HIP ARTHROPLASTY Right 01/18/2013   Procedure: Right TOTAL HIP ARTHROPLASTY ANTERIOR APPROACH;  Surgeon: Kathryne Hitch, MD;  Location: [facility] OR;  Service: Orthopedics;  Laterality: Right;   TOTAL HIP ARTHROPLASTY Left 08/09/2013   Procedure: LEFT TOTAL HIP ARTHROPLASTY ANTERIOR APPROACH;  Surgeon: Kathryne Hitch, MD;  Location: [facility] OR;  Service: Orthopedics;  Laterality: Left;    Family History  Problem Relation Age of Onset   Arthritis Mother        osteoarthritis   Hyperlipidemia Mother    Heart disease Mother    Hypertension Mother    Hypothyroidism Mother    Hyperlipidemia Father    Heart disease Father    Stroke Father    Parkinson's disease Father    Obesity Sister        "overweight"   Hypertension Sister    Cancer Maternal Grandfather        lung   Varicose Veins Child    Asthma Child    Migraines Child    Cancer Cousin        breast    Social History   Socioeconomic History   Marital status: Married    Spouse name: Not on file   Number of children: 2  Years of education: Not on file   Highest education level: Associate degree: occupational, Scientist, product/process development, or vocational program  Occupational History   Not on file  Tobacco Use   Smoking status: Never   Smokeless tobacco: Never  Vaping Use   Vaping status: Never Used  Substance and Sexual Activity   Alcohol use: Yes    Alcohol/week: 3.0 standard drinks of alcohol    Types: 3 Glasses of wine per week    Comment: 1-3   Drug use: No   Sexual activity: Yes    Partners: Male  Other Topics Concern   Not on file  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 [employer]    children- grown both in [place]   Enjoys writing christian ed Scientist, clinical (histocompatibility and immunogenetics)   Social Determinants of Health   Financial Resource Strain:  Low Risk  (04/05/2023)   Overall Financial Resource Strain (CARDIA)    Difficulty of Paying Living Expenses: Not hard at all  Food Insecurity: No Food Insecurity (04/05/2023)   Hunger Vital Sign    Worried About Running Out of Food in the Last Year: Never true    Ran Out of Food in the Last Year: Never true  Transportation Needs: No Transportation Needs (04/05/2023)   PRAPARE - Administrator, Civil Service (Medical): No    Lack of Transportation (Non-Medical): No  Physical Activity: Insufficiently Active (04/05/2023)   Exercise Vital Sign    Days of Exercise per Week: 2 days    Minutes of Exercise per Session: 40 min  Stress: No Stress Concern Present (04/05/2023)   [employer] of Occupational Health - Occupational Stress Questionnaire    Feeling of Stress : Not at all  Social Connections: Socially Integrated (04/05/2023)   Social Connection and Isolation Panel [NHANES]    Frequency of Communication with Friends and Family: Three times a week    Frequency of Social Gatherings with Friends and Family: More than three times a week    Attends Religious Services: More than 4 times per year    Active Member of [employer] or Organizations: Yes    Attends Banker Meetings: 1 to 4 times per year    Marital Status: Married  Catering manager Violence: Not At Risk (12/29/2022)   Humiliation, Afraid, Rape, and Kick questionnaire    Fear of Current or Ex-Partner: No    Emotionally Abused: No    Physically Abused: No    Sexually Abused: No    Outpatient Medications Prior to Visit  Medication Sig Dispense Refill   aspirin EC 81 MG tablet Take 81 mg by mouth daily.     atorvastatin (LIPITOR) 20 MG tablet TAKE 1 TABLET BY MOUTH ONCE  DAILY 100 tablet 2   Calcium Citrate-Vitamin D 500-400 MG-UNIT CHEW Chew 1 tablet by mouth 2 (two) times daily.      emtricitabine-tenofovir (TRUVADA) 200-300 MG tablet Take 1 tablet by mouth daily. 90 tablet 0   lisinopril (ZESTRIL) 20 MG  tablet TAKE 1 TABLET BY MOUTH DAILY 100 tablet 1   meloxicam (MOBIC) 7.5 MG tablet Take 1 tablet (7.5 mg total) by mouth 2 (two) times daily as needed for pain. 30 tablet 2   Multiple Vitamin (MULTIVITAMIN) tablet Take 1 tablet by mouth daily.     Multiple Vitamins-Minerals (PRESERVISION AREDS PO) Take by mouth.     nirmatrelvir & ritonavir (PAXLOVID, 300/100,) 20 x 150 MG & 10 x 100MG  TBPK  Take per package instructions 30 tablet 0   Omega-3 Fatty Acids (FISH OIL) 1000 MG CAPS Take 1 capsule by mouth daily.     HYDROcodone-acetaminophen (NORCO/VICODIN) 5-325 MG tablet Take 1 tablet by mouth every 6 (six) hours as needed. 6 tablet 0   methocarbamol (ROBAXIN) 500 MG tablet Take 1 tablet (500 mg total) by mouth 2 (two) times daily. 20 tablet 0   methylPREDNISolone (MEDROL DOSEPAK) 4 MG TBPK tablet Take orally per package instructions 21 tablet 0   naproxen (NAPROSYN) 500 MG tablet Take 1 tablet (500 mg total) by mouth 2 (two) times daily. 30 tablet 0   No facility-administered medications prior to visit.    Allergies  Allergen Reactions   Achromycin [Tetracycline] Hives    Review of Systems  Respiratory:  Positive for cough.        Objective:    Physical Exam Constitutional:      General: She is not in acute distress.    Appearance: Normal appearance. She is well-developed.  HENT:     Head: Normocephalic and atraumatic.     Right Ear: Tympanic membrane, ear canal and external ear normal.     Left Ear: Tympanic membrane, ear canal and external ear normal.  Eyes:     General: No scleral icterus. Neck:     Thyroid: No thyromegaly.  Cardiovascular:     Rate and Rhythm: Normal rate and regular rhythm.     Heart sounds: Normal heart sounds. No murmur heard. Pulmonary:     Effort: Pulmonary effort is normal. No respiratory distress.     Breath sounds: Normal breath sounds. No wheezing.  Musculoskeletal:     Cervical back: Neck supple.  Lymphadenopathy:     Cervical: No cervical  adenopathy.  Skin:    General: Skin is warm and dry.  Neurological:     Mental Status: She is alert and oriented to person, place, and time.  Psychiatric:        Mood and Affect: Mood normal.        Behavior: Behavior normal.        Thought Content: Thought content normal.        Judgment: Judgment normal.      BP 119/83 (BP Location: Right Arm, Patient Position: Sitting, Cuff Size: Small)   Pulse 65   Temp 97.8 F (36.6 C) (Oral)   Resp 16   Wt 129 lb (58.5 kg)   LMP 11/17/2010   SpO2 99%   BMI 22.85 kg/m  Wt Readings from Last 3 Encounters:  07/15/23 129 lb (58.5 kg)  06/12/23 128 lb (58.1 kg)  06/02/23 131 lb (59.4 kg)       Assessment & Plan:   Problem List Items Addressed This Visit       Unprioritized   COVID-19 virus infection    Persistent cough and fatigue. No signs of pneumonia on auscultation. -Order chest x-ray to rule out early pneumonia. -Prescribe cough syrup with codeine for nighttime use for 5 days, to be taken as needed at bedtime. -Continue Delsym during the day.   Up to date on COVID-19 vaccines, but due for upcoming vaccine and flu shot. -Recommend flu shot in 2 weeks. -Recommend next COVID-19 vaccine around Thanksgiving.      Other Visit Diagnoses     COVID-19    -  Primary   Relevant Orders   DG Chest 2 View (Completed)       I have discontinued Alona Bene E. Bordwell's naproxen, methocarbamol, HYDROcodone-acetaminophen,  and methylPREDNISolone. I am also having her start on guaiFENesin-codeine. Additionally, I am having her maintain her calcium citrate-vitamin D, aspirin EC, Fish Oil, multivitamin, Multiple Vitamins-Minerals (PRESERVISION AREDS PO), lisinopril, emtricitabine-tenofovir, meloxicam, atorvastatin, and Paxlovid (300/100).  Meds ordered this encounter  Medications   guaiFENesin-codeine (ROBITUSSIN AC) 100-10 MG/5ML syrup    Sig: Take 5 mLs by mouth 3 (three) times daily as needed for cough.    Dispense:  75 mL    Refill:   0    Order Specific Question:   Supervising Provider    Answer:   Danise Edge A [[id number]]

## 2023-07-15 NOTE — Patient Instructions (Signed)
VISIT SUMMARY:  During your visit, we discussed your ongoing symptoms following your COVID-19 infection, including a persistent cough and fatigue. We also addressed your concerns about returning to your senior living community and your vaccination status. We have made a plan to manage your symptoms and ensure you are up-to-date with your vaccinations.  YOUR PLAN:  -COVID-19: You have been experiencing a persistent cough and fatigue after your COVID-19 infection. We will order a chest x-ray to check for any signs of pneumonia, a lung infection that can sometimes occur after COVID-19. We will also prescribe a cough syrup with codeine for you to use at night for 5 days, and you should continue using Delsym during the day.  -VACCINATION STATUS: You are up-to-date with your COVID-19 vaccines, but it's time for your next vaccine and your annual flu shot. We recommend getting your flu shot in 2 weeks and your next COVID-19 vaccine around Thanksgiving.  INSTRUCTIONS:  Please make sure to take the cough syrup with codeine as prescribed, only at bedtime and as needed. Continue taking Delsym during the day. Schedule your flu shot in 2 weeks and your next COVID-19 vaccine around Thanksgiving. We will contact you with the results of your chest x-ray as soon as they are available.

## 2023-07-15 NOTE — Assessment & Plan Note (Signed)
Persistent cough and fatigue. No signs of pneumonia on auscultation. -Order chest x-ray to rule out early pneumonia. -Prescribe cough syrup with codeine for nighttime use for 5 days, to be taken as needed at bedtime. -Continue Delsym during the day.   Up to date on COVID-19 vaccines, but due for upcoming vaccine and flu shot. -Recommend flu shot in 2 weeks. -Recommend next COVID-19 vaccine around Thanksgiving.

## 2023-07-27 ENCOUNTER — Ambulatory Visit: Payer: Self-pay | Admitting: Podiatry

## 2023-08-14 ENCOUNTER — Ambulatory Visit: Payer: Medicare Other | Admitting: Podiatry

## 2023-08-14 DIAGNOSIS — M2042 Other hammer toe(s) (acquired), left foot: Secondary | ICD-10-CM

## 2023-08-14 DIAGNOSIS — M722 Plantar fascial fibromatosis: Secondary | ICD-10-CM | POA: Diagnosis not present

## 2023-08-14 DIAGNOSIS — Q666 Other congenital valgus deformities of feet: Secondary | ICD-10-CM | POA: Diagnosis not present

## 2023-08-14 DIAGNOSIS — M21619 Bunion of unspecified foot: Secondary | ICD-10-CM | POA: Diagnosis not present

## 2023-08-14 DIAGNOSIS — M2041 Other hammer toe(s) (acquired), right foot: Secondary | ICD-10-CM | POA: Diagnosis not present

## 2023-08-14 DIAGNOSIS — M2170 Unequal limb length (acquired), unspecified site: Secondary | ICD-10-CM

## 2023-08-14 NOTE — Progress Notes (Unsigned)
Subjective:   Patient ID: Melissa Grimes, female   DOB: 67 y.o.   MRN: [id number]   HPI Chief Complaint  Patient presents with   Difficulty Walking    Requesting orthotics to alleviate pain secondary to scoliosis, possible limb length discrepancy, hx hip replacements   The patient presents with a history of multiple foot surgeries since the age of 89, including the removal of the right second toe in January 2021 due to deformity. They report ongoing issues with their feet, including bunions, hammertoes, and a left second toe that is beginning to curl upwards. They deny foot pain, except for discomfort in the left second toe and some plantar fasciitis. They have been using inserts from the [employer] store, which they report as someone helpful.  The patient also has a history of scoliosis and lumbar radiculopathy, with a perceived higher left hip. They have had hip replacement surgery in the past. They express concern about their posture and walking.  She thinks that we can work on her posture, inserts that may be beneficial for her.  She is not interested in further surgical intervention at this time.   Review of Systems  All other systems reviewed and are negative.  Past Medical History:  Diagnosis Date   Arthritis    Complication of anesthesia    difficulty urinating after   Hyperlipidemia    Hypertension    Macular degeneration    Trochanteric bursitis, right hip 10/11/2018    Past Surgical History:  Procedure Laterality Date   BUNIONECTOMY     CESAREAN SECTION     x 2   HERNIA REPAIR Left 11/18/1975   inguinal   OSTEOTOMY  1974 & 1975   toe removal Right 12/08/2021   right second toe   TOTAL HIP ARTHROPLASTY Right 01/18/2013   Procedure: Right TOTAL HIP ARTHROPLASTY ANTERIOR APPROACH;  Surgeon: Kathryne Hitch, MD;  Location: [facility] OR;  Service: Orthopedics;  Laterality: Right;   TOTAL HIP ARTHROPLASTY Left 08/09/2013   Procedure: LEFT TOTAL HIP ARTHROPLASTY  ANTERIOR APPROACH;  Surgeon: Kathryne Hitch, MD;  Location: [facility] OR;  Service: Orthopedics;  Laterality: Left;     Current Outpatient Medications:    aspirin EC 81 MG tablet, Take 81 mg by mouth daily., Disp: , Rfl:    atorvastatin (LIPITOR) 20 MG tablet, TAKE 1 TABLET BY MOUTH ONCE  DAILY, Disp: 100 tablet, Rfl: 2   Calcium Citrate-Vitamin D 500-400 MG-UNIT CHEW, Chew 1 tablet by mouth 2 (two) times daily. , Disp: , Rfl:    emtricitabine-tenofovir (TRUVADA) 200-300 MG tablet, Take 1 tablet by mouth daily., Disp: 90 tablet, Rfl: 0   guaiFENesin-codeine (ROBITUSSIN AC) 100-10 MG/5ML syrup, Take 5 mLs by mouth 3 (three) times daily as needed for cough., Disp: 75 mL, Rfl: 0   lisinopril (ZESTRIL) 20 MG tablet, TAKE 1 TABLET BY MOUTH DAILY, Disp: 100 tablet, Rfl: 1   meloxicam (MOBIC) 7.5 MG tablet, Take 1 tablet (7.5 mg total) by mouth 2 (two) times daily as needed for pain., Disp: 30 tablet, Rfl: 2   Multiple Vitamin (MULTIVITAMIN) tablet, Take 1 tablet by mouth daily., Disp: , Rfl:    Multiple Vitamins-Minerals (PRESERVISION AREDS PO), Take by mouth., Disp: , Rfl:    nirmatrelvir & ritonavir (PAXLOVID, 300/100,) 20 x 150 MG & 10 x 100MG  TBPK, Take per package instructions, Disp: 30 tablet, Rfl: 0   Omega-3 Fatty Acids (FISH OIL) 1000 MG CAPS, Take 1 capsule by mouth daily., Disp: , Rfl:  Allergies  Allergen Reactions   Achromycin [Tetracycline] Hives          Objective:  Physical Exam  General: AAO x3, NAD  Dermatological: Skin is warm, dry and supple bilateral. There are no open sores, no preulcerative lesions, no rash or signs of infection present.  Vascular: Dorsalis Pedis artery and Posterior Tibial artery pedal pulses are 2/4 bilateral with immedate capillary fill time. There is no pain with calf compression, swelling, warmth, erythema.   Neruologic: Grossly intact via light touch bilateral.   Musculoskeletal: Significant flatfoot is present bilaterally.  There is  severe bunions present bilaterally and hammertoes noted.  The right second toe has been amputated as it previously was overlapping the hallux.  The left side is starting to do the same.  She gets some mild discomfort on the plantar aspect calcaneus on insertion of plantar fascia there is no area pinpoint tenderness.  No pain with lateral compression of calcaneus.  MMT 5/5.  Right leg is about half centimeter shorter than the left.       Assessment:   67 year old female with limits discrepancy, flatfoot, digital deformity, bunion.     Plan:  Bunion and Hammertoe Deformities Chronic foot deformities with history of multiple surgeries. Right second toe amputated in January 2021. Left second toe currently causing discomfort due to upward curling. No recent injuries. -We discussed the conservative as well as surgical options.  She is not interested in surgical intervention at this time. -Consider custom orthotics to improve foot structure and potentially slow progression of deformities. -Provide toe crest to help hold toes down and alleviate pressure.  She states this felt great. -Recommend extra depth or double depth shoes to prevent toes from rubbing.  Plantar Fasciitis Mild discomfort in the heel. Currently using over-the-counter inserts from the [employer] store. -Provide worksheet with exercises for plantar fasciitis. -Consider custom orthotics for improved support.  Limb Length Discrepancy Half centimeter discrepancy with right leg shorter than left, potentially contributing to postural issues and exacerbating foot deformities. -Consider adding a slight heel lift to the right shoe when custom orthotics are made to even out limb length.  Scoliosis Longstanding diagnosis, potentially contributing to postural issues and exacerbating foot deformities. -Consider custom orthotics to improve foot structure and potentially improve posture.  Vivi Barrack DPM

## 2023-08-25 ENCOUNTER — Ambulatory Visit: Payer: Medicare Other | Admitting: Family

## 2023-08-25 ENCOUNTER — Other Ambulatory Visit (HOSPITAL_BASED_OUTPATIENT_CLINIC_OR_DEPARTMENT_OTHER): Payer: Medicare Other

## 2023-08-27 ENCOUNTER — Ambulatory Visit (INDEPENDENT_AMBULATORY_CARE_PROVIDER_SITE_OTHER): Payer: Medicare Other | Admitting: Family Medicine

## 2023-08-27 ENCOUNTER — Ambulatory Visit (HOSPITAL_BASED_OUTPATIENT_CLINIC_OR_DEPARTMENT_OTHER)
Admission: RE | Admit: 2023-08-27 | Discharge: 2023-08-27 | Disposition: A | Discharge status: Home / Self Care | Payer: Medicare Other | Source: Ambulatory Visit | Attending: Family | Admitting: Family

## 2023-08-27 ENCOUNTER — Encounter: Payer: Self-pay | Admitting: Family

## 2023-08-27 ENCOUNTER — Other Ambulatory Visit (HOSPITAL_COMMUNITY)
Admission: RE | Admit: 2023-08-27 | Discharge: 2023-08-27 | Disposition: A | Discharge status: Home / Self Care | Payer: Medicare Other | Source: Ambulatory Visit | Attending: Family Medicine | Admitting: Family Medicine

## 2023-08-27 VITALS — BP 119/79 | HR 67 | Temp 97.6°F | Resp 16 | Ht 63.0 in | Wt 134.0 lb

## 2023-08-27 DIAGNOSIS — B9689 Other specified bacterial agents as the cause of diseases classified elsewhere: Secondary | ICD-10-CM

## 2023-08-27 DIAGNOSIS — R239 Unspecified skin changes: Secondary | ICD-10-CM | POA: Diagnosis not present

## 2023-08-27 DIAGNOSIS — Z780 Asymptomatic menopausal state: Secondary | ICD-10-CM | POA: Diagnosis not present

## 2023-08-27 DIAGNOSIS — Z7251 High risk heterosexual behavior: Secondary | ICD-10-CM | POA: Insufficient documentation

## 2023-08-27 DIAGNOSIS — M85831 Other specified disorders of bone density and structure, right forearm: Secondary | ICD-10-CM | POA: Diagnosis not present

## 2023-08-27 DIAGNOSIS — N760 Acute vaginitis: Secondary | ICD-10-CM

## 2023-08-27 DIAGNOSIS — Z23 Encounter for immunization: Secondary | ICD-10-CM | POA: Diagnosis not present

## 2023-08-27 DIAGNOSIS — I10 Essential (primary) hypertension: Secondary | ICD-10-CM

## 2023-08-27 DIAGNOSIS — M8580 Other specified disorders of bone density and structure, unspecified site: Secondary | ICD-10-CM | POA: Insufficient documentation

## 2023-08-27 LAB — COMPREHENSIVE METABOLIC PANEL
ALT: 24 U/L (ref 0–35)
AST: 25 U/L (ref 0–37)
Albumin: 4.6 g/dL (ref 3.5–5.2)
Alkaline Phosphatase: 94 U/L (ref 39–117)
BUN: 19 mg/dL (ref 6–23)
CO2: 31 meq/L (ref 19–32)
Calcium: 9.7 mg/dL (ref 8.4–10.5)
Chloride: 103 meq/L (ref 96–112)
Creatinine, Ser: 0.72 mg/dL (ref 0.40–1.20)
GFR: 85.67 mL/min (ref >60.00)
Glucose, Bld: 98 mg/dL (ref 70–99)
Potassium: 4.4 meq/L (ref 3.5–5.1)
Sodium: 140 meq/L (ref 135–145)
Total Bilirubin: 0.4 mg/dL (ref 0.2–1.2)
Total Protein: 6.7 g/dL (ref 6.0–8.3)

## 2023-08-27 MED ORDER — EMTRICITABINE-TENOFOVIR DF 200-300 MG PO TABS
1.0000 | ORAL_TABLET | Freq: Every day | ORAL | 0 refills | Status: DC
Start: 2023-08-27 — End: 2023-11-25

## 2023-08-27 NOTE — Progress Notes (Signed)
Established Patient Office Visit  Subjective   Patient ID: Melissa Grimes, female    DOB: January 01, 1956  Age: 67 y.o. MRN: [id number]  Chief Complaint  Patient presents with   Medication Refill    Medication refill    Medication Refill    Discussed the use of AI scribe software for clinical note transcription with the patient, who gave verbal consent to proceed.  History of Present Illness   The patient, with a history of hypertension and on a regimen of Lisinopril 20mg , presented for medication refills, specifically Truvada, which they have been taking for approximately two years as a preventative measure against HIV. They reported a recent change in their blood pressure medication dosage, which was lowered six months prior due to fatigue. Since the adjustment, the patient reported feeling less fatigued and overall better.  In addition to the medication refill, the patient also reported a new skin condition that appeared a week ago. They described it as a hypopigmented patch that is neither itchy nor painful. They have not previously seen a dermatologist for regular screenings but expressed interest in doing so.  The patient also requested a flu shot and a full STD panel. They are currently in high-risk sexual circumstances. The patient has been proactive in their sexual health, taking preventative measures and regular testing.          ROS All review of systems negative except what is listed in the HPI    Objective:     BP 119/79   Pulse 67   Temp 97.6 F (36.4 C) (Oral)   Resp 16   Ht 5\' 3"  (1.6 m)   Wt 134 lb (60.8 kg)   LMP 11/17/2010   SpO2 97%   BMI 23.74 kg/m    Physical Exam Vitals reviewed.  Constitutional:      Appearance: Normal appearance.  Cardiovascular:     Rate and Rhythm: Normal rate and regular rhythm.     Heart sounds: Normal heart sounds.  Pulmonary:     Effort: Pulmonary effort is normal.     Breath sounds: Normal breath sounds.   Skin:    General: Skin is warm and dry.     Comments: Small area of hypopigmentation to right forearm, asymptomatic   Neurological:     Mental Status: She is alert and oriented to person, place, and time.  Psychiatric:        Mood and Affect: Mood normal.        Behavior: Behavior normal.        Thought Content: Thought content normal.        Judgment: Judgment normal.        No results found for any visits on 08/27/23.    The ASCVD Risk score (Arnett DK, et al., 2019) failed to calculate for the following reasons:   The patient has a prior MI or stroke diagnosis    Assessment & Plan:   Problem List Items Addressed This Visit       Active Problems   HTN (hypertension) Elevated blood pressure reading today, but patient reports feeling better on the lower dose of Lisinopril (20mg ). Patient reports fatigue on higher doses. -Recheck blood pressure today is good. -Continue Lisinopril 20mg  daily.    Relevant Orders   Comprehensive metabolic panel   High risk sexual behavior - Primary Patient has been on Truvada for HIV prevention. No known HIV positive status. -Refill Truvada prescription. -Order HIV levels today -Order full STD panel including  herpes testing.    Relevant Medications   emtricitabine-tenofovir (TRUVADA) 200-300 MG tablet   Other Relevant Orders   HIV Antibody (routine testing w rflx)   Comprehensive metabolic panel   Cervicovaginal ancillary only   RPR   Herpes Simplex Virus 2(IgG)w/rflx to HSV2 Inhibition   Hepatitis C antibody   Hepatitis B surface antigen   Other Visit Diagnoses     Recent skin changes     New hypopigmented lesion on skin, present for about a week. No associated symptoms. Patient does not have a dermatologist for regular screenings. -Advise patient to monitor lesion and take pictures to track any changes. -Refer to dermatology for full body screening.    Relevant Orders   Ambulatory referral to Dermatology         Return in about 3 months (around 11/27/2023) for routine follow-up.    Clayborne Dana, NP

## 2023-08-28 LAB — CERVICOVAGINAL ANCILLARY ONLY
Bacterial Vaginitis (gardnerella): POSITIVE — AB
Candida Glabrata: NEGATIVE
Candida Vaginitis: NEGATIVE
Chlamydia: NEGATIVE
Comment: NEGATIVE
Comment: NEGATIVE
Comment: NEGATIVE
Comment: NEGATIVE
Comment: NEGATIVE
Comment: NORMAL
Neisseria Gonorrhea: NEGATIVE
Trichomonas: NEGATIVE

## 2023-08-31 ENCOUNTER — Encounter: Payer: Self-pay | Admitting: Family Medicine

## 2023-08-31 MED ORDER — METRONIDAZOLE 500 MG PO TABS
500.0000 mg | ORAL_TABLET | Freq: Two times a day (BID) | ORAL | 0 refills | Status: DC
Start: 2023-08-31 — End: 2023-09-01

## 2023-08-31 NOTE — Addendum Note (Signed)
Addended by: Hyman Hopes B on: 08/31/2023 08:33 AM   Modules accepted: Orders

## 2023-09-01 ENCOUNTER — Ambulatory Visit (INDEPENDENT_AMBULATORY_CARE_PROVIDER_SITE_OTHER): Payer: Medicare Other | Admitting: Family

## 2023-09-01 VITALS — BP 123/88 | HR 93 | Temp 97.6°F | Resp 16 | Ht 63.0 in | Wt 132.0 lb

## 2023-09-01 DIAGNOSIS — R310 Gross hematuria: Secondary | ICD-10-CM

## 2023-09-01 LAB — POC URINALSYSI DIPSTICK (AUTOMATED)
Glucose, UA: POSITIVE — AB
Nitrite, UA: POSITIVE
Protein, UA: POSITIVE — AB
Spec Grav, UA: 1.025 (ref 1.010–1.025)
Urobilinogen, UA: 0.2 U/dL
pH, UA: 6 (ref 5.0–8.0)

## 2023-09-01 MED ORDER — CEPHALEXIN 500 MG PO CAPS
500.0000 mg | ORAL_CAPSULE | Freq: Two times a day (BID) | ORAL | 0 refills | Status: DC
Start: 2023-09-01 — End: 2023-10-13

## 2023-09-01 NOTE — Assessment & Plan Note (Signed)
  Gross hematuria noted yesterday with associated lower pelvic discomfort. No dysuria. Urinalysis shows small amount of blood and moderate leuks suggestive of urinary tract infection (UTI). No back pain suggestive of renal colic. -Start empiric antibiotic therapy for suspected UTI with keflex -Send urine for culture. -Repeat urinalysis in 3 weeks to ensure resolution of microscopic hematuria. -If persistent microscopic hematuria, consider further imaging

## 2023-09-01 NOTE — Progress Notes (Signed)
Subjective:     Patient ID: Melissa Grimes, female    DOB: [birth year], 67 y.o.   MRN: [id number]  Chief Complaint  Patient presents with   Hematuria    Patient reports gross hematuria.     Hematuria    Discussed the use of AI scribe software for clinical note transcription with the patient, who gave verbal consent to proceed.  History of Present Illness   The patient, with a history of urinary tract infections, presents with recurrent hematuria over the past four years. The episodes are characterized by blood on the toilet tissue and pink urine, occurring approximately five times in the past four years. The most recent episode was significant, with the patient describing it as 'like I was menstruating.' The patient denies dysuria, but reports lower pelvic irritation during these episodes. The patient denies any unusual low back pain. The patient's daughter suggested the possibility of kidney stones two years ago, but the patient has not sought medical attention for this issue until now.          Health Maintenance Due  Topic Date Due   COVID-19 Vaccine (6 - 2023-24 season) 07/19/2023    Past Medical History:  Diagnosis Date   Arthritis    Complication of anesthesia    difficulty urinating after   Hyperlipidemia    Hypertension    Macular degeneration    Osteopenia    Trochanteric bursitis, right hip 10/11/2018    Past Surgical History:  Procedure Laterality Date   BUNIONECTOMY     CESAREAN SECTION     x 2   HERNIA REPAIR Left 11/18/1975   inguinal   OSTEOTOMY  1974 & 1975   toe removal Right 12/08/2021   right second toe   TOTAL HIP ARTHROPLASTY Right 01/18/2013   Procedure: Right TOTAL HIP ARTHROPLASTY ANTERIOR APPROACH;  Surgeon: Kathryne Hitch, MD;  Location: [facility] OR;  Service: Orthopedics;  Laterality: Right;   TOTAL HIP ARTHROPLASTY Left 08/09/2013   Procedure: LEFT TOTAL HIP ARTHROPLASTY ANTERIOR APPROACH;  Surgeon: Kathryne Hitch, MD;   Location: [facility] OR;  Service: Orthopedics;  Laterality: Left;    Family History  Problem Relation Age of Onset   Arthritis Mother        osteoarthritis   Hyperlipidemia Mother    Heart disease Mother    Hypertension Mother    Hypothyroidism Mother    Hyperlipidemia Father    Heart disease Father    Stroke Father    Parkinson's disease Father    Obesity Sister        "overweight"   Hypertension Sister    Cancer Maternal Grandfather        lung   Varicose Veins Child    Asthma Child    Migraines Child    Cancer Cousin        breast    Social History   Socioeconomic History   Marital status: Married    Spouse name: Not on file   Number of children: 2   Years of education: Not on file   Highest education level: Associate degree: occupational, Scientist, product/process development, or vocational program  Occupational History   Not on file  Tobacco Use   Smoking status: Never   Smokeless tobacco: Never  Vaping Use   Vaping status: Never Used  Substance and Sexual Activity   Alcohol use: Yes    Alcohol/week: 3.0 standard drinks of alcohol    Types: 3 Glasses of wine per week  Comment: 1-3   Drug use: No   Sexual activity: Yes    Partners: Male  Other Topics Concern   Not on file  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 [employer]    children- grown both in [place]   Enjoys writing christian ed Scientist, clinical (histocompatibility and immunogenetics)   Social Determinants of Health   Financial Resource Strain: Low Risk  (04/05/2023)   Overall Financial Resource Strain (CARDIA)    Difficulty of Paying Living Expenses: Not hard at all  Food Insecurity: No Food Insecurity (08/31/2023)   Hunger Vital Sign    Worried About Running Out of Food in the Last Year: Never true    Ran Out of Food in the Last Year: Never true  Transportation Needs: No Transportation Needs (08/31/2023)   PRAPARE - Administrator, Civil Service (Medical): No    Lack of Transportation (Non-Medical): No   Physical Activity: Insufficiently Active (04/05/2023)   Exercise Vital Sign    Days of Exercise per Week: 2 days    Minutes of Exercise per Session: 40 min  Stress: No Stress Concern Present (04/05/2023)   [employer] of Occupational Health - Occupational Stress Questionnaire    Feeling of Stress : Not at all  Social Connections: Socially Integrated (08/31/2023)   Social Connection and Isolation Panel [NHANES]    Frequency of Communication with Friends and Family: More than three times a week    Frequency of Social Gatherings with Friends and Family: Three times a week    Attends Religious Services: More than 4 times per year    Active Member of Clubs or Organizations: Yes    Attends Banker Meetings: More than 4 times per year    Marital Status: Married  Catering manager Violence: Not At Risk (12/29/2022)   Humiliation, Afraid, Rape, and Kick questionnaire    Fear of Current or Ex-Partner: No    Emotionally Abused: No    Physically Abused: No    Sexually Abused: No    Outpatient Medications Prior to Visit  Medication Sig Dispense Refill   aspirin EC 81 MG tablet Take 81 mg by mouth daily.     atorvastatin (LIPITOR) 20 MG tablet TAKE 1 TABLET BY MOUTH ONCE  DAILY 100 tablet 2   Calcium Citrate-Vitamin D 500-400 MG-UNIT CHEW Chew 1 tablet by mouth 2 (two) times daily.      emtricitabine-tenofovir (TRUVADA) 200-300 MG tablet Take 1 tablet by mouth daily. 90 tablet 0   lisinopril (ZESTRIL) 20 MG tablet TAKE 1 TABLET BY MOUTH DAILY 100 tablet 1   meloxicam (MOBIC) 7.5 MG tablet Take 1 tablet (7.5 mg total) by mouth 2 (two) times daily as needed for pain. 30 tablet 2   Multiple Vitamin (MULTIVITAMIN) tablet Take 1 tablet by mouth daily.     Multiple Vitamins-Minerals (PRESERVISION AREDS PO) Take by mouth.     Omega-3 Fatty Acids (FISH OIL) 1000 MG CAPS Take 1 capsule by mouth daily.     metroNIDAZOLE (FLAGYL) 500 MG tablet Take 1 tablet (500 mg total) by mouth 2 (two)  times daily for 7 days. 14 tablet 0   No facility-administered medications prior to visit.    Allergies  Allergen Reactions   Achromycin [Tetracycline] Hives    Review of Systems  Genitourinary:  Positive for hematuria.       Objective:    Physical Exam Constitutional:      General: She is  not in acute distress.    Appearance: Normal appearance. She is well-developed.  HENT:     Head: Normocephalic and atraumatic.     Right Ear: External ear normal.     Left Ear: External ear normal.  Eyes:     General: No scleral icterus. Neck:     Thyroid: No thyromegaly.  Cardiovascular:     Rate and Rhythm: Normal rate and regular rhythm.     Heart sounds: Normal heart sounds. No murmur heard. Pulmonary:     Effort: Pulmonary effort is normal. No respiratory distress.     Breath sounds: Normal breath sounds. No wheezing.  Abdominal:     Palpations: Abdomen is soft.     Tenderness: There is no abdominal tenderness. There is no right CVA tenderness or left CVA tenderness.  Musculoskeletal:     Cervical back: Neck supple.  Skin:    General: Skin is warm and dry.  Neurological:     Mental Status: She is alert and oriented to person, place, and time.  Psychiatric:        Mood and Affect: Mood normal.        Behavior: Behavior normal.        Thought Content: Thought content normal.        Judgment: Judgment normal.      BP 123/88 (BP Location: Right Arm, Patient Position: Sitting, Cuff Size: Small)   Pulse 93   Temp 97.6 F (36.4 C) (Oral)   Resp 16   Ht 5\' 3"  (1.6 m)   Wt 132 lb (59.9 kg)   LMP 11/17/2010   SpO2 98%   BMI 23.38 kg/m  Wt Readings from Last 3 Encounters:  09/01/23 132 lb (59.9 kg)  08/27/23 134 lb (60.8 kg)  07/15/23 129 lb (58.5 kg)       Assessment & Plan:   Problem List Items Addressed This Visit       Unprioritized   Gross hematuria - Primary     Gross hematuria noted yesterday with associated lower pelvic discomfort. No dysuria.  Urinalysis shows small amount of blood and moderate leuks suggestive of urinary tract infection (UTI). No back pain suggestive of renal colic. -Start empiric antibiotic therapy for suspected UTI with keflex -Send urine for culture. -Repeat urinalysis in 3 weeks to ensure resolution of microscopic hematuria. -If persistent microscopic hematuria, consider further imaging       Relevant Orders   POCT Urinalysis Dipstick (Automated) (Completed)   Urine Culture   Urinalysis, Routine w reflex microscopic    I have discontinued Ellard Artis "Joy"'s metroNIDAZOLE. I am also having her start on cephALEXin. Additionally, I am having her maintain her calcium citrate-vitamin D, aspirin EC, Fish Oil, multivitamin, Multiple Vitamins-Minerals (PRESERVISION AREDS PO), lisinopril, meloxicam, atorvastatin, and emtricitabine-tenofovir.  Meds ordered this encounter  Medications   cephALEXin (KEFLEX) 500 MG capsule    Sig: Take 1 capsule (500 mg total) by mouth 2 (two) times daily.    Dispense:  10 capsule    Refill:  0    Order Specific Question:   Supervising Provider    Answer:   Danise Edge A [[id number]]

## 2023-09-01 NOTE — Patient Instructions (Signed)
VISIT SUMMARY:  During your visit, we discussed your concern about blood in your urine.  You also reported lower pelvic irritation during these episodes, but no unusual back pain.   YOUR PLAN:  -HEMATURIA: Hematuria is the presence of blood in your urine. We suspect that your urinary tract infection (UTI) might be causing this. We will start you on antibiotics to treat the UTI and will send your urine for further testing. We will also repeat the urine test in 3 weeks to make sure the blood in your urine has cleared. If it hasn't, we will need to do further evaluation.  INSTRUCTIONS:  Please take the prescribed antibiotics for your suspected UTI. We will send your urine for further testing. We will also schedule a follow-up appointment in 3 weeks to repeat the urine test. If the blood in your urine hasn't cleared, we may need to do more tests. In addition, we have scheduled a follow-up appointment in 3 months for Truvada monitoring.

## 2023-09-02 LAB — URINE CULTURE
MICRO NUMBER:: 15596571
SPECIMEN QUALITY:: ADEQUATE

## 2023-09-05 LAB — HERPES SIMPLEX VIRUS 2(IGG) W/ REFLEX TO HSV2 INHIBITION: HSV 2 Glycoprotein G Ab, IgG: >23 {index} — ABNORMAL HIGH

## 2023-09-05 LAB — RPR: RPR Ser Ql: NONREACTIVE

## 2023-09-05 LAB — HIV ANTIBODY (ROUTINE TESTING W REFLEX): HIV 1&2 Ab, 4th Generation: NONREACTIVE

## 2023-09-05 LAB — HEPATITIS B SURFACE ANTIGEN: Hepatitis B Surface Ag: NONREACTIVE

## 2023-09-05 LAB — HSV 2 INHIBITION: HSV 2 IGG INHIBITION,IA: POSITIVE — AB

## 2023-09-05 LAB — HEPATITIS C ANTIBODY: Hepatitis C Ab: NONREACTIVE

## 2023-09-07 ENCOUNTER — Telehealth: Payer: Self-pay | Admitting: Podiatry

## 2023-09-07 DIAGNOSIS — L720 Epidermal cyst: Secondary | ICD-10-CM | POA: Diagnosis not present

## 2023-09-07 DIAGNOSIS — L818 Other specified disorders of pigmentation: Secondary | ICD-10-CM | POA: Diagnosis not present

## 2023-09-07 NOTE — Telephone Encounter (Signed)
Pt called checking on status of orthotics.  I scheduled her to pick them.   I scheduled her to pick them up on 10/28. Please call if they are not in.

## 2023-09-14 ENCOUNTER — Other Ambulatory Visit: Payer: Medicare Other

## 2023-09-15 ENCOUNTER — Encounter: Payer: Self-pay | Admitting: Family

## 2023-09-16 MED ORDER — SCOPOLAMINE 1 MG/3DAYS TD PT72: 1.0000 | MEDICATED_PATCH | TRANSDERMAL | 0 refills | Status: DC

## 2023-09-18 NOTE — Telephone Encounter (Signed)
Orthotic order placed today will call and fit when in  [place]

## 2023-09-28 ENCOUNTER — Other Ambulatory Visit: Payer: Self-pay | Admitting: Family

## 2023-10-07 LAB — COLOGUARD: Cologuard: NEGATIVE

## 2023-10-13 ENCOUNTER — Ambulatory Visit (INDEPENDENT_AMBULATORY_CARE_PROVIDER_SITE_OTHER): Payer: Medicare Other | Admitting: Family

## 2023-10-13 ENCOUNTER — Other Ambulatory Visit (HOSPITAL_COMMUNITY)
Admission: RE | Admit: 2023-10-13 | Discharge: 2023-10-13 | Disposition: A | Discharge status: Home / Self Care | Payer: Medicare Other | Source: Ambulatory Visit | Attending: Family | Admitting: Family

## 2023-10-13 VITALS — BP 115/67 | HR 72 | Temp 98.6°F | Resp 16 | Ht 63.0 in | Wt 130.0 lb

## 2023-10-13 DIAGNOSIS — Z7251 High risk heterosexual behavior: Secondary | ICD-10-CM | POA: Insufficient documentation

## 2023-10-13 DIAGNOSIS — N952 Postmenopausal atrophic vaginitis: Secondary | ICD-10-CM | POA: Diagnosis not present

## 2023-10-13 NOTE — Assessment & Plan Note (Signed)
  No reported symptoms. Currently on Truvada for pre-exposure prophylaxis (PrEP). -Order routine STD screening tests. -Refill Truvada prescription.

## 2023-10-13 NOTE — Progress Notes (Signed)
Subjective:     Patient ID: Melissa Grimes, female    DOB: 03-May-1956, 67 y.o.   MRN: [id number]  Chief Complaint  Patient presents with   High risk sexual behavior    Here for STD testing    HPI  Discussed the use of AI scribe software for clinical note transcription with the patient, who gave verbal consent to proceed.  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 She reports a recent episode of sudden onset nausea, fatigue, fever, and vomiting, which resolved after two days. The patient also reports a new symptom of dry, cracking skin on the outer portion of her labia, a symptom she experienced previously when after she stopped menstruating. The patient denies any other symptoms or concerns.          Health Maintenance Due  Topic Date Due   COVID-19 Vaccine (6 - 2023-24 season) 07/19/2023    Past Medical History:  Diagnosis Date   Arthritis    Complication of anesthesia    difficulty urinating after   Hyperlipidemia    Hypertension    Macular degeneration    Osteopenia    Trochanteric bursitis, right hip 10/11/2018    Past Surgical History:  Procedure Laterality Date   BUNIONECTOMY     CESAREAN SECTION     x 2   HERNIA REPAIR Left 11/18/1975   inguinal   OSTEOTOMY  1974 & 1975   toe removal Right 12/08/2021   right second toe   TOTAL HIP ARTHROPLASTY Right 01/18/2013   Procedure: Right TOTAL HIP ARTHROPLASTY ANTERIOR APPROACH;  Surgeon: Kathryne Hitch, MD;  Location: [facility] OR;  Service: Orthopedics;  Laterality: Right;   TOTAL HIP ARTHROPLASTY Left 08/09/2013   Procedure: LEFT TOTAL HIP ARTHROPLASTY ANTERIOR APPROACH;  Surgeon: Kathryne Hitch, MD;  Location: [facility] OR;  Service: Orthopedics;  Laterality: Left;    Family History  Problem Relation Age of Onset   Arthritis Mother        osteoarthritis   Hyperlipidemia  Mother    Heart disease Mother    Hypertension Mother    Hypothyroidism Mother    Hyperlipidemia Father    Heart disease Father    Stroke Father    Parkinson's disease Father    Obesity Sister        "overweight"   Hypertension Sister    Cancer Maternal Grandfather        lung   Varicose Veins Child    Asthma Child    Migraines Child    Cancer Cousin        breast    Social History   Socioeconomic History   Marital status: Married    Spouse name: Not on file   Number of children: 2   Years of education: Not on file   Highest education level: Associate degree: occupational, Scientist, product/process development, or vocational program  Occupational History   Not on file  Tobacco Use   Smoking status: Never   Smokeless tobacco: Never  Vaping Use   Vaping status: Never Used  Substance and Sexual Activity   Alcohol use: Yes    Alcohol/week: 3.0 standard drinks of alcohol    Types: 3 Glasses of wine per week    Comment: 1-3   Drug use: No   Sexual activity: Yes    Partners: Male  Other Topics Concern   Not  on file  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 [employer]    children- grown both in [place]   Enjoys writing christian ed Scientist, clinical (histocompatibility and immunogenetics)   Social Determinants of Health   Financial Resource Strain: Low Risk  (04/05/2023)   Overall Financial Resource Strain (CARDIA)    Difficulty of Paying Living Expenses: Not hard at all  Food Insecurity: No Food Insecurity (08/31/2023)   Hunger Vital Sign    Worried About Running Out of Food in the Last Year: Never true    Ran Out of Food in the Last Year: Never true  Transportation Needs: No Transportation Needs (08/31/2023)   PRAPARE - Administrator, Civil Service (Medical): No    Lack of Transportation (Non-Medical): No  Physical Activity: Insufficiently Active (04/05/2023)   Exercise Vital Sign    Days of Exercise per Week: 2 days    Minutes of Exercise per Session: 40 min  Stress: No  Stress Concern Present (04/05/2023)   [employer] of Occupational Health - Occupational Stress Questionnaire    Feeling of Stress : Not at all  Social Connections: Socially Integrated (08/31/2023)   Social Connection and Isolation Panel [NHANES]    Frequency of Communication with Friends and Family: More than three times a week    Frequency of Social Gatherings with Friends and Family: Three times a week    Attends Religious Services: More than 4 times per year    Active Member of Clubs or Organizations: Yes    Attends Banker Meetings: More than 4 times per year    Marital Status: Married  Catering manager Violence: Not At Risk (12/29/2022)   Humiliation, Afraid, Rape, and Kick questionnaire    Fear of Current or Ex-Partner: No    Emotionally Abused: No    Physically Abused: No    Sexually Abused: No    Outpatient Medications Prior to Visit  Medication Sig Dispense Refill   aspirin EC 81 MG tablet Take 81 mg by mouth daily.     atorvastatin (LIPITOR) 20 MG tablet TAKE 1 TABLET BY MOUTH ONCE  DAILY 100 tablet 2   Calcium Citrate-Vitamin D 500-400 MG-UNIT CHEW Chew 1 tablet by mouth 2 (two) times daily.      emtricitabine-tenofovir (TRUVADA) 200-300 MG tablet Take 1 tablet by mouth daily. 90 tablet 0   lisinopril (ZESTRIL) 20 MG tablet TAKE 1 TABLET BY MOUTH DAILY 100 tablet 2   meloxicam (MOBIC) 7.5 MG tablet Take 1 tablet (7.5 mg total) by mouth 2 (two) times daily as needed for pain. 30 tablet 2   Multiple Vitamin (MULTIVITAMIN) tablet Take 1 tablet by mouth daily.     Multiple Vitamins-Minerals (PRESERVISION AREDS PO) Take by mouth.     Omega-3 Fatty Acids (FISH OIL) 1000 MG CAPS Take 1 capsule by mouth daily.     cephALEXin (KEFLEX) 500 MG capsule Take 1 capsule (500 mg total) by mouth 2 (two) times daily. 10 capsule 0   scopolamine (TRANSDERM-SCOP) 1 MG/3DAYS Place 1 patch (1.5 mg total) onto the skin every 3 (three) days. 3 patch 0   No facility-administered  medications prior to visit.    Allergies  Allergen Reactions   Achromycin [Tetracycline] Hives    ROS    See HPI Objective:    Physical Exam Constitutional:      General: She is not in acute distress.    Appearance: Normal appearance. She is well-developed.  HENT:     Head: Normocephalic and atraumatic.     Right Ear: External ear normal.     Left Ear: External ear normal.  Eyes:     General: No scleral icterus. Neck:     Thyroid: No thyromegaly.  Cardiovascular:     Rate and Rhythm: Normal rate and regular rhythm.     Heart sounds: Normal heart sounds. No murmur heard. Pulmonary:     Effort: Pulmonary effort is normal. No respiratory distress.     Breath sounds: Normal breath sounds. No wheezing.  Genitourinary:    General: Normal vulva.     Comments: I did not notice any cracked skin on vulva Musculoskeletal:     Cervical back: Neck supple.  Skin:    General: Skin is warm and dry.  Neurological:     Mental Status: She is alert and oriented to person, place, and time.  Psychiatric:        Mood and Affect: Mood normal.        Behavior: Behavior normal.        Thought Content: Thought content normal.        Judgment: Judgment normal.       BP 115/67 (BP Location: Right Arm, Patient Position: Sitting, Cuff Size: Normal)   Pulse 72   Temp 98.6 F (37 C) (Oral)   Resp 16   Ht 5\' 3"  (1.6 m)   Wt 130 lb (59 kg)   LMP 11/17/2010   SpO2 (!) 78%   BMI 23.03 kg/m  Wt Readings from Last 3 Encounters:  10/13/23 130 lb (59 kg)  09/01/23 132 lb (59.9 kg)  08/27/23 134 lb (60.8 kg)       Assessment & Plan:   Problem List Items Addressed This Visit       Unprioritized   High risk sexual behavior - Primary     No reported symptoms. Currently on Truvada for pre-exposure prophylaxis (PrEP). -Order routine STD screening tests. -Refill Truvada prescription.       Relevant Orders   HepB+HepC+HIV Panel   RPR   Atrophic vaginitis     History of estrogen  use, but discontinued due to previous stroke while on HRT. Current symptoms of dryness and cracking in the vaginal area. -Recommended over-the-counter Aquaphor for symptomatic relief due to potential risks associated with estrogen cream.        I have discontinued Ellard Artis "Joy"'s cephALEXin and scopolamine. I am also having her maintain her calcium citrate-vitamin D, aspirin EC, Fish Oil, multivitamin, Multiple Vitamins-Minerals (PRESERVISION AREDS PO), meloxicam, atorvastatin, emtricitabine-tenofovir, and lisinopril.  No orders of the defined types were placed in this encounter.

## 2023-10-13 NOTE — Assessment & Plan Note (Signed)
  History of estrogen use, but discontinued due to previous stroke while on HRT. Current symptoms of dryness and cracking in the vaginal area. -Recommended over-the-counter Aquaphor for symptomatic relief due to potential risks associated with estrogen cream.

## 2023-10-13 NOTE — Addendum Note (Signed)
Addended by: Sandford Craze on: 10/13/2023 03:35 PM   Modules accepted: Orders

## 2023-10-14 LAB — HEPB+HEPC+HIV PANEL
HIV Screen 4th Generation wRfx: NONREACTIVE
Hep B C IgM: NEGATIVE
Hep B Core Total Ab: NEGATIVE
Hep B E Ab: NONREACTIVE
Hep B E Ag: NEGATIVE
Hep B Surface Ab, Qual: REACTIVE
Hep C Virus Ab: NONREACTIVE
Hepatitis B Surface Ag: NEGATIVE

## 2023-10-14 LAB — RPR: RPR Ser Ql: NONREACTIVE

## 2023-10-16 DIAGNOSIS — Z1211 Encounter for screening for malignant neoplasm of colon: Secondary | ICD-10-CM | POA: Diagnosis not present

## 2023-10-16 DIAGNOSIS — Z1212 Encounter for screening for malignant neoplasm of rectum: Secondary | ICD-10-CM | POA: Diagnosis not present

## 2023-10-16 LAB — CERVICOVAGINAL ANCILLARY ONLY
Chlamydia: NEGATIVE
Comment: NEGATIVE
Comment: NEGATIVE
Comment: NORMAL
Neisseria Gonorrhea: NEGATIVE
Trichomonas: NEGATIVE

## 2023-10-21 ENCOUNTER — Ambulatory Visit: Payer: Medicare Other

## 2023-10-22 LAB — COLOGUARD: COLOGUARD: NEGATIVE

## 2023-10-22 LAB — EXTERNAL GENERIC LAB PROCEDURE: COLOGUARD: NEGATIVE

## 2023-10-23 ENCOUNTER — Telehealth: Payer: Self-pay

## 2023-10-26 ENCOUNTER — Other Ambulatory Visit: Payer: Medicare Other

## 2023-10-27 NOTE — Progress Notes (Signed)
Patient presents today to pick up custom molded foot orthotics, diagnosed with LLD Bunion by Dr. Ardelle Anton.   Orthotics were dispensed and fit was satisfactory. Reviewed instructions for break-in and wear. Written instructions given to patient.  Patient will follow up as needed.   Addison Bailey Cped, CFo, CFm

## 2023-11-03 ENCOUNTER — Other Ambulatory Visit: Payer: Self-pay | Admitting: Family

## 2023-11-03 DIAGNOSIS — Z1211 Encounter for screening for malignant neoplasm of colon: Secondary | ICD-10-CM

## 2023-11-25 ENCOUNTER — Encounter: Payer: Self-pay | Admitting: Family

## 2023-11-25 DIAGNOSIS — Z7251 High risk heterosexual behavior: Secondary | ICD-10-CM

## 2023-11-25 MED ORDER — EMTRICITABINE-TENOFOVIR DF 200-300 MG PO TABS: 1.0000 | ORAL_TABLET | Freq: Every day | ORAL | 0 refills | Status: DC

## 2023-12-05 ENCOUNTER — Encounter: Payer: Self-pay | Admitting: Family

## 2023-12-07 NOTE — Telephone Encounter (Signed)
Please call pt to schedule OV

## 2023-12-09 ENCOUNTER — Telehealth: Payer: Self-pay | Admitting: Family

## 2023-12-09 NOTE — Telephone Encounter (Signed)
Copied from CRM [id number]. Topic: Medicare AWV >> Dec 09, 2023  2:16 PM Payton Doughty wrote: Reason for CRM: Called LVM 12/09/2023 to schedule AWV. Please schedule Virtual or Telehealth visits ONLY.   Verlee Rossetti; Care Guide Ambulatory Clinical Support [place] [facility] Direct Dial: [phone]

## 2024-01-04 ENCOUNTER — Ambulatory Visit (INDEPENDENT_AMBULATORY_CARE_PROVIDER_SITE_OTHER): Payer: Medicare Other | Admitting: Family

## 2024-01-04 ENCOUNTER — Other Ambulatory Visit (HOSPITAL_COMMUNITY)
Admission: RE | Admit: 2024-01-04 | Discharge: 2024-01-04 | Disposition: A | Discharge status: Home / Self Care | Payer: Medicare Other | Source: Ambulatory Visit | Attending: Family | Admitting: Family

## 2024-01-04 VITALS — BP 133/83 | HR 71 | Temp 98.4°F | Resp 16 | Ht 63.0 in | Wt 135.0 lb

## 2024-01-04 DIAGNOSIS — E785 Hyperlipidemia, unspecified: Secondary | ICD-10-CM

## 2024-01-04 DIAGNOSIS — Z7251 High risk heterosexual behavior: Secondary | ICD-10-CM | POA: Diagnosis present

## 2024-01-04 DIAGNOSIS — I10 Essential (primary) hypertension: Secondary | ICD-10-CM | POA: Diagnosis not present

## 2024-01-04 DIAGNOSIS — Z1231 Encounter for screening mammogram for malignant neoplasm of breast: Secondary | ICD-10-CM

## 2024-01-04 LAB — LIPID PANEL
Cholesterol: 154 mg/dL (ref 0–200)
HDL: 57.8 mg/dL (ref >39.00)
LDL Cholesterol: 57 mg/dL (ref 0–99)
NonHDL: 95.76
Total CHOL/HDL Ratio: 3
Triglycerides: 193 mg/dL — ABNORMAL HIGH (ref 0.0–149.0)
VLDL: 38.6 mg/dL (ref 0.0–40.0)

## 2024-01-04 MED ORDER — EMTRICITABINE-TENOFOVIR DF 200-300 MG PO TABS: 1.0000 | ORAL_TABLET | Freq: Every day | ORAL | 0 refills | Status: DC

## 2024-01-04 NOTE — Progress Notes (Signed)
Subjective:     Patient ID: Melissa Grimes, female    DOB: 1956/10/16, 68 y.o.   MRN: [id number]  Chief Complaint  Patient presents with   Follow-up    HPI  Discussed the use of AI scribe software for clinical note transcription with the patient, who gave verbal consent to proceed.  History of Present Illness   Melissa Grimes "Melissa Grimes" is a 68 year old female who presents for a follow-up visit for left foot pain.  She is currently taking atorvastatin for cholesterol management and has increased her calcium intake with a time-released formulation, reducing her pill burden to one pill. She is also on lisinopril for blood pressure management and Truvada, which she has been using without issues. She mentions a recent experience with a pharmacy coupon that reduced the cost of Truvada significantly.  Her preventive care includes a mammogram that was last done on December 31, 2022, and she is due for another one. She is also planning to undergo a full STD panel, including hepatitis B and syphilis screening, and a vaginal swab for gonorrhea and chlamydia.          Health Maintenance Due  Topic Date Due   Medicare Annual Wellness (AWV)  12/30/2023    Past Medical History:  Diagnosis Date   Arthritis    Complication of anesthesia    difficulty urinating after   Hyperlipidemia    Hypertension    Macular degeneration    Osteopenia    Trochanteric bursitis, right hip 10/11/2018    Past Surgical History:  Procedure Laterality Date   BUNIONECTOMY     CESAREAN SECTION     x 2   HERNIA REPAIR Left 11/18/1975   inguinal   OSTEOTOMY  1974 & 1975   toe removal Right 12/08/2021   right second toe   TOTAL HIP ARTHROPLASTY Right 01/18/2013   Procedure: Right TOTAL HIP ARTHROPLASTY ANTERIOR APPROACH;  Surgeon: Kathryne Hitch, MD;  Location: [facility] OR;  Service: Orthopedics;  Laterality: Right;   TOTAL HIP ARTHROPLASTY Left 08/09/2013   Procedure: LEFT TOTAL HIP ARTHROPLASTY  ANTERIOR APPROACH;  Surgeon: Kathryne Hitch, MD;  Location: [facility] OR;  Service: Orthopedics;  Laterality: Left;    Family History  Problem Relation Age of Onset   Arthritis Mother        osteoarthritis   Hyperlipidemia Mother    Heart disease Mother    Hypertension Mother    Hypothyroidism Mother    Hyperlipidemia Father    Heart disease Father    Stroke Father    Parkinson's disease Father    Obesity Sister        "overweight"   Hypertension Sister    Cancer Maternal Grandfather        lung   Varicose Veins Child    Asthma Child    Migraines Child    Cancer Cousin        breast    Social History   Socioeconomic History   Marital status: Married    Spouse name: Not on file   Number of children: 2   Years of education: Not on file   Highest education level: Associate degree: occupational, Scientist, product/process development, or vocational program  Occupational History   Not on file  Tobacco Use   Smoking status: Never   Smokeless tobacco: Never  Vaping Use   Vaping status: Never Used  Substance and Sexual Activity   Alcohol use: Yes    Alcohol/week: 3.0 standard drinks of  alcohol    Types: 3 Glasses of wine per week    Comment: 1-3   Drug use: No   Sexual activity: Yes    Partners: Male  Other Topics Concern   Not on file  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 [employer]    children- grown both in [place]   Enjoys writing christian ed Scientist, clinical (histocompatibility and immunogenetics)   Social Drivers of Corporate investment banker Strain: Low Risk  (12/28/2023)   Overall Financial Resource Strain (CARDIA)    Difficulty of Paying Living Expenses: Not hard at all  Food Insecurity: No Food Insecurity (12/28/2023)   Hunger Vital Sign    Worried About Running Out of Food in the Last Year: Never true    Ran Out of Food in the Last Year: Never true  Transportation Needs: No Transportation Needs (12/28/2023)   PRAPARE - Administrator, Civil Service  (Medical): No    Lack of Transportation (Non-Medical): No  Physical Activity: Insufficiently Active (12/28/2023)   Exercise Vital Sign    Days of Exercise per Week: 2 days    Minutes of Exercise per Session: 70 min  Stress: No Stress Concern Present (12/28/2023)   [employer] of Occupational Health - Occupational Stress Questionnaire    Feeling of Stress : Not at all  Social Connections: Socially Integrated (12/28/2023)   Social Connection and Isolation Panel [NHANES]    Frequency of Communication with Friends and Family: More than three times a week    Frequency of Social Gatherings with Friends and Family: Three times a week    Attends Religious Services: More than 4 times per year    Active Member of Clubs or Organizations: Yes    Attends Banker Meetings: More than 4 times per year    Marital Status: Married  Catering manager Violence: Not At Risk (12/29/2022)   Humiliation, Afraid, Rape, and Kick questionnaire    Fear of Current or Ex-Partner: No    Emotionally Abused: No    Physically Abused: No    Sexually Abused: No    Outpatient Medications Prior to Visit  Medication Sig Dispense Refill   aspirin EC 81 MG tablet Take 81 mg by mouth daily.     atorvastatin (LIPITOR) 20 MG tablet TAKE 1 TABLET BY MOUTH ONCE  DAILY 100 tablet 2   lisinopril (ZESTRIL) 20 MG tablet TAKE 1 TABLET BY MOUTH DAILY 100 tablet 2   meloxicam (MOBIC) 7.5 MG tablet Take 1 tablet (7.5 mg total) by mouth 2 (two) times daily as needed for pain. 30 tablet 2   Multiple Minerals-Vitamins (CITRACAL MAXIMUM PLUS PO) Take 1,200 mg by mouth.     Multiple Vitamin (MULTIVITAMIN) tablet Take 1 tablet by mouth daily.     Multiple Vitamins-Minerals (PRESERVISION AREDS PO) Take by mouth.     Omega-3 Fatty Acids (FISH OIL) 1000 MG CAPS Take 1 capsule by mouth daily.     emtricitabine-tenofovir (TRUVADA) 200-300 MG tablet Take 1 tablet by mouth daily. 90 tablet 0   Calcium Citrate-Vitamin D 500-400  MG-UNIT CHEW Chew 1 tablet by mouth 2 (two) times daily.      No facility-administered medications prior to visit.    Allergies  Allergen Reactions   Achromycin [Tetracycline] Hives    ROS    See HPI Objective:    Physical Exam Constitutional:      General: She is not in acute distress.  Appearance: Normal appearance. She is well-developed.  HENT:     Head: Normocephalic and atraumatic.     Right Ear: External ear normal.     Left Ear: External ear normal.  Eyes:     General: No scleral icterus. Neck:     Thyroid: No thyromegaly.  Cardiovascular:     Rate and Rhythm: Normal rate and regular rhythm.     Heart sounds: Normal heart sounds. No murmur heard. Pulmonary:     Effort: Pulmonary effort is normal. No respiratory distress.     Breath sounds: Normal breath sounds. No wheezing.  Musculoskeletal:     Cervical back: Neck supple.  Skin:    General: Skin is warm and dry.  Neurological:     Mental Status: She is alert and oriented to person, place, and time.  Psychiatric:        Mood and Affect: Mood normal.        Behavior: Behavior normal.        Thought Content: Thought content normal.        Judgment: Judgment normal.      BP 133/83 (BP Location: Right Arm, Patient Position: Sitting, Cuff Size: Small)   Pulse 71   Temp 98.4 F (36.9 C) (Oral)   Resp 16   Ht 5\' 3"  (1.6 m)   Wt 135 lb (61.2 kg)   LMP 11/17/2010   SpO2 98%   BMI 23.91 kg/m  Wt Readings from Last 3 Encounters:  01/04/24 135 lb (61.2 kg)  10/13/23 130 lb (59 kg)  09/01/23 132 lb (59.9 kg)       Assessment & Plan:   Problem List Items Addressed This Visit       Unprioritized   Hyperlipidemia - Primary   Lab Results  Component Value Date   CHOL 132 02/12/2023   HDL 55.80 02/12/2023   LDLCALC 63 02/12/2023   LDLDIRECT 102.0 08/06/2015   TRIG 65.0 02/12/2023   CHOLHDL 2 02/12/2023   She continues lipitor 20mg .  Will update lipid panel.       Relevant Orders   Lipid  panel   HTN (hypertension)   BP Readings from Last 3 Encounters:  01/04/24 133/83  10/13/23 115/67  09/01/23 123/88   BP stable on lisinopril 20mg . Continue same.       High risk sexual behavior   Continues truvada, update HIV test. Pt self swabbed for ancillary testing.      Relevant Medications   emtricitabine-tenofovir (TRUVADA) 200-300 MG tablet   Other Relevant Orders   HIV antibody (with reflex)   RPR   Hepatitis B Surface AntiGEN   Cervicovaginal ancillary only( [place])   Other Visit Diagnoses       Breast cancer screening by mammogram       Relevant Orders   MM 3D SCREENING MAMMOGRAM BILATERAL BREAST       I have discontinued Ellard Artis "Joy"'s calcium citrate-vitamin D. I am also having her maintain her aspirin EC, Fish Oil, multivitamin, Multiple Vitamins-Minerals (PRESERVISION AREDS PO), meloxicam, atorvastatin, lisinopril, Multiple Minerals-Vitamins (CITRACAL MAXIMUM PLUS PO), and emtricitabine-tenofovir.  Meds ordered this encounter  Medications   emtricitabine-tenofovir (TRUVADA) 200-300 MG tablet    Sig: Take 1 tablet by mouth daily.    Dispense:  90 tablet    Refill:  0    Supervising Provider:   Danise Edge A [[id number]]

## 2024-01-04 NOTE — Assessment & Plan Note (Signed)
Lab Results  Component Value Date   CHOL 132 02/12/2023   HDL 55.80 02/12/2023   LDLCALC 63 02/12/2023   LDLDIRECT 102.0 08/06/2015   TRIG 65.0 02/12/2023   CHOLHDL 2 02/12/2023   She continues lipitor 20mg .  Will update lipid panel.

## 2024-01-04 NOTE — Assessment & Plan Note (Signed)
BP Readings from Last 3 Encounters:  01/04/24 133/83  10/13/23 115/67  09/01/23 123/88   BP stable on lisinopril 20mg . Continue same.

## 2024-01-04 NOTE — Patient Instructions (Signed)
VISIT SUMMARY:  Melissa Grimes, a 68 year old female, visited for a follow-up regarding left foot pain and to review her ongoing treatments for various conditions. She is experiencing significant pain in her left foot, particularly in her toe, which has limited her footwear options. Her current medications include atorvastatin for cholesterol, lisinopril for blood pressure, and Truvada for HIV prevention. She has also increased her calcium intake with a time-released formulation. Preventive care measures, including a mammogram and a full STD panel, were discussed.  YOUR PLAN:  -HYPERLIPIDEMIA: Hyperlipidemia means having high levels of fats (lipids) in the blood, which can increase the risk of heart disease. Your cholesterol levels were normal at the last check almost a year ago. We will check your cholesterol levels again today to ensure they remain stable. Continue taking Atorvastatin 20mg  daily.  -HYPERTENSION: Hypertension is high blood pressure, which can lead to serious health issues if not managed. Your blood pressure is stable with Lisinopril. Continue taking Lisinopril as prescribed.  -HIV PRE-EXPOSURE PROPHYLAXIS (PREP): PrEP is a preventive treatment for people who do not have HIV but are at high risk of getting it. You are stable on Truvada with no reported issues. Continue taking Truvada. We will check a full STD panel today to continue your Truvada approval.  -CALCIUM SUPPLEMENTATION: Calcium supplementation helps maintain strong bones. You have increased your calcium intake with a time-released formulation. Continue with your current calcium supplementation regimen.  -GENERAL HEALTH MAINTENANCE: General health maintenance includes routine check-ups and preventive measures. We will order a mammogram since your last one was on December 31, 2022. You will also perform a self-administered vaginal swab for gonorrhea and chlamydia screening today.  INSTRUCTIONS:  Please follow up in 3  months for a re-evaluation. Ensure to complete the cholesterol check and full STD panel today. Also, schedule your mammogram as discussed.

## 2024-01-04 NOTE — Assessment & Plan Note (Addendum)
Continues truvada, update HIV test. Pt self swabbed for ancillary testing.

## 2024-01-05 DIAGNOSIS — H3554 Dystrophies primarily involving the retinal pigment epithelium: Secondary | ICD-10-CM | POA: Diagnosis not present

## 2024-01-05 DIAGNOSIS — H2513 Age-related nuclear cataract, bilateral: Secondary | ICD-10-CM | POA: Diagnosis not present

## 2024-01-05 DIAGNOSIS — H524 Presbyopia: Secondary | ICD-10-CM | POA: Diagnosis not present

## 2024-01-05 LAB — CERVICOVAGINAL ANCILLARY ONLY
Chlamydia: NEGATIVE
Comment: NEGATIVE
Comment: NEGATIVE
Comment: NORMAL
Neisseria Gonorrhea: NEGATIVE
Trichomonas: NEGATIVE

## 2024-01-05 LAB — HIV ANTIBODY (ROUTINE TESTING W REFLEX): HIV 1&2 Ab, 4th Generation: NONREACTIVE

## 2024-01-05 LAB — HEPATITIS B SURFACE ANTIGEN: Hepatitis B Surface Ag: NONREACTIVE

## 2024-01-05 LAB — RPR: RPR Ser Ql: NONREACTIVE

## 2024-01-07 ENCOUNTER — Encounter: Payer: Self-pay | Admitting: Family

## 2024-01-13 NOTE — Telephone Encounter (Signed)
 NA

## 2024-01-19 ENCOUNTER — Encounter (HOSPITAL_BASED_OUTPATIENT_CLINIC_OR_DEPARTMENT_OTHER): Payer: Self-pay

## 2024-01-19 ENCOUNTER — Ambulatory Visit (HOSPITAL_BASED_OUTPATIENT_CLINIC_OR_DEPARTMENT_OTHER)
Admission: RE | Admit: 2024-01-19 | Discharge: 2024-01-19 | Disposition: A | Discharge status: Home / Self Care | Payer: Medicare Other | Source: Ambulatory Visit | Attending: Family | Admitting: Family

## 2024-01-19 DIAGNOSIS — Z1231 Encounter for screening mammogram for malignant neoplasm of breast: Secondary | ICD-10-CM | POA: Diagnosis not present

## 2024-01-21 DIAGNOSIS — H2513 Age-related nuclear cataract, bilateral: Secondary | ICD-10-CM | POA: Diagnosis not present

## 2024-01-21 DIAGNOSIS — H35433 Paving stone degeneration of retina, bilateral: Secondary | ICD-10-CM | POA: Diagnosis not present

## 2024-01-21 DIAGNOSIS — H353132 Nonexudative age-related macular degeneration, bilateral, intermediate dry stage: Secondary | ICD-10-CM | POA: Diagnosis not present

## 2024-02-15 ENCOUNTER — Telehealth (HOSPITAL_BASED_OUTPATIENT_CLINIC_OR_DEPARTMENT_OTHER): Payer: Self-pay

## 2024-03-22 ENCOUNTER — Telehealth: Payer: Self-pay | Admitting: Family

## 2024-03-22 NOTE — Telephone Encounter (Signed)
 Copied from CRM [id number]. Topic: Medicare AWV >> Mar 22, 2024 11:31 AM Juliana Ocean wrote: Reason for CRM: LVM 03/22/2024 to schedule AWV. Please schedule Virtual or Telehealth visits ONLY  Rosalee Collins; Care Guide Ambulatory Clinical Support [facility] Direct Dial: [phone]

## 2024-04-05 ENCOUNTER — Other Ambulatory Visit: Payer: Self-pay | Admitting: Family

## 2024-04-05 DIAGNOSIS — E785 Hyperlipidemia, unspecified: Secondary | ICD-10-CM

## 2024-04-12 ENCOUNTER — Ambulatory Visit (INDEPENDENT_AMBULATORY_CARE_PROVIDER_SITE_OTHER): Admitting: Family

## 2024-04-12 ENCOUNTER — Other Ambulatory Visit (HOSPITAL_COMMUNITY)
Admission: RE | Admit: 2024-04-12 | Discharge: 2024-04-12 | Disposition: A | Discharge status: Home / Self Care | Source: Ambulatory Visit | Attending: Family | Admitting: Family

## 2024-04-12 VITALS — BP 127/83 | HR 65 | Temp 97.5°F | Resp 16 | Ht 63.0 in | Wt 131.6 lb

## 2024-04-12 DIAGNOSIS — Z7251 High risk heterosexual behavior: Secondary | ICD-10-CM

## 2024-04-12 DIAGNOSIS — Z113 Encounter for screening for infections with a predominantly sexual mode of transmission: Secondary | ICD-10-CM | POA: Insufficient documentation

## 2024-04-12 DIAGNOSIS — M1990 Unspecified osteoarthritis, unspecified site: Secondary | ICD-10-CM | POA: Diagnosis not present

## 2024-04-12 DIAGNOSIS — I10 Essential (primary) hypertension: Secondary | ICD-10-CM | POA: Diagnosis not present

## 2024-04-12 DIAGNOSIS — E785 Hyperlipidemia, unspecified: Secondary | ICD-10-CM | POA: Diagnosis not present

## 2024-04-12 MED ORDER — EMTRICITABINE-TENOFOVIR DF 200-300 MG PO TABS: 1.0000 | ORAL_TABLET | Freq: Every day | ORAL | 0 refills | Status: DC

## 2024-04-12 NOTE — Assessment & Plan Note (Signed)
>>  ASSESSMENT AND PLAN FOR OSTEOARTHRITIS WRITTEN ON 04/12/2024 11:19 AM BY O'SULLIVAN, Joelynn Dust, NP  Stable. Has not needed meloxicam  in a long time.

## 2024-04-12 NOTE — Assessment & Plan Note (Signed)
Bp at goal, continue lisinopril

## 2024-04-12 NOTE — Assessment & Plan Note (Signed)
 Stable. Has not needed meloxicam  in a long time.

## 2024-04-12 NOTE — Assessment & Plan Note (Signed)
 Continues HIV prep therapy.  Ancillary swab to be sent for GC/Chlamydia/Trich. Check HIV, acute hep panel and RPR.

## 2024-04-12 NOTE — Assessment & Plan Note (Signed)
 Lab Results  Component Value Date   CHOL 154 01/04/2024   HDL 57.80 01/04/2024   LDLCALC 57 01/04/2024   LDLDIRECT 102.0 08/06/2015   TRIG 193.0 (H) 01/04/2024   CHOLHDL 3 01/04/2024   Continues lipitor 20mg .  Reinforced dietary modification for hypertriglyceridemia.

## 2024-04-12 NOTE — Patient Instructions (Signed)
 VISIT SUMMARY:  Today, you came in for a follow-up on your medication and to update your HIV test. Your blood pressure is well-controlled, and you have no current symptoms from your arthritis or any issues with your medication.  YOUR PLAN:  HIV PREP THERAPY : Ongoing preventative therapy with Truvada, no issues reported. -Refill Truvada prescription. -Order HIV test. -Perform vaginal swab for other STI screening.  HYPERTENSION: Blood pressure well-controlled at 127/83 mmHg on lisinopril  20 mg, no side effects reported. -Continue lisinopril  20 mg daily.  HYPERLIPIDEMIA: Cholesterol levels generally good, triglycerides slightly elevated, possibly due to dietary factors. -Recheck cholesterol levels in six months.

## 2024-04-12 NOTE — Progress Notes (Signed)
 Subjective:     Patient ID: Melissa Grimes, female    DOB: 1956/07/07, 68 y.o.   MRN: [id number]  Chief Complaint  Patient presents with   Follow-up    HPI  Discussed the use of AI scribe software for clinical note transcription with the patient, who gave verbal consent to proceed.  History of Present Illness CREOLA KROTZ "Kenney Peacemaker" is a 68 year old female who presents for a medication follow-up. She is here for a follow-up on her HIV PREP Therapy medication, Truvada, and to update her HIV test. She has no issues with the medication. Her blood pressure is well-controlled on lisinopril  20 mg daily without side effects. Her arthritis symptoms have been well controlled with rare use of meloxicam .  Her cholesterol was last checked in February, with slightly elevated triglycerides, and she continues atorvastatin . No current issues with allergies or chest pain are noted.       Health Maintenance Due  Topic Date Due   Medicare Annual Wellness (AWV)  12/30/2023   COVID-19 Vaccine (7 - 2024-25 season) 04/12/2024    Past Medical History:  Diagnosis Date   Arthritis    Complication of anesthesia    difficulty urinating after   Hyperlipidemia    Hypertension    Macular degeneration    Osteopenia    Trochanteric bursitis, right hip 10/11/2018    Past Surgical History:  Procedure Laterality Date   BUNIONECTOMY     CESAREAN SECTION     x 2   HERNIA REPAIR Left 11/18/1975   inguinal   OSTEOTOMY  1974 & 1975   toe removal Right 12/08/2021   right second toe   TOTAL HIP ARTHROPLASTY Right 01/18/2013   Procedure: Right TOTAL HIP ARTHROPLASTY ANTERIOR APPROACH;  Surgeon: Arnie Lao, MD;  Location: [facility] OR;  Service: Orthopedics;  Laterality: Right;   TOTAL HIP ARTHROPLASTY Left 08/09/2013   Procedure: LEFT TOTAL HIP ARTHROPLASTY ANTERIOR APPROACH;  Surgeon: Arnie Lao, MD;  Location: [facility] OR;  Service: Orthopedics;  Laterality: Left;    Family History   Problem Relation Age of Onset   Arthritis Mother        osteoarthritis   Hyperlipidemia Mother    Heart disease Mother    Hypertension Mother    Hypothyroidism Mother    Hyperlipidemia Father    Heart disease Father    Stroke Father    Parkinson's disease Father    Obesity Sister        "overweight"   Hypertension Sister    Cancer Maternal Grandfather        lung   Varicose Veins Child    Asthma Child    Migraines Child    Cancer Cousin        breast    Social History   Socioeconomic History   Marital status: Married    Spouse name: Not on file   Number of children: 2   Years of education: Not on file   Highest education level: Associate degree: occupational, Scientist, product/process development, or vocational program  Occupational History   Not on file  Tobacco Use   Smoking status: Never   Smokeless tobacco: Never  Vaping Use   Vaping status: Never Used  Substance and Sexual Activity   Alcohol use: Yes    Alcohol/week: 3.0 standard drinks of alcohol    Types: 3 Glasses of wine per week    Comment: 1-3   Drug use: No   Sexual activity: Yes  Partners: Male  Other Topics Concern   Not on file  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 [employer]    children- grown both in [place]    Enjoys writing christian ed Scientist, clinical (histocompatibility and immunogenetics)   Social Drivers of Health   Financial Resource Strain: Low Risk  (12/28/2023)   Overall Financial Resource Strain (CARDIA)    Difficulty of Paying Living Expenses: Not hard at all  Food Insecurity: No Food Insecurity (12/28/2023)   Hunger Vital Sign    Worried About Running Out of Food in the Last Year: Never true    Ran Out of Food in the Last Year: Never true  Transportation Needs: No Transportation Needs (12/28/2023)   PRAPARE - Administrator, Civil Service (Medical): No    Lack of Transportation (Non-Medical): No  Physical Activity: Insufficiently Active (12/28/2023)   Exercise Vital Sign    Days  of Exercise per Week: 2 days    Minutes of Exercise per Session: 70 min  Stress: No Stress Concern Present (12/28/2023)   [employer] of Occupational Health - Occupational Stress Questionnaire    Feeling of Stress : Not at all  Social Connections: Socially Integrated (12/28/2023)   Social Connection and Isolation Panel [NHANES]    Frequency of Communication with Friends and Family: More than three times a week    Frequency of Social Gatherings with Friends and Family: Three times a week    Attends Religious Services: More than 4 times per year    Active Member of Clubs or Organizations: Yes    Attends Banker Meetings: More than 4 times per year    Marital Status: Married  Catering manager Violence: Not At Risk (12/29/2022)   Humiliation, Afraid, Rape, and Kick questionnaire    Fear of Current or Ex-Partner: No    Emotionally Abused: No    Physically Abused: No    Sexually Abused: No    Outpatient Medications Prior to Visit  Medication Sig Dispense Refill   aspirin  EC 81 MG tablet Take 81 mg by mouth daily.     atorvastatin  (LIPITOR) 20 MG tablet TAKE 1 TABLET BY MOUTH ONCE  DAILY 100 tablet 1   calcium  carbonate (OSCAL) 1500 (600 Ca) MG TABS tablet Take by mouth 2 (two) times daily with a meal.     lisinopril  (ZESTRIL ) 20 MG tablet TAKE 1 TABLET BY MOUTH DAILY 100 tablet 2   meloxicam  (MOBIC ) 7.5 MG tablet Take 1 tablet (7.5 mg total) by mouth 2 (two) times daily as needed for pain. 30 tablet 2   Multiple Vitamin (MULTIVITAMIN) tablet Take 1 tablet by mouth daily.     Omega-3 Fatty Acids (FISH OIL ) 1000 MG CAPS Take 1 capsule by mouth daily.     emtricitabine -tenofovir  (TRUVADA) 200-300 MG tablet Take 1 tablet by mouth daily. 90 tablet 0   Multiple Minerals-Vitamins (CITRACAL MAXIMUM PLUS PO) Take 1,200 mg by mouth.     Multiple Vitamins-Minerals (PRESERVISION AREDS PO) Take by mouth.     No facility-administered medications prior to visit.    Allergies   Allergen Reactions   Achromycin [Tetracycline] Hives    ROS     Objective:     Physical Exam Constitutional:      General: She is not in acute distress.    Appearance: Normal appearance. She is well-developed.  HENT:     Head: Normocephalic and atraumatic.     Right Ear: External ear  normal.     Left Ear: External ear normal.  Eyes:     General: No scleral icterus. Neck:     Thyroid : No thyromegaly.  Cardiovascular:     Rate and Rhythm: Normal rate and regular rhythm.     Heart sounds: Normal heart sounds. No murmur heard. Pulmonary:     Effort: Pulmonary effort is normal. No respiratory distress.     Breath sounds: Normal breath sounds. No wheezing.  Musculoskeletal:     Cervical back: Neck supple.  Skin:    General: Skin is warm and dry.  Neurological:     Mental Status: She is alert and oriented to person, place, and time.  Psychiatric:        Mood and Affect: Mood normal.        Behavior: Behavior normal.        Thought Content: Thought content normal.        Judgment: Judgment normal.      BP 127/83 (BP Location: Right Arm, Patient Position: Sitting, Cuff Size: Normal)   Pulse 65   Temp (!) 97.5 F (36.4 C) (Oral)   Resp 16   Ht 5\' 3"  (1.6 m)   Wt 131 lb 9.6 oz (59.7 kg)   LMP 11/17/2010   SpO2 100%   BMI 23.31 kg/m  Wt Readings from Last 3 Encounters:  04/12/24 131 lb 9.6 oz (59.7 kg)  01/04/24 135 lb (61.2 kg)  10/13/23 130 lb (59 kg)       Assessment & Plan:   Problem List Items Addressed This Visit       Unprioritized   Osteoarthritis   Stable. Has not needed meloxicam  in a long time.       HTN (hypertension)   Bp at goal, continue lisinopril .       High risk sexual behavior - Primary   Continues HIV prep therapy.  Ancillary swab to be sent for GC/Chlamydia/Trich. Check HIV, acute hep panel and RPR.       Relevant Medications   emtricitabine -tenofovir  (TRUVADA) 200-300 MG tablet   Other Relevant Orders   HIV antibody (with  reflex)   Dyslipidemia   Lab Results  Component Value Date   CHOL 154 01/04/2024   HDL 57.80 01/04/2024   LDLCALC 57 01/04/2024   LDLDIRECT 102.0 08/06/2015   TRIG 193.0 (H) 01/04/2024   CHOLHDL 3 01/04/2024   Continues lipitor 20mg .  Reinforced dietary modification for hypertriglyceridemia.       Other Visit Diagnoses       Screening examination for STI       Relevant Orders   RPR   Hepatitis, Acute   Cervicovaginal ancillary only( [place])       I have discontinued Phyllicia E. Utz "Joy"'s Multiple Minerals-Vitamins (CITRACAL MAXIMUM PLUS PO). I am also having her maintain her aspirin  EC, Fish Oil , multivitamin, meloxicam , lisinopril , atorvastatin , calcium  carbonate, and emtricitabine -tenofovir .  Meds ordered this encounter  Medications   emtricitabine -tenofovir  (TRUVADA) 200-300 MG tablet    Sig: Take 1 tablet by mouth daily.    Dispense:  90 tablet    Refill:  0    Supervising Provider:   Randie Bustle A [[id number]]

## 2024-04-13 LAB — HEPATITIS PANEL, ACUTE
Hep A IgM: NONREACTIVE
Hep B C IgM: NONREACTIVE
Hepatitis B Surface Ag: NONREACTIVE
Hepatitis C Ab: NONREACTIVE

## 2024-04-13 LAB — CERVICOVAGINAL ANCILLARY ONLY
Chlamydia: NEGATIVE
Comment: NEGATIVE
Comment: NEGATIVE
Comment: NORMAL
Neisseria Gonorrhea: NEGATIVE
Trichomonas: NEGATIVE

## 2024-04-13 LAB — RPR: RPR Ser Ql: NONREACTIVE

## 2024-04-13 LAB — HIV ANTIBODY (ROUTINE TESTING W REFLEX): HIV 1&2 Ab, 4th Generation: NONREACTIVE

## 2024-04-14 ENCOUNTER — Ambulatory Visit: Payer: Self-pay | Admitting: Family

## 2024-04-14 NOTE — Addendum Note (Signed)
 Addended by: Dorrene Gaucher on: 04/14/2024 02:22 PM   Modules accepted: Level of Service

## 2024-04-26 ENCOUNTER — Ambulatory Visit (INDEPENDENT_AMBULATORY_CARE_PROVIDER_SITE_OTHER): Admitting: Family

## 2024-04-26 VITALS — BP 91/73 | HR 77 | Temp 98.7°F | Resp 12 | Ht 63.0 in | Wt 132.2 lb

## 2024-04-26 DIAGNOSIS — Z0000 Encounter for general adult medical examination without abnormal findings: Secondary | ICD-10-CM

## 2024-04-26 DIAGNOSIS — W57XXXA Bitten or stung by nonvenomous insect and other nonvenomous arthropods, initial encounter: Secondary | ICD-10-CM

## 2024-04-26 DIAGNOSIS — J069 Acute upper respiratory infection, unspecified: Secondary | ICD-10-CM | POA: Diagnosis not present

## 2024-04-26 DIAGNOSIS — S60562A Insect bite (nonvenomous) of left hand, initial encounter: Secondary | ICD-10-CM | POA: Diagnosis not present

## 2024-04-26 MED ORDER — ALBUTEROL SULFATE HFA 108 (90 BASE) MCG/ACT IN AERS: 2.0000 | INHALATION_SPRAY | Freq: Four times a day (QID) | RESPIRATORY_TRACT | 0 refills | Status: DC | PRN

## 2024-04-26 MED ORDER — MUPIROCIN 2 % EX OINT: 1.0000 | TOPICAL_OINTMENT | Freq: Two times a day (BID) | CUTANEOUS | 0 refills | Status: DC

## 2024-04-26 NOTE — Progress Notes (Unsigned)
 Subjective:     Patient ID: Melissa Grimes, female    DOB: September 24, 1956, 68 y.o.   MRN: [id number]  Chief Complaint  Patient presents with   Cough    Covid test negative yesterday- started Saturday nightg Took delsym and guaif/codeine  syrup    head and nose stopped up    HPI  Discussed the use of AI scribe software for clinical note transcription with the patient, who gave verbal consent to proceed.  History of Present Illness  Melissa Grimes is a 68 year old female who presents with a persistent cough and skin infection.  She developed a 'bark' cough on Sunday evening. The cough worsens when lying down, affecting her sleep. She uses an albuterol  inhaler and recently found cough medicine, both of which have been effective. She tested negative for COVID-19 yesterday. There is no drainage, throat tickle, high fever, or shortness of breath.  She has a skin infection on her hand, initially caused by a bug bite and previously affected by poison ivy. The area has flared up and is painful. Triple antibiotic ointment has not improved the condition.  She is taking a 24-hour non-drowsy allergy medication, likely a generic form of Claritin, and uses an albuterol  inhaler for her cough.      Health Maintenance Due  Topic Date Due   Medicare Annual Wellness (AWV)  12/30/2023   COVID-19 Vaccine (7 - 2024-25 season) 04/12/2024    Past Medical History:  Diagnosis Date   Arthritis    Complication of anesthesia    difficulty urinating after   Hyperlipidemia    Hypertension    Macular degeneration    Osteopenia    Trochanteric bursitis, right hip 10/11/2018    Past Surgical History:  Procedure Laterality Date   BUNIONECTOMY     CESAREAN SECTION     x 2   HERNIA REPAIR Left 11/18/1975   inguinal   OSTEOTOMY  1974 & 1975   toe removal Right 12/08/2021   right second toe   TOTAL HIP ARTHROPLASTY Right 01/18/2013   Procedure: Right TOTAL HIP ARTHROPLASTY ANTERIOR  APPROACH;  Surgeon: Arnie Lao, MD;  Location: [facility] OR;  Service: Orthopedics;  Laterality: Right;   TOTAL HIP ARTHROPLASTY Left 08/09/2013   Procedure: LEFT TOTAL HIP ARTHROPLASTY ANTERIOR APPROACH;  Surgeon: Arnie Lao, MD;  Location: [facility] OR;  Service: Orthopedics;  Laterality: Left;    Family History  Problem Relation Age of Onset   Arthritis Mother        osteoarthritis   Hyperlipidemia Mother    Heart disease Mother    Hypertension Mother    Hypothyroidism Mother    Hyperlipidemia Father    Heart disease Father    Stroke Father    Parkinson's disease Father    Obesity Sister        overweight   Hypertension Sister    Cancer Maternal Grandfather        lung   Varicose Veins Child    Asthma Child    Migraines Child    Cancer Cousin        breast    Social History   Socioeconomic History   Marital status: Married    Spouse name: Not on file   Number of children: 2   Years of education: Not on file   Highest education level: Associate degree: occupational, Scientist, product/process development, or vocational program  Occupational History   Not on file  Tobacco Use   Smoking status: Never  Smokeless tobacco: Never  Vaping Use   Vaping status: Never Used  Substance and Sexual Activity   Alcohol use: Yes    Alcohol/week: 3.0 standard drinks of alcohol    Types: 3 Glasses of wine per week    Comment: 1-3   Drug use: No   Sexual activity: Yes    Partners: Male  Other Topics Concern   Not on file  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 [employer]    children- grown both in [place]    Enjoys writing christian ed Scientist, clinical (histocompatibility and immunogenetics)   Social Drivers of Corporate investment banker Strain: Low Risk  (12/28/2023)   Overall Financial Resource Strain (CARDIA)    Difficulty of Paying Living Expenses: Not hard at all  Food Insecurity: No Food Insecurity (12/28/2023)   Hunger Vital Sign    Worried About Running Out of Food in the  Last Year: Never true    Ran Out of Food in the Last Year: Never true  Transportation Needs: No Transportation Needs (12/28/2023)   PRAPARE - Administrator, Civil Service (Medical): No    Lack of Transportation (Non-Medical): No  Physical Activity: Insufficiently Active (12/28/2023)   Exercise Vital Sign    Days of Exercise per Week: 2 days    Minutes of Exercise per Session: 70 min  Stress: No Stress Concern Present (12/28/2023)   [employer] of Occupational Health - Occupational Stress Questionnaire    Feeling of Stress : Not at all  Social Connections: Socially Integrated (12/28/2023)   Social Connection and Isolation Panel    Frequency of Communication with Friends and Family: More than three times a week    Frequency of Social Gatherings with Friends and Family: Three times a week    Attends Religious Services: More than 4 times per year    Active Member of Clubs or Organizations: Yes    Attends Banker Meetings: More than 4 times per year    Marital Status: Married  Catering manager Violence: Not At Risk (12/29/2022)   Humiliation, Afraid, Rape, and Kick questionnaire    Fear of Current or Ex-Partner: No    Emotionally Abused: No    Physically Abused: No    Sexually Abused: No    Outpatient Medications Prior to Visit  Medication Sig Dispense Refill   aspirin  EC 81 MG tablet Take 81 mg by mouth daily.     atorvastatin  (LIPITOR) 20 MG tablet TAKE 1 TABLET BY MOUTH ONCE  DAILY 100 tablet 1   emtricitabine -tenofovir  (TRUVADA) 200-300 MG tablet Take 1 tablet by mouth daily. 90 tablet 0   lisinopril  (ZESTRIL ) 20 MG tablet TAKE 1 TABLET BY MOUTH DAILY 100 tablet 2   meloxicam  (MOBIC ) 7.5 MG tablet Take 1 tablet (7.5 mg total) by mouth 2 (two) times daily as needed for pain. 30 tablet 2   Multiple Vitamins-Minerals (CENTRUM SILVER 50+WOMEN PO) Take 1 tablet by mouth daily.     Multiple Vitamins-Minerals (CITRACAL +D3 PO) Take 1 tablet by mouth in the  morning and at bedtime. 1200mg  slow release     Multiple Vitamins-Minerals (PRESERVISION AREDS 2) CAPS Take 1 capsule by mouth in the morning and at bedtime.     Omega-3 Fatty Acids (FISH OIL ) 1000 MG CAPS Take 1 capsule by mouth daily.     calcium  carbonate (OSCAL) 1500 (600 Ca) MG TABS tablet Take by mouth 2 (two) times daily with a meal.  Multiple Vitamin (MULTIVITAMIN) tablet Take 1 tablet by mouth daily.     No facility-administered medications prior to visit.    Allergies  Allergen Reactions   Achromycin [Tetracycline] Hives    ROS See HPI    Objective:     Physical Exam Constitutional:      General: She is not in acute distress.    Appearance: Normal appearance. She is well-developed.  HENT:     Head: Normocephalic and atraumatic.     Right Ear: Ear canal and external ear normal. There is impacted cerumen.     Left Ear: Tympanic membrane, ear canal and external ear normal.     Mouth/Throat:     Mouth: Mucous membranes are moist.     Pharynx: Oropharynx is clear. No pharyngeal swelling, oropharyngeal exudate or posterior oropharyngeal erythema.   Eyes:     General: No scleral icterus.  Neck:     Thyroid : No thyromegaly.   Cardiovascular:     Rate and Rhythm: Normal rate and regular rhythm.     Heart sounds: Normal heart sounds. No murmur heard. Pulmonary:     Effort: Pulmonary effort is normal. No respiratory distress.     Breath sounds: Normal breath sounds. No wheezing.   Musculoskeletal:     Cervical back: Neck supple.  Lymphadenopathy:     Cervical: No cervical adenopathy.   Skin:    General: Skin is warm and dry.   Neurological:     Mental Status: She is alert and oriented to person, place, and time.   Psychiatric:        Mood and Affect: Mood normal.        Behavior: Behavior normal.        Thought Content: Thought content normal.        Judgment: Judgment normal.       BP 91/73 (BP Location: Right Arm, Patient Position: Sitting, Cuff  Size: Normal)   Pulse 77   Temp 98.7 F (37.1 C) (Oral)   Resp 12   Ht 5' 3 (1.6 m)   Wt 132 lb 3.2 oz (60 kg)   LMP 11/17/2010   SpO2 95%   BMI 23.42 kg/m  Wt Readings from Last 3 Encounters:  04/26/24 132 lb 3.2 oz (60 kg)  04/12/24 131 lb 9.6 oz (59.7 kg)  01/04/24 135 lb (61.2 kg)       Assessment & Plan:   Problem List Items Addressed This Visit       Unprioritized   Viral URI with cough    Barking cough likely viral. COVID-19 negative. Inhaler and cough medicine effective. - Continue cough medicine as needed. - Continue allergy medication. - Report if symptoms persist beyond seven days, worsen, or if high fever or shortness of breath develops. - Prescribe albuterol  inhaler.       Relevant Medications   mupirocin ointment (BACTROBAN) 2 %   Preventative health care    General Health Maintenance Due for Medicare wellness visit and physical exam. Explained differences between visits. - Schedule Medicare wellness visit. - Schedule physical exam.      Insect bite of left hand - Primary    Insect bite with localized infection. MRSA unlikely. - Prescribe topical antibiotic ointment. - Monitor for worsening redness or symptoms      Relevant Medications   mupirocin ointment (BACTROBAN) 2 %    I have discontinued Melissa Grimes's multivitamin and calcium  carbonate. I am also having her start on albuterol  and mupirocin ointment.  Additionally, I am having her maintain her aspirin  EC, Fish Oil , meloxicam , lisinopril , atorvastatin , emtricitabine -tenofovir , PreserVision AREDS 2, Multiple Vitamins-Minerals (CITRACAL +D3 PO), and Multiple Vitamins-Minerals (CENTRUM SILVER 50+WOMEN PO).  Meds ordered this encounter  Medications   albuterol  (VENTOLIN  HFA) 108 (90 Base) MCG/ACT inhaler    Sig: Inhale 2 puffs into the lungs every 6 (six) hours as needed for wheezing or shortness of breath.    Dispense:  8 g    Refill:  0    Supervising Provider:   Randie Bustle A [[id number]]   mupirocin ointment (BACTROBAN) 2 %    Sig: Apply 1 Application topically 2 (two) times daily.    Dispense:  22 g    Refill:  0    Supervising Provider:   Randie Bustle A [[id number]]

## 2024-04-28 NOTE — Assessment & Plan Note (Signed)
  Barking cough likely viral. COVID-19 negative. Inhaler and cough medicine effective. - Continue cough medicine as needed. - Continue allergy medication. - Report if symptoms persist beyond seven days, worsen, or if high fever or shortness of breath develops. - Prescribe albuterol  inhaler.

## 2024-04-28 NOTE — Assessment & Plan Note (Signed)
  Insect bite with localized infection. MRSA unlikely. - Prescribe topical antibiotic ointment. - Monitor for worsening redness or symptoms

## 2024-04-28 NOTE — Assessment & Plan Note (Signed)
  General Health Maintenance Due for Medicare wellness visit and physical exam. Explained differences between visits. - Schedule Medicare wellness visit. - Schedule physical exam.

## 2024-05-02 ENCOUNTER — Ambulatory Visit (HOSPITAL_BASED_OUTPATIENT_CLINIC_OR_DEPARTMENT_OTHER)
Admission: RE | Admit: 2024-05-02 | Discharge: 2024-05-02 | Disposition: A | Discharge status: Home / Self Care | Source: Ambulatory Visit | Attending: Family | Admitting: Family

## 2024-05-02 ENCOUNTER — Telehealth: Payer: Self-pay

## 2024-05-02 DIAGNOSIS — R059 Cough, unspecified: Secondary | ICD-10-CM

## 2024-05-02 MED ORDER — PREDNISONE 10 MG PO TABS: ORAL_TABLET | ORAL | 0 refills | Status: DC

## 2024-05-02 MED ORDER — GUAIFENESIN-CODEINE 100-10 MG/5ML PO SOLN: 5.0000 mL | Freq: Three times a day (TID) | ORAL | 0 refills | Status: DC | PRN

## 2024-05-02 NOTE — Telephone Encounter (Signed)
 Copied from CRM [id number]. Topic: General - Other >> May 02, 2024 10:41 AM Howard Macho wrote: Reason for CRM: patient called stating she saw the doctor on 6/10 for a cough but her cough is not getting better so she want to see if she can get a refill on a cough medication called guaifenesin /codeine  sent to [facility]  CB [phone]

## 2024-05-02 NOTE — Telephone Encounter (Signed)
 I sent an rx for the cough syrup as well as some prednisone . I would also like for her to complete a chest x-ray as soon as possible so we can make sure she is not developing pneumonia.

## 2024-05-02 NOTE — Telephone Encounter (Signed)
 Patient notified of this information, new prescriptions and to come in for x-ray. She will come in today

## 2024-05-03 ENCOUNTER — Ambulatory Visit: Payer: Self-pay | Admitting: Family

## 2024-05-06 ENCOUNTER — Ambulatory Visit (INDEPENDENT_AMBULATORY_CARE_PROVIDER_SITE_OTHER): Admitting: Family

## 2024-05-06 VITALS — BP 116/75 | HR 70 | Temp 97.8°F | Resp 16 | Ht 63.0 in | Wt 131.0 lb

## 2024-05-06 DIAGNOSIS — R052 Subacute cough: Secondary | ICD-10-CM | POA: Diagnosis not present

## 2024-05-06 NOTE — Assessment & Plan Note (Signed)
  Persistent cough for 12 days, likely viral. Chest x-ray negative for pneumonia. No evidence bacterial infection. Post-nasal drip contributes to symptoms. Environmental factors may exacerbate. - Continue prednisone  as prescribed. - Continue albuterol  inhaler as needed. - Continue over-the-counter allergy medication for post-nasal drip. - Continue Mucinex  every 12 hours.

## 2024-05-06 NOTE — Patient Instructions (Signed)
 VISIT SUMMARY:  Today, we discussed your persistent cough and nasal drainage, which have been ongoing for 12 days. Your chest x-ray was clear, and you are currently on prednisone , which has shown some improvement. We reviewed your current medications and provided additional recommendations to help manage your symptoms.  YOUR PLAN:  ACUTE BRONCHITIS: You have a persistent cough likely due to a viral infection, with post-nasal drip contributing to your symptoms. Your chest x-ray was negative for pneumonia, and there is no bacterial infection. -Continue taking prednisone  as prescribed. -Use your albuterol  inhaler as needed for relief. -Keep taking your over-the-counter allergy medication to help with post-nasal drip. -Continue taking Mucinex  every 12 hours.

## 2024-05-06 NOTE — Progress Notes (Signed)
 Subjective:     Patient ID: Melissa Grimes, female    DOB: 1956/07/24, 68 y.o.   MRN: [id number]  Chief Complaint  Patient presents with   URI    Here for follow up of viral URI, still coughing     HPI  Discussed the use of AI scribe software for clinical note transcription with the patient, who gave verbal consent to proceed.  History of Present Illness  Melissa Grimes is a 68 year old female with a history of bronchitis who presents with persistent cough and nasal drainage.  She has had a persistent cough for 12 days, starting around April 24, 2024. A chest x-ray on May 02, 2024, was negative for pneumonia. She is on her third day of prednisone  treatment, with some improvement noted. The cough is less loud but still present, with a 'crunching' sensation and constant drainage. An albuterol  inhaler provides some relief.  Nasal drainage is constant and irritating to her cough. She takes Mucinex  every 12 hours and an over-the-counter allergy medication. There is no sinus pain or pressure, but occasional epistaxis from the right nostril occurs. Ocean spray provides temporary relief. The drainage is mostly clear with some yellow tint. She notes congestion in her left ear.  Current medications include prednisone , albuterol  inhaler, Nyquil, codeine  at night, Mucinex , and an over-the-counter allergy medication. Codeine  improves sleep but causes drowsiness, so it is only used at night.     Health Maintenance Due  Topic Date Due   Medicare Annual Wellness (AWV)  12/30/2023   COVID-19 Vaccine (7 - 2024-25 season) 04/12/2024    Past Medical History:  Diagnosis Date   Arthritis    Complication of anesthesia    difficulty urinating after   Hyperlipidemia    Hypertension    Macular degeneration    Osteopenia    Trochanteric bursitis, right hip 10/11/2018    Past Surgical History:  Procedure Laterality Date   BUNIONECTOMY     CESAREAN SECTION     x 2   HERNIA REPAIR  Left 11/18/1975   inguinal   OSTEOTOMY  1974 & 1975   toe removal Right 12/08/2021   right second toe   TOTAL HIP ARTHROPLASTY Right 01/18/2013   Procedure: Right TOTAL HIP ARTHROPLASTY ANTERIOR APPROACH;  Surgeon: Arnie Lao, MD;  Location: [facility] OR;  Service: Orthopedics;  Laterality: Right;   TOTAL HIP ARTHROPLASTY Left 08/09/2013   Procedure: LEFT TOTAL HIP ARTHROPLASTY ANTERIOR APPROACH;  Surgeon: Arnie Lao, MD;  Location: [facility];  Service: Orthopedics;  Laterality: Left;    Family History  Problem Relation Age of Onset   Arthritis Mother        osteoarthritis   Hyperlipidemia Mother    Heart disease Mother    Hypertension Mother    Hypothyroidism Mother    Hyperlipidemia Father    Heart disease Father    Stroke Father    Parkinson's disease Father    Obesity Sister        overweight   Hypertension Sister    Cancer Maternal Grandfather        lung   Varicose Veins Child    Asthma Child    Migraines Child    Cancer Cousin        breast    Social History   Socioeconomic History   Marital status: Married    Spouse name: Not on file   Number of children: 2   Years of education: Not on file  Highest education level: Associate degree: occupational, Scientist, product/process development, or vocational program  Occupational History   Not on file  Tobacco Use   Smoking status: Never   Smokeless tobacco: Never  Vaping Use   Vaping status: Never Used  Substance and Sexual Activity   Alcohol use: Yes    Alcohol/week: 3.0 standard drinks of alcohol    Types: 3 Glasses of wine per week    Comment: 1-3   Drug use: No   Sexual activity: Yes    Partners: Male  Other Topics Concern   Not on file  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 [employer]    children- grown both in [place]    Enjoys writing christian ed Scientist, clinical (histocompatibility and immunogenetics)   Social Drivers of Corporate investment banker Strain: Low Risk  (12/28/2023)   Overall  Financial Resource Strain (CARDIA)    Difficulty of Paying Living Expenses: Not hard at all  Food Insecurity: No Food Insecurity (12/28/2023)   Hunger Vital Sign    Worried About Running Out of Food in the Last Year: Never true    Ran Out of Food in the Last Year: Never true  Transportation Needs: No Transportation Needs (12/28/2023)   PRAPARE - Administrator, Civil Service (Medical): No    Lack of Transportation (Non-Medical): No  Physical Activity: Insufficiently Active (12/28/2023)   Exercise Vital Sign    Days of Exercise per Week: 2 days    Minutes of Exercise per Session: 70 min  Stress: No Stress Concern Present (12/28/2023)   [employer] of Occupational Health - Occupational Stress Questionnaire    Feeling of Stress : Not at all  Social Connections: Socially Integrated (12/28/2023)   Social Connection and Isolation Panel    Frequency of Communication with Friends and Family: More than three times a week    Frequency of Social Gatherings with Friends and Family: Three times a week    Attends Religious Services: More than 4 times per year    Active Member of Clubs or Organizations: Yes    Attends Banker Meetings: More than 4 times per year    Marital Status: Married  Catering manager Violence: Not At Risk (12/29/2022)   Humiliation, Afraid, Rape, and Kick questionnaire    Fear of Current or Ex-Partner: No    Emotionally Abused: No    Physically Abused: No    Sexually Abused: No    Outpatient Medications Prior to Visit  Medication Sig Dispense Refill   albuterol  (VENTOLIN  HFA) 108 (90 Base) MCG/ACT inhaler Inhale 2 puffs into the lungs every 6 (six) hours as needed for wheezing or shortness of breath. 8 g 0   aspirin  EC 81 MG tablet Take 81 mg by mouth daily.     atorvastatin  (LIPITOR) 20 MG tablet TAKE 1 TABLET BY MOUTH ONCE  DAILY 100 tablet 1   emtricitabine -tenofovir  (TRUVADA) 200-300 MG tablet Take 1 tablet by mouth daily. 90 tablet 0    guaiFENesin -codeine  100-10 MG/5ML syrup Take 5 mLs by mouth 3 (three) times daily as needed for cough. 75 mL 0   lisinopril  (ZESTRIL ) 20 MG tablet TAKE 1 TABLET BY MOUTH DAILY 100 tablet 2   meloxicam  (MOBIC ) 7.5 MG tablet Take 1 tablet (7.5 mg total) by mouth 2 (two) times daily as needed for pain. 30 tablet 2   Multiple Vitamins-Minerals (CENTRUM SILVER 50+WOMEN PO) Take 1 tablet by mouth daily.  Multiple Vitamins-Minerals (CITRACAL +D3 PO) Take 1 tablet by mouth in the morning and at bedtime. 1200mg  slow release     Multiple Vitamins-Minerals (PRESERVISION AREDS 2) CAPS Take 1 capsule by mouth in the morning and at bedtime.     mupirocin  ointment (BACTROBAN ) 2 % Apply 1 Application topically 2 (two) times daily. 22 g 0   Omega-3 Fatty Acids (FISH OIL ) 1000 MG CAPS Take 1 capsule by mouth daily.     predniSONE  (DELTASONE ) 10 MG tablet 4 tabs by mouth once daily for 2 days, then 3 tabs daily x 2 days, then 2 tabs daily x 2 days, then 1 tab daily x 2 days 20 tablet 0   No facility-administered medications prior to visit.    Allergies  Allergen Reactions   Achromycin [Tetracycline] Hives    ROS     Objective:    Physical Exam Constitutional:      General: She is not in acute distress.    Appearance: Normal appearance. She is well-developed.  HENT:     Head: Normocephalic and atraumatic.     Right Ear: External ear normal. There is impacted cerumen.     Left Ear: External ear normal.     Ears:     Comments: L TM- note clear fluid behind TM  Eyes:     General: No scleral icterus.  Neck:     Thyroid : No thyromegaly.   Cardiovascular:     Rate and Rhythm: Normal rate and regular rhythm.     Heart sounds: Normal heart sounds. No murmur heard. Pulmonary:     Effort: Pulmonary effort is normal. No respiratory distress.     Breath sounds: Normal breath sounds. No wheezing.   Musculoskeletal:     Cervical back: Neck supple.   Skin:    General: Skin is warm and dry.    Neurological:     Mental Status: She is alert and oriented to person, place, and time.   Psychiatric:        Mood and Affect: Mood normal.        Behavior: Behavior normal.        Thought Content: Thought content normal.        Judgment: Judgment normal.      BP 116/75 (BP Location: Right Arm, Patient Position: Sitting, Cuff Size: Small)   Pulse 70   Temp 97.8 F (36.6 C) (Oral)   Resp 16   Ht 5' 3 (1.6 m)   Wt 131 lb (59.4 kg)   LMP 11/17/2010   SpO2 97%   BMI 23.21 kg/m  Wt Readings from Last 3 Encounters:  05/06/24 131 lb (59.4 kg)  04/26/24 132 lb 3.2 oz (60 kg)  04/12/24 131 lb 9.6 oz (59.7 kg)       Assessment & Plan:   Problem List Items Addressed This Visit       Unprioritized   Subacute cough - Primary    Persistent cough for 12 days, likely viral. Chest x-ray negative for pneumonia. No evidence bacterial infection. Post-nasal drip contributes to symptoms. Environmental factors may exacerbate. - Continue prednisone  as prescribed. - Continue albuterol  inhaler as needed. - Continue over-the-counter allergy medication for post-nasal drip. - Continue Mucinex  every 12 hours.       I am having Alfonso E. Guarisco Joy maintain her aspirin  EC, Fish Oil , meloxicam , lisinopril , atorvastatin , emtricitabine -tenofovir , PreserVision AREDS 2, Multiple Vitamins-Minerals (CITRACAL +D3 PO), Multiple Vitamins-Minerals (CENTRUM SILVER 50+WOMEN PO), albuterol , mupirocin  ointment, predniSONE , and guaiFENesin -codeine .  No  orders of the defined types were placed in this encounter.

## 2024-06-09 ENCOUNTER — Other Ambulatory Visit: Payer: Self-pay | Admitting: Family

## 2024-07-01 ENCOUNTER — Encounter (INDEPENDENT_AMBULATORY_CARE_PROVIDER_SITE_OTHER): Payer: Self-pay | Admitting: Family

## 2024-07-01 DIAGNOSIS — R3989 Other symptoms and signs involving the genitourinary system: Secondary | ICD-10-CM | POA: Diagnosis not present

## 2024-07-01 MED ORDER — VALACYCLOVIR HCL 1 G PO TABS: 1000.0000 mg | ORAL_TABLET | Freq: Two times a day (BID) | ORAL | 0 refills | Status: DC

## 2024-07-01 NOTE — Telephone Encounter (Signed)

## 2024-07-01 NOTE — Telephone Encounter (Signed)
 Pt called requesting within the hour. She was told that that could not be promised.

## 2024-07-01 NOTE — Telephone Encounter (Signed)
 Pharmacy information uploaded

## 2024-07-02 ENCOUNTER — Encounter: Payer: Self-pay | Admitting: Family

## 2024-07-04 ENCOUNTER — Other Ambulatory Visit: Payer: Self-pay

## 2024-07-04 DIAGNOSIS — R3989 Other symptoms and signs involving the genitourinary system: Secondary | ICD-10-CM

## 2024-07-04 MED ORDER — VALACYCLOVIR HCL 1 G PO TABS: 1000.0000 mg | ORAL_TABLET | Freq: Two times a day (BID) | ORAL | 0 refills | Status: DC

## 2024-07-04 NOTE — Telephone Encounter (Signed)
 Patient called last friday. PCP was supposed to send in a prescription to the pharmacy out of town for her while she was on vacation. Was calling to follow up on getting the medication.

## 2024-07-05 ENCOUNTER — Other Ambulatory Visit: Payer: Self-pay

## 2024-07-05 DIAGNOSIS — R3989 Other symptoms and signs involving the genitourinary system: Secondary | ICD-10-CM

## 2024-07-05 MED ORDER — VALACYCLOVIR HCL 1 G PO TABS: 1000.0000 mg | ORAL_TABLET | Freq: Two times a day (BID) | ORAL | 0 refills | Status: AC

## 2024-07-13 ENCOUNTER — Ambulatory Visit (INDEPENDENT_AMBULATORY_CARE_PROVIDER_SITE_OTHER)

## 2024-07-13 VITALS — BP 116/75 | Ht 63.0 in | Wt 129.2 lb

## 2024-07-13 DIAGNOSIS — Z0000 Encounter for general adult medical examination without abnormal findings: Secondary | ICD-10-CM | POA: Diagnosis not present

## 2024-07-13 NOTE — Progress Notes (Signed)
 Because this visit was a virtual/telehealth visit,  certain criteria was not obtained, such a blood pressure, CBG if applicable, and timed get up and go. Any medications not marked as taking were not mentioned during the medication reconciliation part of the visit. Any vitals not documented were not able to be obtained due to this being a telehealth visit or patient was unable to self-report a recent blood pressure reading due to a lack of equipment at home via telehealth. Vitals that have been documented are verbally provided by the patient.   This visit was performed by a medical professional under my direct supervision. I was immediately available for consultation/collaboration. I have reviewed and agree with the Annual Wellness Visit documentation.  Subjective:   Melissa Grimes is a 68 y.o. who presents for a Medicare Wellness preventive visit.  As a reminder, Annual Wellness Visits don't include a physical exam, and some assessments may be limited, especially if this visit is performed virtually. We may recommend an in-person follow-up visit with your provider if needed.  Visit Complete: Virtual I connected with  Melissa Grimes on 07/13/24 by a audio enabled telemedicine application and verified that I am speaking with the correct person using two identifiers.  Patient Location: Home  Provider Location: Home Office  I discussed the limitations of evaluation and management by telemedicine. The patient expressed understanding and agreed to proceed.  Vital Signs: Because this visit was a virtual/telehealth visit, some criteria may be missing or patient reported. Any vitals not documented were not able to be obtained and vitals that have been documented are patient reported.  VideoDeclined- This patient declined Librarian, academic. Therefore the visit was completed with audio only.  Persons Participating in Visit: Patient.  AWV Questionnaire: Yes: Patient  Medicare AWV questionnaire was completed by the patient on 07/12/2024; I have confirmed that all information answered by patient is correct and no changes since this date.  Cardiac Risk Factors include: advanced age (>9men, >37 women);hypertension     Objective:    Today's Vitals   07/13/24 0854  BP: 116/75  Weight: 129 lb 3.2 oz (58.6 kg)  Height: 5' 3 (1.6 m)   Body mass index is 22.89 kg/m.     07/13/2024    8:50 AM 05/31/2023   10:53 AM 12/29/2022    8:23 AM 12/23/2021    8:26 AM 10/18/2018    5:35 AM 08/14/2013    8:00 PM 08/11/2013    3:00 AM  Advanced Directives  Does Patient Have a Medical Advance Directive? Yes Yes Yes Yes No  Patient has advance directive, copy not in chart  Patient has advance directive, copy not in chart   Type of Advance Directive Healthcare Power of [place];Living will  Healthcare Power of [place];Living will Healthcare Power of [place];Living will  Living will;Healthcare Power of Attorney    Does patient want to make changes to medical advance directive? No - Patient declined  No - Patient declined      Copy of Healthcare Power of Attorney in Chart? No - copy requested  No - copy requested No - copy requested  Copy requested from family  Copy requested from family   Pre-existing out of facility DNR order (yellow form or pink MOST form)      No  No      Data saved with a previous flowsheet row definition    Current Medications (verified) Outpatient Encounter Medications as of 07/13/2024  Medication Sig   aspirin   EC 81 MG tablet Take 81 mg by mouth daily.   atorvastatin  (LIPITOR) 20 MG tablet TAKE 1 TABLET BY MOUTH ONCE  DAILY   emtricitabine -tenofovir  (TRUVADA) 200-300 MG tablet Take 1 tablet by mouth daily.   guaiFENesin -codeine  100-10 MG/5ML syrup Take 5 mLs by mouth 3 (three) times daily as needed for cough.   lisinopril  (ZESTRIL ) 20 MG tablet TAKE 1 TABLET BY MOUTH DAILY   meloxicam  (MOBIC ) 7.5 MG tablet Take 1 tablet (7.5 mg total) by mouth 2  (two) times daily as needed for pain.   Multiple Vitamins-Minerals (CENTRUM SILVER 50+WOMEN PO) Take 1 tablet by mouth daily.   Multiple Vitamins-Minerals (CITRACAL +D3 PO) Take 1 tablet by mouth in the morning and at bedtime. 1200mg  slow release   Multiple Vitamins-Minerals (PRESERVISION AREDS 2) CAPS Take 1 capsule by mouth in the morning and at bedtime.   mupirocin  ointment (BACTROBAN ) 2 % Apply 1 Application topically 2 (two) times daily.   Omega-3 Fatty Acids (FISH OIL ) 1000 MG CAPS Take 1 capsule by mouth daily.   valACYclovir  (VALTREX ) 1000 MG tablet Take 1 tablet (1,000 mg total) by mouth 2 (two) times daily for 10 days. See new directions, thanks   albuterol  (VENTOLIN  HFA) 108 (90 Base) MCG/ACT inhaler Inhale 2 puffs into the lungs every 6 (six) hours as needed for wheezing or shortness of breath.   predniSONE  (DELTASONE ) 10 MG tablet 4 tabs by mouth once daily for 2 days, then 3 tabs daily x 2 days, then 2 tabs daily x 2 days, then 1 tab daily x 2 days (Patient not taking: Reported on 07/13/2024)   No facility-administered encounter medications on file as of 07/13/2024.    Allergies (verified) Achromycin [tetracycline]   History: Past Medical History:  Diagnosis Date   Arthritis    Complication of anesthesia    difficulty urinating after   Hyperlipidemia    Hypertension    Macular degeneration    Osteopenia    Trochanteric bursitis, right hip 10/11/2018   Past Surgical History:  Procedure Laterality Date   BUNIONECTOMY     CESAREAN SECTION     x 2   HERNIA REPAIR Left 11/18/1975   inguinal   OSTEOTOMY  1974 & 1975   toe removal Right 12/08/2021   right second toe   TOTAL HIP ARTHROPLASTY Right 01/18/2013   Procedure: Right TOTAL HIP ARTHROPLASTY ANTERIOR APPROACH;  Surgeon: Lonni CINDERELLA Poli, MD;  Location: [facility] OR;  Service: Orthopedics;  Laterality: Right;   TOTAL HIP ARTHROPLASTY Left 08/09/2013   Procedure: LEFT TOTAL HIP ARTHROPLASTY ANTERIOR APPROACH;   Surgeon: Lonni CINDERELLA Poli, MD;  Location: [facility] OR;  Service: Orthopedics;  Laterality: Left;   Family History  Problem Relation Age of Onset   Arthritis Mother        osteoarthritis   Hyperlipidemia Mother    Heart disease Mother    Hypertension Mother    Hypothyroidism Mother    Hyperlipidemia Father    Heart disease Father    Stroke Father    Parkinson's disease Father    Obesity Sister        overweight   Hypertension Sister    Cancer Maternal Grandfather        lung   Varicose Veins Child    Asthma Child    Migraines Child    Cancer Cousin        breast   Social History   Socioeconomic History   Marital status: Married    Spouse name: Not  on file   Number of children: 2   Years of education: Not on file   Highest education level: Associate degree: occupational, Scientist, product/process development, or vocational program  Occupational History   Not on file  Tobacco Use   Smoking status: Never   Smokeless tobacco: Never  Vaping Use   Vaping status: Never Used  Substance and Sexual Activity   Alcohol use: Yes    Alcohol/week: 3.0 standard drinks of alcohol    Types: 3 Glasses of wine per week    Comment: 1-3   Drug use: No   Sexual activity: Yes    Partners: Male  Other Topics Concern   Not on file  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 [employer]    children- grown both in [place]    Enjoys writing christian ed Scientist, clinical (histocompatibility and immunogenetics)   Social Drivers of Corporate investment banker Strain: Low Risk  (07/12/2024)   Overall Financial Resource Strain (CARDIA)    Difficulty of Paying Living Expenses: Not hard at all  Food Insecurity: No Food Insecurity (07/13/2024)   Hunger Vital Sign    Worried About Running Out of Food in the Last Year: Never true    Ran Out of Food in the Last Year: Never true  Transportation Needs: No Transportation Needs (07/12/2024)   PRAPARE - Administrator, Civil Service (Medical): No    Lack of  Transportation (Non-Medical): No  Physical Activity: Sufficiently Active (07/12/2024)   Exercise Vital Sign    Days of Exercise per Week: 2 days    Minutes of Exercise per Session: 100 min  Stress: No Stress Concern Present (07/12/2024)   [employer] of Occupational Health - Occupational Stress Questionnaire    Feeling of Stress: Only a little  Social Connections: Socially Integrated (07/12/2024)   Social Connection and Isolation Panel    Frequency of Communication with Friends and Family: More than three times a week    Frequency of Social Gatherings with Friends and Family: Twice a week    Attends Religious Services: More than 4 times per year    Active Member of [employer] or Organizations: Yes    Attends Engineer, structural: More than 4 times per year    Marital Status: Married    Tobacco Counseling Counseling given: Not Answered    Clinical Intake:  Pre-visit preparation completed: Yes  Pain : 0-10 Pain Type: Chronic pain Pain Location: Toe (Comment which one) Pain Orientation: Right Pain Radiating Towards: 2nd toe Pain Descriptors / Indicators: Aching Pain Onset: Today Pain Frequency: Constant     BMI - recorded: 22.89 Nutritional Status: BMI of 19-24  Normal Nutritional Risks: None Diabetes: No  Lab Results  Component Value Date   HGBA1C 5.5 04/07/2018   HGBA1C 5.3 08/15/2013     How often do you need to have someone help you when you read instructions, pamphlets, or other written materials from your doctor or pharmacy?: 1 - Never What is the last grade level you completed in school?: college graduate  Interpreter Needed?: No  Information entered by :: Karigan Cloninger,CMA   Activities of Daily Living     07/12/2024   10:13 PM  In your present state of health, do you have any difficulty performing the following activities:  Hearing? 0  Vision? 0  Difficulty concentrating or making decisions? 0  Walking or climbing stairs? 0  Dressing or  bathing? 0  Doing errands, shopping?  0  Preparing Food and eating ? N  Using the Toilet? N  In the past six months, have you accidently leaked urine? N  Do you have problems with loss of bowel control? N  Managing your Medications? N  Managing your Finances? N  Housekeeping or managing your Housekeeping? N    Patient Care Team: O'Sullivan, Melissa, NP as PCP - General (Internal Medicine)  I have updated your Care Teams any recent Medical Services you may have received from other providers in the past year.     Assessment:   This is a routine wellness examination for Melissa Grimes.  Hearing/Vision screen Hearing Screening - Comments:: No difficulties, patient has had her hearing checked recently  Vision Screening - Comments:: Patient has some vision issues but no glasses or contacts. Patient see Dr patel on [address] [place]   Goals Addressed             This Visit's Progress    Patient Stated       Patient would like to get her book published. Patient would like to go read to the blind people. Give some gifts at [employer].       Depression Screen     07/13/2024    9:04 AM 06/02/2023    9:49 AM 02/12/2023    8:06 AM 12/29/2022    8:26 AM 08/22/2022    9:28 AM 02/10/2022    7:32 AM 12/23/2021    8:33 AM  PHQ 2/9 Scores  PHQ - 2 Score 0 0 0 0 0 0 0  PHQ- 9 Score 1 0 0        Fall Risk     07/12/2024   10:13 PM 06/02/2023    9:48 AM 02/12/2023    8:06 AM 12/29/2022    8:23 AM 08/22/2022    9:28 AM  Fall Risk   Falls in the past year? 1 0 0 0 0  Number falls in past yr: 0 0 0 0 0  Injury with Fall? 1 0 0 0 0  Risk for fall due to : History of fall(s);Impaired balance/gait No Fall Risks No Fall Risks No Fall Risks No Fall Risks  Follow up Falls evaluation completed;Falls prevention discussed;Education provided Falls evaluation completed Falls evaluation completed Falls evaluation completed Falls evaluation completed      Data saved with a previous flowsheet row  definition    MEDICARE RISK AT HOME:  Medicare Risk at Home Any stairs in or around the home?: (Patient-Rptd) Yes If so, are there any without handrails?: (Patient-Rptd) No Home free of loose throw rugs in walkways, pet beds, electrical cords, etc?: (Patient-Rptd) Yes Adequate lighting in your home to reduce risk of falls?: (Patient-Rptd) Yes Life alert?: (Patient-Rptd) No Use of a cane, walker or w/c?: (Patient-Rptd) No Grab bars in the bathroom?: (Patient-Rptd) No Shower chair or bench in shower?: (Patient-Rptd) No Elevated toilet seat or a handicapped toilet?: (Patient-Rptd) No  TIMED UP AND GO:  Was the test performed?  No  Cognitive Function: 6CIT completed        07/13/2024    9:07 AM 12/29/2022    8:37 AM  6CIT Screen  What Year? 0 points 0 points  What month? 0 points 0 points  What time? 0 points 0 points  Count back from 20 0 points 0 points  Months in reverse 2 points 0 points  Repeat phrase 0 points 0 points  Total Score 2 points 0 points  Immunizations Immunization History  Administered Date(s) Administered   Fluad Quad(high Dose 65+) 08/07/2020, 08/09/2021, 08/05/2022   Fluad Trivalent(High Dose 65+) 08/27/2023   Hep A / Hep B 02/03/2022, 03/06/2022   Hepatitis B, ADULT 09/26/2015, 10/31/2015   Influenza Split 08/17/2012   Influenza,inj,Quad PF,6+ Mos 08/11/2013, 07/28/2014, 08/06/2015, 09/29/2016, 07/24/2017, 07/23/2018, 07/26/2019   Moderna Covid-19 Fall Seasonal Vaccine 49yrs & older 10/14/2023   Moderna SARS-COV2 Booster Vaccination 12/22/2019   Moderna Sars-Covid-2 Vaccination 09/07/2020, 03/11/2021   PFIZER(Purple Top)SARS-COV-2 Vaccination 12/23/2019, 01/13/2020, 04/01/2022   PNEUMOCOCCAL CONJUGATE-20 08/19/2021   PPD Test 09/26/2015, 10/31/2015, 11/30/2015   Pneumococcal Polysaccharide-23 08/12/2013, 02/14/2020   Respiratory Syncytial Virus Vaccine,Recomb Aduvanted(Arexvy) 08/18/2022   Tdap 12/22/2012, 12/04/2021   Zoster  Recombinant(Shingrix) 05/16/2018, 07/23/2018, 07/26/2019   Zoster, Live 12/14/2014    Screening Tests Health Maintenance  Topic Date Due   COVID-19 Vaccine (7 - 2024-25 season) 04/12/2024   INFLUENZA VACCINE  06/17/2024   Medicare Annual Wellness (AWV)  07/13/2025   MAMMOGRAM  01/18/2026   Fecal DNA (Cologuard)  10/15/2026   DTaP/Tdap/Td (3 - Td or Tdap) 12/05/2031   Pneumococcal Vaccine: 50+ Years  Completed   DEXA SCAN  Completed   Hepatitis C Screening  Completed   Zoster Vaccines- Shingrix  Completed   HPV VACCINES  Aged Out   Meningococcal B Vaccine  Aged Out   Hepatitis B Vaccines 19-59 Average Risk  Discontinued   Colonoscopy  Discontinued    Health Maintenance  Health Maintenance Due  Topic Date Due   COVID-19 Vaccine (7 - 2024-25 season) 04/12/2024   INFLUENZA VACCINE  06/17/2024   Health Maintenance Items Addressed:patient declined vaccinations  Additional Screening:  Vision Screening: Recommended annual ophthalmology exams for early detection of glaucoma and other disorders of the eye. Would you like a referral to an eye doctor? No    Dental Screening: Recommended annual dental exams for proper oral hygiene  Community Resource Referral / Chronic Care Management: CRR required this visit?  No   CCM required this visit?  No   Plan:    I have personally reviewed and noted the following in the patient's chart:   Medical and social history Use of alcohol, tobacco or illicit drugs  Current medications and supplements including opioid prescriptions. Patient is not currently taking opioid prescriptions. Functional ability and status Nutritional status Physical activity Advanced directives List of other physicians Hospitalizations, surgeries, and ER visits in previous 12 months Vitals Screenings to include cognitive, depression, and falls Referrals and appointments  In addition, I have reviewed and discussed with patient certain preventive protocols,  quality metrics, and best practice recommendations. A written personalized care plan for preventive services as well as general preventive health recommendations were provided to patient.   Lyle MARLA Right, [place]   07/13/2024   After Visit Summary: (MyChart) Due to this being a telephonic visit, the after visit summary with patients personalized plan was offered to patient via MyChart   Notes: Nothing significant to report at this time.

## 2024-07-13 NOTE — Patient Instructions (Signed)
 Ms. Richwine , Thank you for taking time out of your busy schedule to complete your Annual Wellness Visit with me. I enjoyed our conversation and look forward to speaking with you again next year. I, as well as your care team,  appreciate your ongoing commitment to your health goals. Please review the following plan we discussed and let me know if I can assist you in the future. Your Game plan/ To Do List    Referrals: If you haven't heard from the office you've been referred to, please reach out to them at the phone provided.   Follow up Visits: We will see or speak with you next year for your Next Medicare AWV with our clinical staff Have you seen your provider in the last 6 months (3 months if uncontrolled diabetes)? Yes  Clinician Recommendations:  Aim for 30 minutes of exercise or brisk walking, 6-8 glasses of water, and 5 servings of fruits and vegetables each day.       This is a list of the screenings recommended for you:  Health Maintenance  Topic Date Due   COVID-19 Vaccine (7 - 2024-25 season) 04/12/2024   Flu Shot  06/17/2024   Medicare Annual Wellness Visit  07/13/2025   Mammogram  01/18/2026   Cologuard (Stool DNA test)  10/15/2026   DTaP/Tdap/Td vaccine (3 - Td or Tdap) 12/05/2031   Pneumococcal Vaccine for age over 20  Completed   DEXA scan (bone density measurement)  Completed   Hepatitis C Screening  Completed   Zoster (Shingles) Vaccine  Completed   HPV Vaccine  Aged Out   Meningitis B Vaccine  Aged Out   Hepatitis B Vaccine  Discontinued   Colon Cancer Screening  Discontinued    Advanced directives: (Copy Requested) Please bring a copy of your health care power of attorney and living will to the office to be added to your chart at your convenience. You can mail to [facility] [address] [place], [place] [address] or email to [email or web address] Advance Care Planning is important because it:  [x]  Makes sure you receive the medical  care that is consistent with your values, goals, and preferences  [x]  It provides guidance to your family and loved ones and reduces their decisional burden about whether or not they are making the right decisions based on your wishes.  Follow the link provided in your after visit summary or read over the paperwork we have mailed to you to help you started getting your Advance Directives in place. If you need assistance in completing these, please reach out to us  so that we can help you!  See attachments for Preventive Care and Fall Prevention Tips.

## 2024-07-30 ENCOUNTER — Other Ambulatory Visit: Payer: Self-pay | Admitting: Family

## 2024-07-30 DIAGNOSIS — Z7251 High risk heterosexual behavior: Secondary | ICD-10-CM

## 2024-07-31 NOTE — Telephone Encounter (Signed)
Please call pt to schedule follow up visit. 

## 2024-08-15 ENCOUNTER — Ambulatory Visit (INDEPENDENT_AMBULATORY_CARE_PROVIDER_SITE_OTHER)

## 2024-08-15 ENCOUNTER — Ambulatory Visit (INDEPENDENT_AMBULATORY_CARE_PROVIDER_SITE_OTHER): Admitting: Podiatry

## 2024-08-15 DIAGNOSIS — M7752 Other enthesopathy of left foot: Secondary | ICD-10-CM

## 2024-08-15 DIAGNOSIS — M21619 Bunion of unspecified foot: Secondary | ICD-10-CM

## 2024-08-15 MED ORDER — METHYLPREDNISOLONE 4 MG PO TBPK: ORAL_TABLET | ORAL | 0 refills | Status: DC

## 2024-08-15 MED ORDER — CEPHALEXIN 500 MG PO CAPS: 500.0000 mg | ORAL_CAPSULE | Freq: Three times a day (TID) | ORAL | 0 refills | Status: DC

## 2024-08-15 NOTE — Progress Notes (Unsigned)
 Topical cream

## 2024-08-16 ENCOUNTER — Other Ambulatory Visit (HOSPITAL_COMMUNITY)
Admission: RE | Admit: 2024-08-16 | Discharge: 2024-08-16 | Disposition: A | Discharge status: Home / Self Care | Source: Ambulatory Visit | Attending: Family | Admitting: Family

## 2024-08-16 ENCOUNTER — Ambulatory Visit: Admitting: Family

## 2024-08-16 VITALS — BP 115/76 | HR 72 | Temp 98.7°F | Resp 16 | Ht 63.0 in | Wt 134.0 lb

## 2024-08-16 DIAGNOSIS — M2042 Other hammer toe(s) (acquired), left foot: Secondary | ICD-10-CM | POA: Diagnosis not present

## 2024-08-16 DIAGNOSIS — Z7251 High risk heterosexual behavior: Secondary | ICD-10-CM | POA: Diagnosis not present

## 2024-08-16 DIAGNOSIS — Z113 Encounter for screening for infections with a predominantly sexual mode of transmission: Secondary | ICD-10-CM | POA: Insufficient documentation

## 2024-08-16 DIAGNOSIS — M21611 Bunion of right foot: Secondary | ICD-10-CM

## 2024-08-16 DIAGNOSIS — I10 Essential (primary) hypertension: Secondary | ICD-10-CM

## 2024-08-16 DIAGNOSIS — R310 Gross hematuria: Secondary | ICD-10-CM

## 2024-08-16 DIAGNOSIS — M2012 Hallux valgus (acquired), left foot: Secondary | ICD-10-CM | POA: Diagnosis not present

## 2024-08-16 NOTE — Progress Notes (Unsigned)
 Subjective:     Patient ID: Melissa Grimes, female    DOB: 03/13/1956, 68 y.o.   MRN: [id number]  Chief Complaint  Patient presents with   Hypertension    Here for follow up    Hypertension    Discussed the use of AI scribe software for clinical note transcription with the patient, who gave verbal consent to proceed.  History of Present Illness Melissa Grimes is a 68 year old female who presents for follow up.  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   She experiences persistent bunion pain with discomfort in the bursa and nerve, despite using orthotics. She started an antibiotic this morning. She has consulted with a foot and ankle specialist in [place] and a podiatrist in [facility], who previously removed her second toe on the affected foot. X-rays have been taken.   She had a recent vaginal ulcer that feels like an abrasion but was not painful. She was treated with valtrex  and lesion resolved.        Health Maintenance Due  Topic Date Due   Influenza Vaccine  06/17/2024   COVID-19 Vaccine (7 - 2024-25 season) 07/18/2024    Past Medical History:  Diagnosis Date   Arthritis    Complication of anesthesia    difficulty urinating after   Hyperlipidemia    Hypertension    Macular degeneration    Osteopenia    Trochanteric bursitis, right hip 10/11/2018    Past Surgical History:  Procedure Laterality Date   BUNIONECTOMY     CESAREAN SECTION     x 2   HERNIA REPAIR Left 11/18/1975   inguinal   OSTEOTOMY  1974 & 1975   toe removal Right 12/08/2021   right second toe   TOTAL HIP ARTHROPLASTY Right 01/18/2013   Procedure: Right TOTAL HIP ARTHROPLASTY ANTERIOR APPROACH;  Surgeon: Lonni CINDERELLA Poli, MD;  Location: [facility] OR;  Service: Orthopedics;  Laterality: Right;   TOTAL HIP ARTHROPLASTY Left 08/09/2013   Procedure: LEFT TOTAL HIP ARTHROPLASTY ANTERIOR APPROACH;  Surgeon: Lonni CINDERELLA Poli, MD;  Location: [facility] OR;   Service: Orthopedics;  Laterality: Left;    Family History  Problem Relation Age of Onset   Arthritis Mother        osteoarthritis   Hyperlipidemia Mother    Heart disease Mother    Hypertension Mother    Hypothyroidism Mother    Hyperlipidemia Father    Heart disease Father    Stroke Father    Parkinson's disease Father    Obesity Sister        overweight   Hypertension Sister    Cancer Maternal Grandfather        lung   Varicose Veins Child    Asthma Child    Migraines Child    Cancer Cousin        breast    Social History   Socioeconomic History   Marital status: Married    Spouse name: Not on file   Number of children: 2   Years of education: Not on file   Highest education level: Associate degree: occupational, Scientist, product/process development, or vocational program  Occupational History   Not on file  Tobacco Use   Smoking status: Never   Smokeless tobacco: Never  Vaping Use   Vaping status: Never Used  Substance and Sexual Activity   Alcohol use: Yes    Alcohol/week: 3.0 standard drinks of alcohol    Types: 3 Glasses of  wine per week    Comment: 1-3   Drug use: No   Sexual activity: Yes    Partners: Male  Other Topics Concern   Not on file  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 [employer]    children- grown both in [place]    Enjoys writing christian ed Scientist, clinical (histocompatibility and immunogenetics)   Social Drivers of Corporate investment banker Strain: Low Risk  (07/12/2024)   Overall Financial Resource Strain (CARDIA)    Difficulty of Paying Living Expenses: Not hard at all  Food Insecurity: No Food Insecurity (07/13/2024)   Hunger Vital Sign    Worried About Running Out of Food in the Last Year: Never true    Ran Out of Food in the Last Year: Never true  Transportation Needs: No Transportation Needs (07/12/2024)   PRAPARE - Administrator, Civil Service (Medical): No    Lack of Transportation (Non-Medical): No  Physical Activity:  Sufficiently Active (07/12/2024)   Exercise Vital Sign    Days of Exercise per Week: 2 days    Minutes of Exercise per Session: 100 min  Stress: No Stress Concern Present (07/12/2024)   [employer] of Occupational Health - Occupational Stress Questionnaire    Feeling of Stress: Only a little  Social Connections: Socially Integrated (07/12/2024)   Social Connection and Isolation Panel    Frequency of Communication with Friends and Family: More than three times a week    Frequency of Social Gatherings with Friends and Family: Twice a week    Attends Religious Services: More than 4 times per year    Active Member of [employer] or Organizations: Yes    Attends Engineer, structural: More than 4 times per year    Marital Status: Married  Catering manager Violence: Not At Risk (07/13/2024)   Humiliation, Afraid, Rape, and Kick questionnaire    Fear of Current or Ex-Partner: No    Emotionally Abused: No    Physically Abused: No    Sexually Abused: No    Outpatient Medications Prior to Visit  Medication Sig Dispense Refill   aspirin  EC 81 MG tablet Take 81 mg by mouth daily.     atorvastatin  (LIPITOR) 20 MG tablet TAKE 1 TABLET BY MOUTH ONCE  DAILY 100 tablet 1   cephALEXin  (KEFLEX ) 500 MG capsule Take 1 capsule (500 mg total) by mouth 3 (three) times daily. 21 capsule 0   lisinopril  (ZESTRIL ) 20 MG tablet TAKE 1 TABLET BY MOUTH DAILY 100 tablet 2   meloxicam  (MOBIC ) 7.5 MG tablet Take 1 tablet (7.5 mg total) by mouth 2 (two) times daily as needed for pain. 30 tablet 2   methylPREDNISolone  (MEDROL  DOSEPAK) 4 MG TBPK tablet Take as directed 21 tablet 0   Multiple Vitamins-Minerals (CENTRUM SILVER 50+WOMEN PO) Take 1 tablet by mouth daily.     Multiple Vitamins-Minerals (CITRACAL +D3 PO) Take 1 tablet by mouth in the morning and at bedtime. 1200mg  slow release     Multiple Vitamins-Minerals (PRESERVISION AREDS 2) CAPS Take 1 capsule by mouth in the morning and at bedtime.     mupirocin   ointment (BACTROBAN ) 2 % Apply 1 Application topically 2 (two) times daily. 22 g 0   Omega-3 Fatty Acids (FISH OIL ) 1000 MG CAPS Take 1 capsule by mouth daily.     emtricitabine -tenofovir  (TRUVADA) 200-300 MG tablet TAKE 1 TABLET BY MOUTH EVERY DAY 14 tablet 0   albuterol  (VENTOLIN  HFA)  108 (90 Base) MCG/ACT inhaler Inhale 2 puffs into the lungs every 6 (six) hours as needed for wheezing or shortness of breath. 8 g 0   guaiFENesin -codeine  100-10 MG/5ML syrup Take 5 mLs by mouth 3 (three) times daily as needed for cough. 75 mL 0   predniSONE  (DELTASONE ) 10 MG tablet 4 tabs by mouth once daily for 2 days, then 3 tabs daily x 2 days, then 2 tabs daily x 2 days, then 1 tab daily x 2 days (Patient not taking: Reported on 07/13/2024) 20 tablet 0   No facility-administered medications prior to visit.    Allergies  Allergen Reactions   Achromycin [Tetracycline] Hives    ROS    See HPI Objective:    Physical Exam Constitutional:      General: She is not in acute distress.    Appearance: Normal appearance. She is well-developed.  HENT:     Head: Normocephalic and atraumatic.     Right Ear: External ear normal.     Left Ear: External ear normal.  Eyes:     General: No scleral icterus. Neck:     Thyroid : No thyromegaly.  Cardiovascular:     Rate and Rhythm: Normal rate and regular rhythm.     Heart sounds: Normal heart sounds. No murmur heard. Pulmonary:     Effort: Pulmonary effort is normal. No respiratory distress.     Breath sounds: Normal breath sounds. No wheezing.  Musculoskeletal:     Cervical back: Neck supple.  Skin:    General: Skin is warm and dry.  Neurological:     Mental Status: She is alert and oriented to person, place, and time.  Psychiatric:        Mood and Affect: Mood normal.        Behavior: Behavior normal.        Thought Content: Thought content normal.        Judgment: Judgment normal.       BP 115/76 (BP Location: Right Arm, Patient Position:  Sitting, Cuff Size: Normal)   Pulse 72   Temp 98.7 F (37.1 C) (Oral)   Resp 16   Ht 5' 3 (1.6 m)   Wt 134 lb (60.8 kg)   LMP 11/17/2010   SpO2 99%   BMI 23.74 kg/m  Wt Readings from Last 3 Encounters:  08/16/24 134 lb (60.8 kg)  07/13/24 129 lb 3.2 oz (58.6 kg)  05/06/24 131 lb (59.4 kg)       Assessment & Plan:   Problem List Items Addressed This Visit       Unprioritized   HTN (hypertension)   BP Readings from Last 3 Encounters:  08/16/24 115/76  07/13/24 116/75  05/06/24 116/75   BP stable on lisinopril , continue same.       High risk sexual behavior   Maintained on Truvada for HIV Prep. Update HIV and refill Truvada for 3 months.       Relevant Medications   emtricitabine -tenofovir  (TRUVADA) 200-300 MG tablet   Gross hematuria   Pt never returned to repeat UA after UTI treatment last year. Will have her return to lab for UA.  If persistent hematuria, plan referral to Urology.       Bunion, right foot    Chronic bunion with pain and arthritis, inflamed and painful. - Uses orthotics - Plan surgical intervention for bunion, calcification, arthritis, possible pin insertion per podiatry.      Other Visit Diagnoses       Screen  for STD (sexually transmitted disease)    -  Primary   Relevant Orders   HIV antibody (with reflex) (Completed)   HepB+HepC+HIV Panel (Completed)   HSV(herpes simplex vrs) 1+2 ab-IgG   RPR   Cervicovaginal ancillary only( [place])     Repeat HSV due to recent vaginal ulcer.   I have discontinued Zane E. Brigante Joy's albuterol , predniSONE , and guaiFENesin -codeine . I have also changed her emtricitabine -tenofovir . Additionally, I am having her maintain her aspirin  EC, Fish Oil , meloxicam , atorvastatin , PreserVision AREDS 2, Multiple Vitamins-Minerals (CITRACAL +D3 PO), Multiple Vitamins-Minerals (CENTRUM SILVER 50+WOMEN PO), mupirocin  ointment, lisinopril , methylPREDNISolone , and cephALEXin .  Meds ordered this encounter   Medications   emtricitabine -tenofovir  (TRUVADA) 200-300 MG tablet    Sig: Take 1 tablet by mouth daily.    Dispense:  90 tablet    Refill:  0    Needs OV prior to additional refills.    Supervising Provider:   DOMENICA BLACKBIRD A [phone]

## 2024-08-16 NOTE — Patient Instructions (Signed)
Look for an extra depth or double depth shoe

## 2024-08-17 ENCOUNTER — Telehealth: Payer: Self-pay | Admitting: Podiatry

## 2024-08-17 ENCOUNTER — Telehealth: Payer: Self-pay | Admitting: Family

## 2024-08-17 DIAGNOSIS — M21612 Bunion of left foot: Secondary | ICD-10-CM | POA: Insufficient documentation

## 2024-08-17 DIAGNOSIS — R319 Hematuria, unspecified: Secondary | ICD-10-CM

## 2024-08-17 LAB — HEPB+HEPC+HIV PANEL
HIV Screen 4th Generation wRfx: NONREACTIVE
Hep B C IgM: NEGATIVE
Hep B Core Total Ab: NEGATIVE
Hep B E Ab: NONREACTIVE
Hep B E Ag: NEGATIVE
Hep B Surface Ab, Qual: REACTIVE
Hep C Virus Ab: NONREACTIVE
Hepatitis B Surface Ag: NEGATIVE

## 2024-08-17 LAB — HIV ANTIBODY (ROUTINE TESTING W REFLEX)
HIV 1&2 Ab, 4th Generation: NONREACTIVE
HIV FINAL INTERPRETATION: NEGATIVE

## 2024-08-17 LAB — HERPES SIMPLEX VIRUS 1 AND 2 (IGG),REFLEX HSV-2 INHIBITION
HSV 1 IGG,TYPE SPECIFIC AB: 24 {index} — ABNORMAL HIGH
HSV 2 IGG,TYPE SPECIFIC AB: 14.7 {index} — ABNORMAL HIGH

## 2024-08-17 LAB — RPR: RPR Ser Ql: NONREACTIVE

## 2024-08-17 MED ORDER — EMTRICITABINE-TENOFOVIR DF 200-300 MG PO TABS: 1.0000 | ORAL_TABLET | Freq: Every day | ORAL | 0 refills | Status: DC

## 2024-08-17 NOTE — Telephone Encounter (Signed)
 Patient states she needs a 2 week follow up appointment around 08/30/24 but your next opening id 11/3 Patient mentioned she is taking pain medications and would like to proceed with procedure as soon as possible around 09/16/24.

## 2024-08-17 NOTE — Assessment & Plan Note (Signed)
 Pt never returned to repeat UA after UTI treatment last year. Will have her return to lab for UA.  If persistent hematuria, plan referral to Urology.

## 2024-08-17 NOTE — Telephone Encounter (Signed)
 Called patient but no answer, left voice mail for patient to call back.  She needs lab appointment for urine check

## 2024-08-17 NOTE — Assessment & Plan Note (Signed)
 Maintained on Truvada for HIV Prep. Update HIV and refill Truvada for 3 months.

## 2024-08-17 NOTE — Assessment & Plan Note (Addendum)
  Chronic bunion with pain and arthritis, inflamed and painful. - Uses orthotics - Plan surgical intervention for bunion, calcification, arthritis, possible pin insertion per podiatry.

## 2024-08-17 NOTE — Assessment & Plan Note (Signed)
 BP Readings from Last 3 Encounters:  08/16/24 115/76  07/13/24 116/75  05/06/24 116/75   BP stable on lisinopril , continue same.

## 2024-08-17 NOTE — Telephone Encounter (Signed)
 I was looking back at her chart and it looks like she never returned to repeat her urine to make sure blood was resolved. Please place lab order.

## 2024-08-17 NOTE — Patient Instructions (Signed)
 VISIT SUMMARY:  Today, you were seen for worsening bunion pain and a vaginal ulcer. We discussed your current treatments and planned further steps to address these issues.  YOUR PLAN:  BUNION WITH ASSOCIATED PAIN AND ARTHRITIS, RIGHT FOOT: You have a chronic bunion with pain and arthritis in your right foot, which is inflamed and painful. -Continue using orthotics for support. -Consult with your foot and ankle specialist and podiatrist. -We are planning a surgical intervention for your bunion, which may include addressing calcification, arthritis, and possibly inserting a pin. -We will proceed with surgery once the inflammation has reduced, potentially in two weeks.  VAGINAL ULCER, ETIOLOGY UNDETERMINED: You have a non-painful vaginal ulcer of undetermined cause, which may be due to an infection. -We will perform STD screening, including tests for herpes and HIV. -A vaginal swab will be taken for further analysis.  PRE-EXPOSURE PROPHYLAXIS FOR HIV (TRUVADA): You are continuing Truvada for HIV prevention. -Your Truvada prescription will be refilled.

## 2024-08-18 ENCOUNTER — Ambulatory Visit: Payer: Self-pay | Admitting: Family

## 2024-08-18 DIAGNOSIS — B009 Herpesviral infection, unspecified: Secondary | ICD-10-CM

## 2024-08-18 DIAGNOSIS — Z7251 High risk heterosexual behavior: Secondary | ICD-10-CM

## 2024-08-18 LAB — CERVICOVAGINAL ANCILLARY ONLY
Chlamydia: NEGATIVE
Comment: NEGATIVE
Comment: NEGATIVE
Comment: NORMAL
Neisseria Gonorrhea: NEGATIVE
Trichomonas: NEGATIVE

## 2024-08-18 NOTE — Telephone Encounter (Signed)
 Patient called and scheduled for tomorrow afternoon.

## 2024-08-19 ENCOUNTER — Telehealth: Payer: Self-pay | Admitting: Podiatry

## 2024-08-19 ENCOUNTER — Other Ambulatory Visit

## 2024-08-19 DIAGNOSIS — R319 Hematuria, unspecified: Secondary | ICD-10-CM

## 2024-08-19 NOTE — Telephone Encounter (Signed)
 Called and left a voicemail for a return call. Per Dr.Wagoner she can be scheduled for in office procedure on 10/14 at 11:15

## 2024-08-19 NOTE — Addendum Note (Signed)
 Addended by: ROSEBUD NEST A on: 08/19/2024 03:01 PM   Modules accepted: Orders

## 2024-08-19 NOTE — Telephone Encounter (Signed)
 Pt called to let you know the antibiotics is working and  Pt has an appointment  on Nov.3

## 2024-08-20 LAB — URINALYSIS, ROUTINE W REFLEX MICROSCOPIC
Bilirubin Urine: NEGATIVE
Glucose, UA: NEGATIVE
Hgb urine dipstick: NEGATIVE
Ketones, ur: NEGATIVE
Leukocytes,Ua: NEGATIVE
Nitrite: NEGATIVE
Protein, ur: NEGATIVE
Specific Gravity, Urine: 1.02 (ref 1.001–1.035)
pH: 7.5 (ref 5.0–8.0)

## 2024-08-22 MED ORDER — VALACYCLOVIR HCL 500 MG PO TABS: 500.0000 mg | ORAL_TABLET | Freq: Every day | ORAL | 1 refills | Status: AC

## 2024-08-22 MED ORDER — EMTRICITABINE-TENOFOVIR DF 200-300 MG PO TABS: 1.0000 | ORAL_TABLET | Freq: Every day | ORAL | 0 refills | Status: DC

## 2024-08-22 NOTE — Addendum Note (Signed)
 Addended by: DARYL SETTER on: 08/22/2024 04:53 PM   Modules accepted: Orders

## 2024-08-30 ENCOUNTER — Ambulatory Visit: Payer: Self-pay

## 2024-08-30 ENCOUNTER — Encounter: Payer: Self-pay | Admitting: Family

## 2024-08-30 NOTE — Telephone Encounter (Signed)
 FYI Only or Action Required?: Action required by provider: clinical question for provider.  Patient was last seen in primary care on 08/16/2024 by Daryl Setter, NP.  Called Nurse Triage reporting Hematuria.  Symptoms began yesterday.  Interventions attempted: Rest, hydration, or home remedies.  Symptoms are: gradually improving.  Triage Disposition: See Physician Within 24 Hours  Patient/caregiver understands and will follow disposition?: Yes    Copied from CRM #[id number]. Topic: Clinical - Red Word Triage >> Aug 30, 2024  3:01 PM Nessti S wrote: [facility] that prompted transfer to Nurse Triage: blood in urine Reason for Disposition  Blood in urine  (Exception: Could be normal menstrual bleeding.)  Answer Assessment - Initial Assessment Questions Additional info; 1) She has had 4 episodes of hematuria since 2020.  2) Scheduled acute visit with an alternate provider on 08/31/24. She is requesting an order for urinalysis today to be able to provide sample and have results for tomorrows appointment. She is requesting MyChart text message or call back today to let her know if order is written to provide sample today. Please advise.   1. COLOR of URINE: Describe the color of the urine.  (e.g., tea-colored, pink, red, bloody) Do you have blood clots in your urine? (e.g., none, pea, grape, small coin)     Was red bloody, she did pass a small stone overnight that she saved. Had clots which resolved, now urine is cloudy.  2. ONSET: When did the bleeding start?      08/29/24 3. EPISODES: How many times has there been blood in the urine? or How many times today?     Each-resolved now.  4. PAIN with URINATION: Is there any pain with passing your urine? If Yes, ask: How bad is the pain?  (Scale 1-10; or mild, moderate, severe)     730-midnight had pain 5. FEVER: Do you have a fever? If Yes, ask: What is your temperature, how was it measured, and when did it start?      Denies  6. ASSOCIATED SYMPTOMS: Are you passing urine more frequently than usual?     More frequent.  7. OTHER SYMPTOMS: Do you have any other symptoms? (e.g., back/flank pain, abdomen pain, vomiting)     Feels she passed a stone 8. PREGNANCY: Is there any chance you are pregnant? When was your last menstrual period?  Protocols used: Urine - Blood In-A-AH

## 2024-08-31 ENCOUNTER — Ambulatory Visit: Admitting: Family Medicine

## 2024-09-02 ENCOUNTER — Ambulatory Visit: Payer: Self-pay

## 2024-09-02 NOTE — Telephone Encounter (Signed)
 Patient reports episode of blood in urine after passing crystals. Patient states bleeding went away and she felt better. Reports nausea and urinary urgency since last night. Patient reports she is going out of town and wants to give a urine sample in the office. Patient is asking for a call back from the office.  FYI Only or Action Required?: Action required by provider: clinical question for provider.  Patient was last seen in primary care on 08/16/2024 by Daryl Setter, NP.  Called Nurse Triage reporting Urinary Urgency.  Symptoms began yesterday.  Interventions attempted: OTC medications: Tylenol , AZO and Rest, hydration, or home remedies.  Symptoms are: unchanged.  Triage Disposition: See Physician Within 24 Hours  Patient/caregiver understands and will follow disposition?: No, wishes to speak with PCP  Copied from CRM #[id number]. Topic: Clinical - Red Word Triage >> Sep 02, 2024  8:38 AM Anairis L wrote: Red Word that prompted transfer to Nurse Triage: Blood in urine/Clots, possible UTI. Reason for Disposition  Urinating more frequently than usual (i.e., frequency) OR new-onset of the feeling of an urgent need to urinate (i.e., urgency)  Answer Assessment - Initial Assessment Questions 1. SYMPTOM: What's the main symptom you're concerned about? (e.g., frequency, incontinence)     Urinary urgency, reports blood in urine on Monday but that passed by Wednesday 2. ONSET: When did the  urinary urgency  start?     Started yesterday 3. PAIN: Is there any pain? If Yes, ask: How bad is it? (Scale: 1-10; mild, moderate, severe)     mild 4. CAUSE: What do you think is causing the symptoms?     Patient is concerned for UTI.  5. OTHER SYMPTOMS: Do you have any other symptoms? (e.g., blood in urine, fever, flank pain, pain with urination)     nausea  Protocols used: Urinary Symptoms-A-AH

## 2024-09-19 ENCOUNTER — Ambulatory Visit: Admitting: Podiatry

## 2024-09-19 ENCOUNTER — Encounter: Payer: Self-pay | Admitting: Podiatry

## 2024-09-19 DIAGNOSIS — M2042 Other hammer toe(s) (acquired), left foot: Secondary | ICD-10-CM | POA: Diagnosis not present

## 2024-09-19 DIAGNOSIS — M19072 Primary osteoarthritis, left ankle and foot: Secondary | ICD-10-CM | POA: Diagnosis not present

## 2024-09-19 DIAGNOSIS — M2041 Other hammer toe(s) (acquired), right foot: Secondary | ICD-10-CM | POA: Diagnosis not present

## 2024-09-19 DIAGNOSIS — M21619 Bunion of unspecified foot: Secondary | ICD-10-CM | POA: Diagnosis not present

## 2024-09-19 NOTE — Progress Notes (Unsigned)
 Subjective:   Patient ID: Melissa Grimes, female   DOB: 68 y.o.   MRN: [id number]   HPI Chief Complaint  Patient presents with   Bunions    Rm12 patient f/u on bunion rleft foot/ pt says she is 99% improved and can wear socks and shoes with no problems.    The patient presents with a history of multiple foot surgeries since the age of 60, including the removal of the right second toe in January 2021 due to deformity. They report ongoing issues with their feet, including bunions, hammertoes, and a left second toe that is beginning to curl upwards.  She states that from a flexion standpoint she is doing much better she wants to have the toes corrected to help with any further issues.  She states that the toes to her particular shoes and pressure.  She does not report any open lesions or any areas of drainage.  No fevers or chills.     Objective:  Physical Exam  General: AAO x3, NAD  Dermatological: Skin is warm, dry and supple bilateral. There are no open sores, no preulcerative lesions, no rash or signs of infection present.  Vascular: Dorsalis Pedis artery and Posterior Tibial artery pedal pulses are 2/4 bilateral with immedate capillary fill time. There is no pain with calf compression, swelling, warmth, erythema.   Neruologic: Grossly intact via light touch bilateral.   Musculoskeletal: Significant bunion is present.  There is severe bunions present bilaterally and hammertoes noted.  On the left foot hammertoe contractures noted to the second, third toes.  The right second toe has been amputated as it previously was overlapping the hallux.    No pain with lateral compression of calcaneus.  MMT 5/5.  Right leg is about half centimeter shorter than the left.       Assessment:   68 year old female with symptomatic bunion, hammertoe deformities left foot     Plan:  Bunion and Hammertoe Deformities Chronic foot deformities with history of multiple surgeries. Right second toe  amputated in January 2021. Left second toe currently causing discomfort due to upward curling. No recent injuries. -We discussed the conservative as well as surgical options.  This time she is interested in surgical intervention.  She does get some ongoing erythema and she has had a previous wound on the area of bunion.  It is amenable to an MRI to further evaluate for any infection prior to surgery. -Discussed with her first MTPJ arthrodesis as well as hammertoe repair and second metatarsal shortening osteotomy.  After discussion regards options she wishes to proceed.  We discussed long-term limitations of having a big toe fusion. -The incision placement as well as the postoperative course was discussed with the patient. I discussed risks of the surgery which include, but not limited to, infection, bleeding, pain, swelling, need for further surgery, delayed or nonhealing, painful or ugly scar, numbness or sensation changes, over/under correction, recurrence, transfer lesions, further deformity, hardware failure, DVT/PE, loss of toe/foot. Patient understands these risks and wishes to proceed with surgery. The surgical consent was reviewed with the patient all 3 pages were signed. No promises or guarantees were given to the outcome of the procedure. All questions were answered to the best of my ability. Before the surgery the patient was encouraged to call the office if there is any further questions. The surgery will be performed at the [facility] on an outpatient basis. -Will get medical clearance  No follow-ups on file.  Donnice JONELLE Fees DPM

## 2024-09-19 NOTE — Patient Instructions (Signed)

## 2024-09-22 ENCOUNTER — Ambulatory Visit
Admission: RE | Admit: 2024-09-22 | Discharge: 2024-09-22 | Disposition: A | Discharge status: Home / Self Care | Source: Ambulatory Visit | Attending: Podiatry | Admitting: Podiatry

## 2024-09-22 DIAGNOSIS — M19072 Primary osteoarthritis, left ankle and foot: Secondary | ICD-10-CM

## 2024-09-26 ENCOUNTER — Ambulatory Visit: Payer: Self-pay | Admitting: Podiatry

## 2024-09-29 ENCOUNTER — Telehealth: Payer: Self-pay | Admitting: Podiatry

## 2024-09-29 NOTE — Telephone Encounter (Signed)
 DOS- 10/05/2024 (WILL BE DONE AT [facility])  METATARSAL OSTEOTOMY 2-5 2ND LT- 28308 HAMMERTOE REPAIR 2ND + 3RD LT- 28285 HALLUX MPJ FUSION LT- 28750  [facility] EFFECTIVE DATE- 11/18/2023  DEDUCTIBLE- $0 REMAINING- $0 OOP- $3900 REMAINING- $3785 COINSURANCE- 0%  PER [employer] PORTAL, NO PRIOR AUTHS ARE REQUIRED FOR CPT CODES 71691, 71714 (2 UNITS), AND 71249. DECISION ID# [id number]

## 2024-09-29 NOTE — Telephone Encounter (Signed)
 Patient is scheduled for surgery on 10/05/2024. Patient is aware to stop any blood thinners (baby aspirin ) from now until surgery. Patient will be off it exactly 7 days prior to surgery. Not on any GLP1 medications. Patient pharmacy is set as preferred in chart.

## 2024-09-29 NOTE — Telephone Encounter (Signed)
 Has she been scheduled for surgery yet? Thanks!

## 2024-09-29 NOTE — Telephone Encounter (Signed)
 She's been put on for this coming Wednesday 10/05/2024

## 2024-10-03 NOTE — Telephone Encounter (Signed)
 Patient contacted office in regards to time of surgery. Advised patient if she had not heard anything by the end of day today to contact surgery center. Also had question in regards to post op's- thought there would be only one I explained we typically do 3 with her provider.

## 2024-10-05 ENCOUNTER — Other Ambulatory Visit: Payer: Self-pay | Admitting: Podiatry

## 2024-10-05 DIAGNOSIS — M2022 Hallux rigidus, left foot: Secondary | ICD-10-CM | POA: Diagnosis not present

## 2024-10-05 DIAGNOSIS — M2042 Other hammer toe(s) (acquired), left foot: Secondary | ICD-10-CM | POA: Diagnosis not present

## 2024-10-05 DIAGNOSIS — M21542 Acquired clubfoot, left foot: Secondary | ICD-10-CM | POA: Diagnosis not present

## 2024-10-05 MED ORDER — OXYCODONE-ACETAMINOPHEN 5-325 MG PO TABS: 1.0000 | ORAL_TABLET | Freq: Four times a day (QID) | ORAL | 0 refills | Status: DC | PRN

## 2024-10-05 MED ORDER — PROMETHAZINE HCL 25 MG PO TABS: 25.0000 mg | ORAL_TABLET | Freq: Three times a day (TID) | ORAL | 0 refills | Status: DC | PRN

## 2024-10-05 MED ORDER — CEPHALEXIN 500 MG PO CAPS: 500.0000 mg | ORAL_CAPSULE | Freq: Three times a day (TID) | ORAL | 0 refills | Status: DC

## 2024-10-06 ENCOUNTER — Telehealth: Payer: Self-pay | Admitting: Podiatry

## 2024-10-06 ENCOUNTER — Other Ambulatory Visit: Payer: Self-pay | Admitting: Podiatry

## 2024-10-06 MED ORDER — OXYCODONE-ACETAMINOPHEN 5-325 MG PO TABS: 1.0000 | ORAL_TABLET | ORAL | 0 refills | Status: AC | PRN

## 2024-10-06 NOTE — Telephone Encounter (Signed)
 Patient's spouse called in regards to a prescription for pain medicine. He says that they have been in contact with the [facility] pharmacy in [facility] on [address]. The pharmacy is saying that they are unable to fill the prescription due to the dosage being higher than normal. The pharmacy telephone number is [phone].

## 2024-10-10 ENCOUNTER — Ambulatory Visit: Admitting: Podiatry

## 2024-10-10 ENCOUNTER — Encounter

## 2024-10-10 ENCOUNTER — Ambulatory Visit (INDEPENDENT_AMBULATORY_CARE_PROVIDER_SITE_OTHER)

## 2024-10-10 VITALS — BP 118/79 | HR 65

## 2024-10-10 DIAGNOSIS — M2042 Other hammer toe(s) (acquired), left foot: Secondary | ICD-10-CM | POA: Diagnosis not present

## 2024-10-10 DIAGNOSIS — M21619 Bunion of unspecified foot: Secondary | ICD-10-CM

## 2024-10-10 DIAGNOSIS — M21612 Bunion of left foot: Secondary | ICD-10-CM

## 2024-10-10 DIAGNOSIS — M2041 Other hammer toe(s) (acquired), right foot: Secondary | ICD-10-CM

## 2024-10-10 NOTE — Patient Instructions (Signed)

## 2024-10-10 NOTE — Progress Notes (Unsigned)
 Patient presents for post-op visit today, POV #1 DOS 10/05/2024 LT 2ND METATARSAL OSTEOTOMY, LT 2ND AND 3RD HAMMER TOE REPAIR, LT HALLUX MPJ FUSION .  RN Notes: Patient refused wheelchair in lobby when offered. Stated  I need to do this on my own because I do not have a wheelchair at home. While performing Xray, patient was seated in chair, but reported feeling nauseous and hot. RN asked if she would use wheelchair to go to room now and patient agreed and stated  yes, because I might fall down. Patient was assisted into wheelchair and taken to exam room.   Vital Signs: Today's Vitals   10/10/24 0917  BP: 118/79  Pulse: 65  PainSc: 0-No pain      Radiographs: [x]  Taken []  Not taken  Surgical Site Assessment:  - Dressing:  [x]  Minimal dry blood, intact []  Reinforced   [x]  Changed     -RN Notes: n/a  - Incision:  [x]  CDI (clean, dry, intact)  [x]  Mild erythema  []  Drainage noted   -RN Notes: n/a  - Swelling:  []  None  []  Mild  [x]  Moderate   []  Significant     -RN Notes: n/a  - Bruising:  []  None  [x]  Present: medial aspect left foot.   - Sutures/Staples:  []  None [x]  Intact  []  Removed Today  [x]  Plan to remove at next visit   -Cast/Splint/Pins: []  None [x]  Intact []  Removed Today []  Plan to remove at next visit []  Replaced  -Signs of infection:  [x]  None  []  Present - Describe: n/a  -DME:    []  None [x]  AFW []  Surgical shoe []  Cast  []  Splint  -Walking status:  []  Full WB  []  Partial WB  [x]  NWB  -Utilizing device:  []  None []  Knee Scooter []  Crutches [x]  Wheelchair & walker   DVT assessment:  [x]  Denies symptoms []  Chest pain/SOB []  Pain in calf/redness/warmth   Redressed DSD and ace wrap. Educated on signs of infection, proper dressing care, pain management, and weight bearing status. Patient will contact provider with any new or worsening symptoms. The provider assessed the patient today and reviewed instructions regarding plan of care.  -  Patient seen and  evaluated by myself.  She did report some nausea at today's appointment but she has had this previously (actually had it right before surgery as well).  Her symptoms quickly resolved today.  She said that she been doing well and not any significant pain.  She feels like nothing was done to her foot as she is not having pain.  She does not report any fevers or chills.  Incisions are healing well and she is very happy with the outcome and position of her toes.  There is no surrounding erythema or any signs of infection today.  Sutures intact without any evidence of dehiscence.  Dressing reapplied.  Continue nonweightbearing.  Continue ice, elevate. Monitor for any clinical signs or symptoms of infection and directed to call the office immediately should any occur or go to the ER.  Radiology: Multiple views of the foot were obtained.  Hardware intact without any complicating factors.  No evidence of acute fracture.  Return in about 2 weeks (around 10/24/2024) for F/U already made, please confirm with patient.SABRA Donnice JONELLE Gershon DPM

## 2024-10-15 ENCOUNTER — Other Ambulatory Visit: Payer: Self-pay | Admitting: Family

## 2024-10-15 DIAGNOSIS — E785 Hyperlipidemia, unspecified: Secondary | ICD-10-CM

## 2024-10-20 ENCOUNTER — Encounter: Payer: Self-pay | Admitting: Podiatry

## 2024-10-24 ENCOUNTER — Ambulatory Visit: Admitting: Podiatry

## 2024-10-24 VITALS — Ht 63.0 in | Wt 134.0 lb

## 2024-10-26 NOTE — Progress Notes (Signed)
 Subjective: Chief Complaint  Patient presents with   Routine Post Op    POV #2 DOS 10/05/2024 LT 2ND METATARSAL OSTEOTOMY, LT 2ND AND 3RD HAMMER TOE REPAIR, LT HALLUX MPJ FUSION, pt is here to f/u on left foot after surgery she states everything is going well, pain level is at a minimal, has a tingling sensation at times but nothing major.   68 year old female presents the office above concerns.  [place] that she is doing well and any significant pain.  She states her pain is actually below the sacrum.  She presents today for suture removal. Denies any fevers or chills.   Objective: AAO x3, NAD DP/PT pulses palpable bilaterally, CRT less than 3 seconds Left foot: Incisions well coapted with sutures intact.  There is no evidence of dehiscence.  No surrounding erythema, ascending size but there is no drainage or pus.  No fluctuation or crepitation.  Toes are rectus.  No pain on exam. No pain with calf compression, swelling, warmth, erythema  Assessment: Status post left foot surgery  Plan: -All treatment options discussed with the patient including all alternatives, risks, complications.  -Sutures removed today without complications.  Incisions healing well.  No signs of infection.  Steri-Strips applied followed by dressing.  She can wash the foot with soap and water, dry thoroughly and apply a similar bandage. -She can start to transition to partial weightbearing, although limited in the cam boot as tolerated.  Continue ice, elevate.  -Patient encouraged to call the office with any questions, concerns, change in symptoms.   No follow-ups on file.  Melissa Grimes DPM

## 2024-11-01 ENCOUNTER — Ambulatory Visit

## 2024-11-01 ENCOUNTER — Ambulatory Visit: Admitting: Podiatry

## 2024-11-01 DIAGNOSIS — M2042 Other hammer toe(s) (acquired), left foot: Secondary | ICD-10-CM

## 2024-11-01 DIAGNOSIS — M2041 Other hammer toe(s) (acquired), right foot: Secondary | ICD-10-CM

## 2024-11-01 DIAGNOSIS — M21619 Bunion of unspecified foot: Secondary | ICD-10-CM

## 2024-11-01 NOTE — Progress Notes (Signed)
 Subjective: Chief Complaint  Patient presents with   Routine Post Op    Rm14 POV #3 DOS 10/05/2024 LT 2ND METATARSAL OSTEOTOMY, LT 2ND AND 3RD HAMMER TOE REPAIR, LT HALLUX MPJ FUSION(Candise Crabtree)     68 year old female presents the office above concerns.  Said that she has been doing well.  She denies any pain she has been doing great.  Does not report any fevers or chills.  She has no other concerns today.  Objective: AAO x3, NAD DP/PT pulses palpable bilaterally, CRT less than 3 seconds Left foot: Incisions well coapted without any evidence of dehiscence.  No surrounding erythema, ascending cellulitis.  No drainage or pus.  No signs of infection.  No open lesions identified this time.  Toes are rectus.  K wires intact at the 2nd and 3rd toes but any drainage or purulence. No pain on exam. No pain with calf compression, swelling, warmth, erythema  Assessment: Status post left foot surgery  Plan: -All treatment options discussed with the patient including all alternatives, risks, complications.  -X-rays obtained reviewed.  Hardware intact but uncomplicated factors.  Increased consolidation noted. -She weight-bear as tolerated in cam boot although limited.  Continue ice, elevate.  Compression to help with any residual edema.  Discussed washing foot with soap and water, change the bandage as she feels comfortable. -Monitor for any clinical signs or symptoms of infection and directed to call the office immediately should any occur or go to the ER.  X-ray, pin removal next appointment  Donnice JONELLE Fees DPM

## 2024-11-07 ENCOUNTER — Encounter: Admitting: Podiatry

## 2024-11-21 ENCOUNTER — Encounter: Admitting: Podiatry

## 2024-11-21 ENCOUNTER — Encounter: Payer: Self-pay | Admitting: Podiatry

## 2024-11-21 ENCOUNTER — Ambulatory Visit (INDEPENDENT_AMBULATORY_CARE_PROVIDER_SITE_OTHER)

## 2024-11-21 DIAGNOSIS — M2041 Other hammer toe(s) (acquired), right foot: Secondary | ICD-10-CM

## 2024-11-21 DIAGNOSIS — M2042 Other hammer toe(s) (acquired), left foot: Secondary | ICD-10-CM

## 2024-11-21 DIAGNOSIS — R2681 Unsteadiness on feet: Secondary | ICD-10-CM

## 2024-11-21 DIAGNOSIS — M21619 Bunion of unspecified foot: Secondary | ICD-10-CM

## 2024-11-21 NOTE — Progress Notes (Signed)
 Subjective: Chief Complaint  Patient presents with   Routine Post Op    Rm13 POV #4 DOS 10/05/2024 LT 2ND METATARSAL OSTEOTOMY, LT 2ND AND 3RD HAMMER TOE REPAIR, LT HALLUX MPJ FUSION([name]) Patient says that she feels some numbness and tingling in toes     69 year old female presents the office above concerns.  She presents today for K wire removal.  States that she has been doing great despite having to drive to [place] for the holidays.  No significant pain.  Denies any fevers or chills.  Objective: AAO x3, NAD DP/PT pulses palpable bilaterally, CRT less than 3 seconds Left foot: Incisions well coapted without any evidence of dehiscence.  Incisions healing well as expected postoperatively.  No surrounding erythema, ascending cellulitis.  No drainage or pus.  No signs of infection.  No open lesions identified this time.  Toes are rectus.  K wires intact at the 2nd and 3rd toes but any drainage or purulence. No pain on exam. No pain with calf compression, swelling, warmth, erythema  Assessment: Status post left first MTPJ arthrodesis, hammertoe repair and second shortening metatarsal osteotomy  Plan: -All treatment options discussed with the patient including all alternatives, risks, complications.  -X-rays obtained reviewed.  Multiple views obtained.  Hardware intact but any complicating factors.  K wires intact.  Increased consolidation noted across the arthrodesis site. -K wires removed today in total without complications.  Antibiotic ointment was applied followed by dressing.  Discussed that she can start to wash the foot with soap and water, dry thoroughly apply similar bandage.  Remain in cam boot for now.  - Referral to physical therapy placed to work general lower extremity strengthening.  As she starts physical therapy she can start to gradually transition to a sneaker as tolerated. - Continue ice, elevate. -Monitor for any clinical signs or symptoms of infection and directed  to call the office immediately should any occur or go to the ER.  Return in about 3 weeks (around 12/12/2024) for post-op, x-ray.  Melissa Grimes DPM

## 2024-11-23 ENCOUNTER — Other Ambulatory Visit: Payer: Self-pay | Admitting: Family

## 2024-11-23 ENCOUNTER — Other Ambulatory Visit (HOSPITAL_COMMUNITY)
Admission: RE | Admit: 2024-11-23 | Discharge: 2024-11-23 | Disposition: A | Discharge status: Home / Self Care | Source: Ambulatory Visit | Attending: Family | Admitting: Family

## 2024-11-23 ENCOUNTER — Ambulatory Visit (INDEPENDENT_AMBULATORY_CARE_PROVIDER_SITE_OTHER): Admitting: Family

## 2024-11-23 ENCOUNTER — Encounter: Payer: Self-pay | Admitting: Family

## 2024-11-23 VITALS — BP 150/100 | HR 63 | Temp 97.6°F | Ht 63.0 in | Wt 130.3 lb

## 2024-11-23 DIAGNOSIS — Z7251 High risk heterosexual behavior: Secondary | ICD-10-CM | POA: Diagnosis not present

## 2024-11-23 DIAGNOSIS — I10 Essential (primary) hypertension: Secondary | ICD-10-CM

## 2024-11-23 DIAGNOSIS — B009 Herpesviral infection, unspecified: Secondary | ICD-10-CM

## 2024-11-23 DIAGNOSIS — E785 Hyperlipidemia, unspecified: Secondary | ICD-10-CM | POA: Diagnosis not present

## 2024-11-23 DIAGNOSIS — Z113 Encounter for screening for infections with a predominantly sexual mode of transmission: Secondary | ICD-10-CM | POA: Insufficient documentation

## 2024-11-23 LAB — COMPREHENSIVE METABOLIC PANEL WITH GFR
ALT: 19 U/L (ref 3–35)
AST: 24 U/L (ref 5–37)
Albumin: 4.4 g/dL (ref 3.5–5.2)
Alkaline Phosphatase: 99 U/L (ref 39–117)
BUN: 19 mg/dL (ref 6–23)
CO2: 29 meq/L (ref 19–32)
Calcium: 9.7 mg/dL (ref 8.4–10.5)
Chloride: 103 meq/L (ref 96–112)
Creatinine, Ser: 0.79 mg/dL (ref 0.40–1.20)
GFR: 75.98 mL/min
Glucose, Bld: 99 mg/dL (ref 70–99)
Potassium: 4.3 meq/L (ref 3.5–5.1)
Sodium: 138 meq/L (ref 135–145)
Total Bilirubin: 0.5 mg/dL (ref 0.2–1.2)
Total Protein: 6.7 g/dL (ref 6.0–8.3)

## 2024-11-23 LAB — LIPID PANEL
Cholesterol: 134 mg/dL (ref 28–200)
HDL: 53.6 mg/dL
LDL Cholesterol: 61 mg/dL (ref 10–99)
NonHDL: 80.35
Total CHOL/HDL Ratio: 2
Triglycerides: 96 mg/dL (ref 10.0–149.0)
VLDL: 19.2 mg/dL (ref 0.0–40.0)

## 2024-11-23 MED ORDER — EMTRICITABINE-TENOFOVIR DF 200-300 MG PO TABS: 1.0000 | ORAL_TABLET | Freq: Every day | ORAL | 0 refills | Status: AC

## 2024-11-23 NOTE — Patient Instructions (Signed)
" °  VISIT SUMMARY: Today, you came in for a follow-up visit to manage your blood pressure and discuss other health concerns. We reviewed your current medications and discussed your recent health experiences, including your trip to [facility] and your recovery from foot surgery.  YOUR PLAN: -HIV PRE-EXPOSURE PROPHYLAXIS MANAGEMENT: You are continuing with Truvada for HIV prevention and have had no recent herpes outbreaks. We performed a vaginal swab and ordered lab work, including tests for HIV and other sexually transmitted diseases. We will see you again in three months to monitor your progress with Truvada.  -PRIMARY HYPERTENSION: Your blood pressure was elevated today. Hypertension means your blood pressure is higher than normal, which can lead to health problems if not managed. We have increased your lisinopril  dose to 30 mg daily. Please come back in two weeks for a nurse visit to recheck your blood pressure.  -DYSLIPIDEMIA: Dyslipidemia means you have abnormal levels of cholesterol or other fats in your blood. We have ordered lab work to check your cholesterol levels as you requested.  -ORTHOPEDIC POSTOPERATIVE CARE, LEFT FOOT: You are recovering from foot surgery and recently had pins removed. You will start physical therapy on Wednesday to aid in your recovery.  -GENERAL HEALTH MAINTENANCE: We have ordered comprehensive lab work, including cholesterol levels, to monitor your overall health.  INSTRUCTIONS: Please return in two weeks for a nurse visit to recheck your blood pressure. Additionally, we will see you in three months for a follow-up on your Truvada treatment. Start your physical therapy on Wednesday as planned.                                         "

## 2024-11-23 NOTE — Assessment & Plan Note (Signed)
 Continues truvada.  Update HIV.

## 2024-11-23 NOTE — Assessment & Plan Note (Addendum)
 Lab Results  Component Value Date   CHOL 154 01/04/2024   HDL 57.80 01/04/2024   LDLCALC 57 01/04/2024   LDLDIRECT 102.0 08/06/2015   TRIG 193.0 (H) 01/04/2024   CHOLHDL 3 01/04/2024   Maintained on lipitor 20 mg. Update lipid panel at pt request.

## 2024-11-23 NOTE — Assessment & Plan Note (Signed)
 On valtrex  for prophylaxis.  No outbreaks.  Continue same.

## 2024-11-23 NOTE — Progress Notes (Signed)
 "  Subjective:     Patient ID: Melissa Grimes, female    DOB: [birth year], 69 y.o.   MRN: [id number]  Chief Complaint  Patient presents with   Follow-up    3 month follow up    HPI  Discussed the use of AI scribe software for clinical note transcription with the patient, who gave verbal consent to proceed.  History of Present Illness Caliann Leckrone is a 69 year old female with hypertension who presents for follow-up and blood pressure management.  She has a history of hypertension and is currently taking lisinopril  20 mg daily. Her blood pressure was initially 136/92 mmHg and later 150/100 mmHg. She is interested in having her cholesterol and other lab work checked and has fasted in preparation for these tests.  She is currently taking Valtrex  500 mg daily, which she takes consistently at the same time each day. No episodes of herpes outbreaks.  During the holidays, she experienced an illness after her partner, Octaviano, had chills and a high fever. Despite this, she was able to drive to and from [facility], where her family resides, taking a southern route to avoid snow. She recently had pins removed from her left foot and is grateful for the improvement.      Health Maintenance Due  Topic Date Due   COVID-19 Vaccine (8 - 2025-26 season) 07/18/2024    Past Medical History:  Diagnosis Date   Arthritis    Complication of anesthesia    difficulty urinating after   Hyperlipidemia    Hypertension    Macular degeneration    Osteopenia    Trochanteric bursitis, right hip 10/11/2018    Past Surgical History:  Procedure Laterality Date   BUNIONECTOMY     CESAREAN SECTION     x 2   HERNIA REPAIR Left 11/18/1975   inguinal   OSTEOTOMY  1974 & 1975   toe removal Right 12/08/2021   right second toe   TOTAL HIP ARTHROPLASTY Right 01/18/2013   Procedure: Right TOTAL HIP ARTHROPLASTY ANTERIOR APPROACH;  Surgeon: Lonni CINDERELLA Poli, MD;  Location: [facility] OR;  Service:  Orthopedics;  Laterality: Right;   TOTAL HIP ARTHROPLASTY Left 08/09/2013   Procedure: LEFT TOTAL HIP ARTHROPLASTY ANTERIOR APPROACH;  Surgeon: Lonni CINDERELLA Poli, MD;  Location: [facility] OR;  Service: Orthopedics;  Laterality: Left;    Family History  Problem Relation Age of Onset   Arthritis Mother        osteoarthritis   Hyperlipidemia Mother    Heart disease Mother    Hypertension Mother    Hypothyroidism Mother    Hyperlipidemia Father    Heart disease Father    Stroke Father    Parkinson's disease Father    Obesity Sister        overweight   Hypertension Sister    Cancer Maternal Grandfather        lung   Varicose Veins Child    Asthma Child    Migraines Child    Cancer Cousin        breast    Social History   Socioeconomic History   Marital status: Married    Spouse name: Not on file   Number of children: 2   Years of education: Not on file   Highest education level: Associate degree: occupational, scientist, product/process development, or vocational program  Occupational History   Not on file  Tobacco Use   Smoking status: Never   Smokeless tobacco: Never  Vaping Use  Vaping status: Never Used  Substance and Sexual Activity   Alcohol use: Yes    Alcohol/week: 3.0 standard drinks of alcohol    Types: 3 Glasses of wine per week    Comment: 1-3   Drug use: No   Sexual activity: Yes    Partners: Male  Other Topics Concern   Not on file  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 [employer]    children- grown both in [place]    Enjoys writing christian ed scientist, clinical (histocompatibility and immunogenetics)   Social Drivers of Health   Tobacco Use: Low Risk (11/23/2024)   Patient History    Smoking Tobacco Use: Never    Smokeless Tobacco Use: Never    Passive Exposure: Not on file  Financial Resource Strain: Low Risk (08/30/2024)   Overall Financial Resource Strain (CARDIA)    Difficulty of Paying Living Expenses: Not hard at all  Food Insecurity: No Food Insecurity  (08/30/2024)   Epic    Worried About Programme Researcher, Broadcasting/film/video in the Last Year: Never true    Ran Out of Food in the Last Year: Never true  Transportation Needs: No Transportation Needs (08/30/2024)   Epic    Lack of Transportation (Medical): No    Lack of Transportation (Non-Medical): No  Physical Activity: Sufficiently Active (08/30/2024)   Exercise Vital Sign    Days of Exercise per Week: 2 days    Minutes of Exercise per Session: 90 min  Stress: No Stress Concern Present (08/30/2024)   [employer] of Occupational Health - Occupational Stress Questionnaire    Feeling of Stress: Only a little  Social Connections: Socially Integrated (08/30/2024)   Social Connection and Isolation Panel    Frequency of Communication with Friends and Family: Twice a week    Frequency of Social Gatherings with Friends and Family: More than three times a week    Attends Religious Services: More than 4 times per year    Active Member of Clubs or Organizations: Yes    Attends Banker Meetings: More than 4 times per year    Marital Status: Married  Catering Manager Violence: Not At Risk (07/13/2024)   Epic    Fear of Current or Ex-Partner: No    Emotionally Abused: No    Physically Abused: No    Sexually Abused: No  Depression (PHQ2-9): Low Risk (11/23/2024)   Depression (PHQ2-9)    PHQ-2 Score: 3  Alcohol Screen: Low Risk (08/30/2024)   Alcohol Screen    Last Alcohol Screening Score (AUDIT): 2  Housing: Unknown (08/30/2024)   Epic    Unable to Pay for Housing in the Last Year: No    Number of Times Moved in the Last Year: Not on file    Homeless in the Last Year: No  Utilities: Not At Risk (07/13/2024)   Epic    Threatened with loss of utilities: No  Health Literacy: Adequate Health Literacy (07/13/2024)   B1300 Health Literacy    Frequency of need for help with medical instructions: Never    Outpatient Medications Prior to Visit  Medication Sig Dispense Refill   aspirin  EC 81 MG  tablet Take 81 mg by mouth daily.     atorvastatin  (LIPITOR) 20 MG tablet TAKE 1 TABLET BY MOUTH ONCE  DAILY 100 tablet 2   lisinopril  (ZESTRIL ) 20 MG tablet TAKE 1 TABLET BY MOUTH DAILY 100 tablet 2   meloxicam  (MOBIC ) 7.5 MG tablet Take 1  tablet (7.5 mg total) by mouth 2 (two) times daily as needed for pain. 30 tablet 2   Multiple Vitamins-Minerals (CENTRUM SILVER 50+WOMEN PO) Take 1 tablet by mouth daily.     Multiple Vitamins-Minerals (CITRACAL +D3 PO) Take 1 tablet by mouth in the morning and at bedtime. 1200mg  slow release     Omega-3 Fatty Acids (FISH OIL ) 1000 MG CAPS Take 1 capsule by mouth daily.     valACYclovir  (VALTREX ) 500 MG tablet Take 1 tablet (500 mg total) by mouth daily. 90 tablet 1   emtricitabine -tenofovir  (TRUVADA) 200-300 MG tablet Take 1 tablet by mouth daily. 90 tablet 0   Multiple Vitamins-Minerals (PRESERVISION AREDS 2) CAPS Take 1 capsule by mouth in the morning and at bedtime. (Patient not taking: Reported on 11/23/2024)     No facility-administered medications prior to visit.    Allergies[1]  ROS    See HPI  Objective:    Physical Exam Constitutional:      General: She is not in acute distress.    Appearance: Normal appearance. She is well-developed.  HENT:     Head: Normocephalic and atraumatic.     Right Ear: External ear normal.     Left Ear: External ear normal.  Eyes:     General: No scleral icterus. Neck:     Thyroid : No thyromegaly.  Cardiovascular:     Rate and Rhythm: Normal rate.  Pulmonary:     Effort: Pulmonary effort is normal.  Genitourinary:    General: Normal vulva.  Musculoskeletal:     Cervical back: Neck supple.  Skin:    General: Skin is warm and dry.  Neurological:     Mental Status: She is alert and oriented to person, place, and time.  Psychiatric:        Mood and Affect: Mood normal.        Behavior: Behavior normal.        Thought Content: Thought content normal.        Judgment: Judgment normal.      BP (!)  150/100   Pulse 63   Temp 97.6 F (36.4 C) (Oral)   Ht 5' 3 (1.6 m)   Wt 130 lb 4.8 oz (59.1 kg)   LMP 11/17/2010   SpO2 99%   BMI 23.08 kg/m  Wt Readings from Last 3 Encounters:  11/23/24 130 lb 4.8 oz (59.1 kg)  10/24/24 134 lb (60.8 kg)  08/16/24 134 lb (60.8 kg)       Assessment & Plan:   Problem List Items Addressed This Visit       Unprioritized   HTN (hypertension)   Maintained on lisinopril .        HSV-2 (herpes simplex virus 2) infection   On valtrex  for prophylaxis.  No outbreaks.  Continue same.       Relevant Medications   emtricitabine -tenofovir  (TRUVADA) 200-300 MG tablet   High risk sexual behavior   Continues truvada.  Update HIV.        Relevant Medications   emtricitabine -tenofovir  (TRUVADA) 200-300 MG tablet   Dyslipidemia   Lab Results  Component Value Date   CHOL 154 01/04/2024   HDL 57.80 01/04/2024   LDLCALC 57 01/04/2024   LDLDIRECT 102.0 08/06/2015   TRIG 193.0 (H) 01/04/2024   CHOLHDL 3 01/04/2024   Maintained on lipitor 20 mg. Update lipid panel at pt request.       Relevant Orders   Comp Met (CMET)   Lipid panel   Other Visit  Diagnoses       Screening examination for STD (sexually transmitted disease)    -  Primary   Relevant Orders   HepB+HepC+HIV Panel   RPR W/RFLX TO RPR TITER, TREPONEMAL AB, SCREEN AND DIAGNOSIS   Cervicovaginal ancillary only( [place])       I am having Teshara E. Chagnon Joy maintain her aspirin  EC, Fish Oil , meloxicam , PreserVision AREDS 2, Multiple Vitamins-Minerals (CITRACAL +D3 PO), Multiple Vitamins-Minerals (CENTRUM SILVER 50+WOMEN PO), lisinopril , valACYclovir , atorvastatin , and emtricitabine -tenofovir .  Meds ordered this encounter  Medications   emtricitabine -tenofovir  (TRUVADA) 200-300 MG tablet    Sig: Take 1 tablet by mouth daily.    Dispense:  90 tablet    Refill:  0    Needs OV prior to additional refills.      [1]  Allergies Allergen Reactions   Achromycin  [Tetracycline] Hives   Tetracycline Hcl    "

## 2024-11-23 NOTE — Assessment & Plan Note (Signed)
Maintained on lisinopril.

## 2024-11-24 LAB — HEPB+HEPC+HIV PANEL
HIV Screen 4th Generation wRfx: NONREACTIVE
Hep B C IgM: NEGATIVE
Hep B Core Total Ab: NEGATIVE
Hep B E Ab: NONREACTIVE
Hep B E Ag: NEGATIVE
Hep B Surface Ab, Qual: REACTIVE
Hep C Virus Ab: NONREACTIVE
Hepatitis B Surface Ag: NEGATIVE

## 2024-11-24 LAB — CERVICOVAGINAL ANCILLARY ONLY
Chlamydia: NEGATIVE
Comment: NEGATIVE
Comment: NEGATIVE
Comment: NORMAL
Neisseria Gonorrhea: NEGATIVE
Trichomonas: NEGATIVE

## 2024-11-24 LAB — SYPHILIS: RPR W/REFLEX TO RPR TITER AND TREPONEMAL ANTIBODIES, TRADITIONAL SCREENING AND DIAGNOSIS ALGORITHM: RPR Ser Ql: NONREACTIVE

## 2024-11-25 ENCOUNTER — Ambulatory Visit: Payer: Self-pay | Admitting: Family

## 2024-11-29 NOTE — Therapy (Signed)
 " OUTPATIENT PHYSICAL THERAPY LOWER EXTREMITY EVALUATION   Patient Name: Melissa Grimes MRN: [id number] DOB:05/17/1956, 69 y.o., female Today's Date: 11/30/2024   END OF SESSION:  PT End of Session - 11/30/24 1403     Visit Number 1    Date for Recertification  01/25/25    PT Start Time 1401    PT Stop Time 1445    PT Time Calculation (min) 44 min    Activity Tolerance Patient tolerated treatment well;No increased pain    Behavior During Therapy [facility] for tasks assessed/performed          Past Medical History:  Diagnosis Date   Arthritis    Complication of anesthesia    difficulty urinating after   Hyperlipidemia    Hypertension    Macular degeneration    Osteopenia    Trochanteric bursitis, right hip 10/11/2018   Past Surgical History:  Procedure Laterality Date   BUNIONECTOMY     CESAREAN SECTION     x 2   HERNIA REPAIR Left 11/18/1975   inguinal   OSTEOTOMY  1974 & 1975   toe removal Right 12/08/2021   right second toe   TOTAL HIP ARTHROPLASTY Right 01/18/2013   Procedure: Right TOTAL HIP ARTHROPLASTY ANTERIOR APPROACH;  Surgeon: Lonni CINDERELLA Poli, MD;  Location: [facility] OR;  Service: Orthopedics;  Laterality: Right;   TOTAL HIP ARTHROPLASTY Left 08/09/2013   Procedure: LEFT TOTAL HIP ARTHROPLASTY ANTERIOR APPROACH;  Surgeon: Lonni CINDERELLA Poli, MD;  Location: [facility] OR;  Service: Orthopedics;  Laterality: Left;   Patient Active Problem List   Diagnosis Date Noted   HSV-2 (herpes simplex virus 2) infection 11/23/2024   Bunion of great toe of left foot 08/17/2024   Insect bite of left hand 04/26/2024   Atrophic vaginitis 10/13/2023   Gross hematuria 09/01/2023   Osteopenia    Lumbar radiculopathy 06/02/2023   Synovial cyst of right popliteal space 06/02/2023   COVID-19 virus infection 01/20/2022   Acute pain of left knee 10/22/2021   High risk sexual behavior 08/09/2021   History of motion sickness 08/09/2021   S/P amputation of lesser toe, right  12/05/2020   Bunion, right foot 10/25/2020   History of total right hip arthroplasty 10/11/2018   Allergic rhinitis 04/02/2016   Multinodular goiter 12/26/2015   LGSIL on Pap smear of cervix 09/10/2015   Abnormal Pap smear of cervix 08/08/2015   Cerebral infarction (HCC) 08/24/2013   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   Dyslipidemia 08/14/2013   Preventative health care 12/22/2012   HTN (hypertension) 12/01/2012   Hyperlipidemia 12/01/2012   Fibroid uterus 12/01/2012   OA (osteoarthritis) 12/01/2012    PCP: Daryl Setter, NP   REFERRING PROVIDER: Gershon Donnice SAUNDERS, DPM   REFERRING DIAG: M20.41,M20.42 (ICD-10-CM) - Hammertoes of both feet M21.619 (ICD-10-CM) - Bunion R26.81 (ICD-10-CM) - Gait instability  THERAPY DIAG:  Difficulty in walking, not elsewhere classified  Muscle weakness (generalized)  Pain in left toe(s)  Stiffness of left foot, not elsewhere classified  RATIONALE FOR EVALUATION AND TREATMENT: Rehabilitation  ONSET DATE: 10/05/24  NEXT MD VISIT:    SUBJECTIVE:  SUBJECTIVE STATEMENT: 69 y/o patient referred to PT for the following: LT 2ND METATARSAL OSTEOTOMY, LT 2ND AND 3RD HAMMER TOE REPAIR, LT HALLUX MPJ FUSION.   Status post left first MTPJ arthrodesis, hammertoe repair and second shortening metatarsal osteotomy. The following is an excerpt from Dr Jameson last office note.SABRASABRASABRAReferral to physical therapy placed to work general lower extremity strengthening. As she starts physical therapy she can start to gradually transition to a sneaker as tolerated.     Patient reports having L great toe/bunion pain for at least a year that only continued to get worse.  She opted for the surgical repair which was done on 10/05/24 so she  is almost 2 months post op now.   Her incisions are all healed.   She got her K wires out of the 2nd and 3rd toe about a week ago and the sites are scabbed over now.    She feels the surgery was a great success.   States she had terrible pain before surgery and minimal pain since surgery.   She has no resting pain or pain with activity now per her report.   She only gets a little pain during today's visit with gentle toe ROM.   She has worn the short leg walking boot now for the last 2 months and has not attempted any gait out of the boot.  She is even wearing it at night.  However, she is ready to try to get out of it and start wearing tennis shoes again.   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   She is normally very mobile, active, travels, etc.    PAIN: Are you having pain? Yes: NPRS scale: 0/10 resting;  1-2/10 with gentle passive toe extension stretching Pain location: L foot Pain description: sore Aggravating factors: toe ROM Relieving factors: keeping toes in stable position  PERTINENT HISTORY:  OA, HTN, osteopenia, herpes simplex 2 viral infection, lumbar radiculopathy  PRECAUTIONS: Other: may gradually transition out of the CAM boot into regular tennis shoes  RED FLAGS: None  WEIGHT BEARING RESTRICTIONS: No  FALLS:  Has patient fallen in last 6 months? 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 09/22/2024 08:12:16 AM   TECHNIQUE: Multiplanar multisequence MRI of the left forefoot  was performed without the administration of intravenous contrast.   COMPARISON: Radiographs 08/15/2024.   CLINICAL HISTORY: Foot pain, chronic, erosive osteoarthritis suspected, xray done. Chronic left great toe pain for several years with worsening pain. Previous surgery.   FINDINGS:   LISFRANC JOINT: Visualized Lisfranc ligament is intact. No significant Lisfranc interval widening or significant periligamentous edema.   BONE MARROW: No evidence of acute fracture, dislocation, or aggressive osseous lesion.   GREATER AND LESSER MTP JOINTS: There are advanced degenerative changes at the 1st metatarsal phalanx joint with a hallux valgus deformity and small joint effusion. No definite erosive changes are identified. There are probable postsurgical changes distally within the 1st and 2nd metatarsals. Mild degenerative changes are present at the 2nd metatarsophalangeal joint, without erosive changes or  effusion. There are mild degenerative changes within the midfoot with osteophytes and subchondral cyst formation involving the metatarsal bases and adjacent cuneiform bones.   SOFT TISSUES: No significant soft tissue edema or fluid collections.   TENDONS: The flexor digitorum longus tendon of the second toe is poorly visualized distal to the metatarsophalangeal joint and is likely torn. There is a small amount of fluid within the flexor tendon sheath of the second toe. The additional forefoot muscles and tendons appear intact.   IMPRESSION: 1. Advanced degenerative changes at the first metatarsophalangeal joint with associated hallux valgus deformity and small joint effusion. No definite erosive changes identified. 2. Likely tear of the flexor digitorum longus tendon of the second toe distal to the metatarsophalangeal joint, with a small amount of fluid within the flexor tendon sheath. Correlate clinically. 3. Mild degenerative changes within the midfoot with osteophytes  and subchondral cyst formation involving the metatarsal bases and adjacent cuneiform bones. The Lisfranc ligament is intact. 4. Probable postsurgical changes distally within the first and second metatarsals.   Electronically signed by: Elsie Perone MD 09/22/2024 08:33 AM EST RP Workstation: [id number]  PATIENT SURVEYS:  LEFS  Extreme difficulty/unable (0), Quite a bit of difficulty (1), Moderate difficulty (2), Little difficulty (3), No difficulty (4) Survey date:  11/30/24  Any of your usual work, housework or school activities 4  2. Usual hobbies, recreational or sporting activities 4  3. Getting into/out of the bath 4  4. Walking between rooms 4  5. Putting on socks/shoes 4  6. Squatting  4  7. Lifting an object, like a bag of groceries from the floor 4  8. Performing light activities around your home 4  9. Performing heavy activities around your home 4  10. Getting into/out of a car 4  11. Walking 2 blocks 2  12. Walking 1 mile 2  13. Going up/down 10 stairs (1 flight) 4  14. Standing for 1 hour 4  15.  sitting for 1 hour 4  16. Running on even ground 1  17. Running on uneven ground 0  18. Making sharp turns while running fast 1  19. Hopping  0  20. Rolling over in bed 4  Score total:  62/80    ABC scale: The Activities-Specific Balance Confidence (ABC) Scale 0% 10 20 30  40 50 60 70 80 90 100% No confidence<->completely confident  How confident are you that you will not lose your balance or become unsteady when you . . .   Date tested 11/30/24  Walk around the house 100%  2. Walk up or down stairs 100%  3. Bend over and pick up a slipper from in front of a closet floor 100%  4. Reach for a small can off a shelf at eye level 100%  5. Stand on tip toes and reach for something above your head 100%  6. Stand on a chair and reach for something 100%  7. Sweep the floor 100%  8. Walk outside the house to a car parked in the driveway 100%  9. Get into or out of a car  100%  10. Walk across a parking lot to the mall 100%  11. Walk up or down a ramp 100%  12. Walk in a crowded mall where people rapidly walk past you 100%  13. Are bumped into by people as you walk through the mall 100%  14. Step onto or off of an escalator while you are holding onto the railing 100%  15.  Step onto or off an escalator while holding onto parcels such that you cannot hold onto the railing 100%  16. Walk outside on icy sidewalks 50%  Total: #/16 1550/1600      COGNITION: Overall cognitive status: Within functional limits for tasks assessed    SENSATION: WFL  EDEMA:  Minimal to none in the forefoot area  POSTURE:  Leftward Scoliosis, higher R hip  PALPATION: A little TTP over the 1st MTP and 2nd/3rd toes  MUSCLE LENGTH: Heelcord: a little tight on the LLE  LOWER EXTREMITY ROM:  Active ROM Right eval Left eval  Hip flexion    Hip extension    Hip abduction    Hip adduction    Hip internal rotation    Hip external rotation    Knee flexion    Knee extension    Ankle dorsiflexion  4  Ankle plantarflexion  65  Ankle inversion  60  Ankle eversion  30   Passive ROM Right eval Left eval  Hip flexion    Hip extension    Hip abduction    Hip adduction    Hip internal rotation    Hip external rotation    Knee flexion    Knee extension    Ankle dorsiflexion    Ankle plantarflexion    Ankle inversion    Ankle eversion    (Blank rows = not tested)            LOWER EXTREMITY MMT:  MMT Right eval Left eval  Hip flexion    Hip extension    Hip abduction    Hip adduction    Hip internal rotation    Hip external rotation    Knee flexion    Knee extension    Ankle dorsiflexion  5  Ankle plantarflexion  NT  Ankle inversion  4+  Ankle eversion  4   (Blank rows = not tested)  LOWER EXTREMITY SPECIAL TESTS:  N/a  FUNCTIONAL TESTS:  TBD  GAIT: Distance walked: into clinic Assistive device utilized: None Level of assistance: Complete  Independence Gait pattern: slow, limping mainly due to the short leg walking boot on the LLE Comments:    TODAY'S TREATMENT:  11/30/24 SELF CARE: Provided education on PT POC progression, initial HEP    PATIENT EDUCATION:  Education details: PT eval findings, anticipated POC, and initial HEP  Person educated: Patient Education method: Explanation, Demonstration, Verbal cues, Tactile cues, Handouts, and MedBridgeGO app access provided Education comprehension: verbalized understanding, verbal cues required, tactile cues required, and needs further education  HOME EXERCISE PROGRAM: Access Code: [email or web address] URL: [email or web address] Date: 11/30/2024 Prepared by: Garnette Montclair  Exercises - Seated Heel Raise  - 1 x daily - 7 x weekly - 3 sets - 10 reps - Seated Toe Raise  - 1 x daily - 7 x weekly - 3 sets - 10 reps - Seated Ankle Alphabet  - 1 x daily - 7 x weekly - 3 sets - 10 reps - Towel Scrunches  - 1 x daily - 7 x weekly - 3 sets - 10 reps - Ankle Inversion with Resistance  - 1 x daily - 7 x weekly - 3 sets - 10 reps - Ankle Eversion with Resistance  - 1 x daily - 7 x weekly - 3 sets - 10 reps   ASSESSMENT:  CLINICAL IMPRESSION: Melissa Grimes is a 69 y.o. female who was referred to physical therapy for evaluation and treatment for Status post left first MTPJ  arthrodesis, hammertoe repair and second shortening metatarsal osteotomy on 10/05/24 by Dr Gershon.    Patient reports she suffered with the L foot pain for at least a year before she finally went ahead and got the surgery done.   She is very pleased with the surgical outcome and is not having any pain now.   The only time she gets any discomfort during today's visit is with gentle toe extension.  Her incisions are all healed with no drainage/redness/dehischence noted.   She is ready to get into her tennis shoes again.   She is advised that she might need to buy 1 size larger to start with if she can't get into  her usual shoes.   She is advised to only wear tennis shoes and not anything else; especially heels.   She is advised to only wear them for an hour or two at a time to start with and return to boot if pain occurs.   I feel like she is going to be fine with ambulation and only plan to see her for 3 PT visits unless she starts having problems.    Patient has mild deficits in L ankle dorsiflexion ROM, L ankle strength, and gait/balance which are interfering with ADLs and are impacting quality of life.  On LEFS patient scored 62/80 demonstrating minimal/mild functional limitation.  Melissa Grimes will benefit from skilled PT to address above deficits to improve mobility and activity tolerance with decreased pain interference.  OBJECTIVE IMPAIRMENTS: difficulty walking, decreased ROM, and decreased strength.   ACTIVITY LIMITATIONS: locomotion level  PARTICIPATION LIMITATIONS: community activity  PERSONAL FACTORS: Age, Fitness, and 1-2 comorbidities: OA, HTN, osteopenia, herpes simplex 2 viral infection, lumbar radiculopathy are also affecting patient's functional outcome.   REHAB POTENTIAL: Good  CLINICAL DECISION MAKING: Evolving/moderate complexity  EVALUATION COMPLEXITY: Moderate   GOALS: Goals reviewed with patient? Yes  SHORT TERM GOALS: Target date: 12/28/2024   Patient will be independent with initial HEP. Baseline: 100% PT assist required for correct completion Goal status: INITIAL  2.  Patient will report at least 25% improvement in L ankle/foot pain to improve QOL. Baseline: 1-2/10 with gentle toe ROM  Goal status: INITIAL     LONG TERM GOALS: Target date: 01/25/2025   Patient will be independent with advanced/ongoing HEP to improve outcomes and carryover.  Baseline: no advanced HEP yet Goal status: INITIAL  2.  Patient will report at least 50-75% improvement in L ankle/foot pain to improve QOL. Baseline: 1-2/10 with gentle toe rom Goal status: INITIAL  3.  Patient will  demonstrate improved L ankle AROM to WNL to allow for normal gait and stair mechanics. Baseline: Refer to above LE ROM table Goal status: INITIAL  4.  Patient will demonstrate improved LLE strength to >/= 5/5 for improved stability and ease of mobility. Baseline: Refer to above LE MMT table Goal status: INITIAL  5.  Patient will be able to ambulate10-20' with regular tennis shoes and normal gait pattern without increased foot/ankle pain to access community.  Baseline: wearing boot LLE at this time Goal status: INITIAL   7.  Patient will report >/= 72/80 on LEFS (MCID = 9 pts) to demonstrate improved functional ability. Baseline: 62/80 Goal status: INITIAL  8.  Patient will demonstrate at least 19/24 on DGI to decrease risk of falls. Baseline: TBD Goal status: INITIAL   PLAN:  PT FREQUENCY: 1-2x/week  PT DURATION: 8 weeks  PLANNED INTERVENTIONS: 97750- Physical Performance Testing, 97110-Therapeutic exercises, 97530- Therapeutic activity, 97112-  Neuromuscular re-education, [phone]- Self Care, 02859- Manual therapy, [phone]- Gait training, [phone]- Electrical stimulation (unattended), 97016- Vasopneumatic device, [phone] (1-2 muscles), 20561 (3+ muscles)- Dry Needling, Patient/Family education, Balance training, Stair training, Taping, Joint mobilization, Cryotherapy, and Moist heat  PLAN FOR NEXT SESSION: check how HEP is doing;   advance to standing HEP if able;  check balance and gait in tennis shoes   Melissa Grimes, PT 11/30/2024, 9:23 PM  "

## 2024-11-30 ENCOUNTER — Ambulatory Visit: Attending: Podiatry | Admitting: Rehabilitation

## 2024-11-30 ENCOUNTER — Encounter: Payer: Self-pay | Admitting: Rehabilitation

## 2024-11-30 ENCOUNTER — Other Ambulatory Visit: Payer: Self-pay

## 2024-11-30 DIAGNOSIS — M2042 Other hammer toe(s) (acquired), left foot: Secondary | ICD-10-CM | POA: Diagnosis not present

## 2024-11-30 DIAGNOSIS — M79675 Pain in left toe(s): Secondary | ICD-10-CM | POA: Diagnosis present

## 2024-11-30 DIAGNOSIS — M6281 Muscle weakness (generalized): Secondary | ICD-10-CM | POA: Diagnosis present

## 2024-11-30 DIAGNOSIS — R262 Difficulty in walking, not elsewhere classified: Secondary | ICD-10-CM | POA: Diagnosis present

## 2024-11-30 DIAGNOSIS — R2681 Unsteadiness on feet: Secondary | ICD-10-CM | POA: Diagnosis not present

## 2024-11-30 DIAGNOSIS — M21619 Bunion of unspecified foot: Secondary | ICD-10-CM | POA: Insufficient documentation

## 2024-11-30 DIAGNOSIS — M2041 Other hammer toe(s) (acquired), right foot: Secondary | ICD-10-CM | POA: Diagnosis not present

## 2024-11-30 DIAGNOSIS — M25675 Stiffness of left foot, not elsewhere classified: Secondary | ICD-10-CM | POA: Diagnosis present

## 2024-12-07 ENCOUNTER — Other Ambulatory Visit

## 2024-12-07 ENCOUNTER — Ambulatory Visit: Admitting: Family

## 2024-12-07 VITALS — BP 126/82 | HR 70

## 2024-12-07 DIAGNOSIS — I10 Essential (primary) hypertension: Secondary | ICD-10-CM

## 2024-12-07 LAB — BASIC METABOLIC PANEL WITH GFR
BUN: 20 mg/dL (ref 6–23)
CO2: 31 meq/L (ref 19–32)
Calcium: 9.8 mg/dL (ref 8.4–10.5)
Chloride: 103 meq/L (ref 96–112)
Creatinine, Ser: 0.87 mg/dL (ref 0.40–1.20)
GFR: 67.66 mL/min
Glucose, Bld: 87 mg/dL (ref 70–99)
Potassium: 4.7 meq/L (ref 3.5–5.1)
Sodium: 140 meq/L (ref 135–145)

## 2024-12-07 MED ORDER — LISINOPRIL 30 MG PO TABS: 30.0000 mg | ORAL_TABLET | Freq: Every day | ORAL | 1 refills | Status: AC

## 2024-12-07 NOTE — Progress Notes (Signed)
 Pt here for Blood pressure check per Eleanor Ponto, NP.  Pt currently takes: lisinopril  30mg   Pt reports compliance with medication.  BP Readings from Last 3 Encounters:  11/23/24 (!) 150/100  10/10/24 118/79  08/16/24 115/76    BP today @ =126/82 HR =70  Pt advised per Melissa blood pressure looks good continue same dose of lisinopril  and check BMP.  Pt sent to lab.

## 2024-12-08 ENCOUNTER — Ambulatory Visit: Payer: Self-pay | Admitting: Family

## 2024-12-12 ENCOUNTER — Encounter: Admitting: Podiatry

## 2024-12-14 ENCOUNTER — Ambulatory Visit: Admitting: Rehabilitation

## 2024-12-28 ENCOUNTER — Ambulatory Visit: Admitting: Rehabilitation

## 2025-01-02 ENCOUNTER — Ambulatory Visit: Admitting: Podiatry

## 2025-02-22 ENCOUNTER — Ambulatory Visit: Admitting: Family

## 2025-07-18 ENCOUNTER — Ambulatory Visit
